# Patient Record
Sex: Female | Born: 1940 | Race: White | Hispanic: No | Smoking: Never smoker
Health system: Southern US, Community
[De-identification: ages and names within clinical notes are randomized; demographics above are authoritative.]

## PROBLEM LIST (undated history)

## (undated) DIAGNOSIS — M1990 Unspecified osteoarthritis, unspecified site: Secondary | ICD-10-CM

## (undated) DIAGNOSIS — M48061 Spinal stenosis, lumbar region without neurogenic claudication: Secondary | ICD-10-CM

## (undated) DIAGNOSIS — K449 Diaphragmatic hernia without obstruction or gangrene: Secondary | ICD-10-CM

## (undated) DIAGNOSIS — I10 Essential (primary) hypertension: Secondary | ICD-10-CM

## (undated) DIAGNOSIS — E119 Type 2 diabetes mellitus without complications: Secondary | ICD-10-CM

## (undated) DIAGNOSIS — M316 Other giant cell arteritis: Secondary | ICD-10-CM

## (undated) DIAGNOSIS — E785 Hyperlipidemia, unspecified: Secondary | ICD-10-CM

## (undated) DIAGNOSIS — Z972 Presence of dental prosthetic device (complete) (partial): Secondary | ICD-10-CM

## (undated) DIAGNOSIS — M810 Age-related osteoporosis without current pathological fracture: Secondary | ICD-10-CM

## (undated) DIAGNOSIS — K219 Gastro-esophageal reflux disease without esophagitis: Secondary | ICD-10-CM

## (undated) DIAGNOSIS — L0390 Cellulitis, unspecified: Secondary | ICD-10-CM

## (undated) DIAGNOSIS — G709 Myoneural disorder, unspecified: Secondary | ICD-10-CM

## (undated) DIAGNOSIS — I639 Cerebral infarction, unspecified: Secondary | ICD-10-CM

## (undated) DIAGNOSIS — D649 Anemia, unspecified: Secondary | ICD-10-CM

## (undated) DIAGNOSIS — L719 Rosacea, unspecified: Secondary | ICD-10-CM

## (undated) DIAGNOSIS — M109 Gout, unspecified: Secondary | ICD-10-CM

## (undated) HISTORY — PX: URETER SURGERY: SHX823

## (undated) HISTORY — PX: FOOT SURGERY: SHX648

## (undated) HISTORY — PX: HEMORRHOID SURGERY: SHX153

## (undated) HISTORY — PX: KNEE ARTHROSCOPY: SHX127

## (undated) HISTORY — PX: BREAST BIOPSY: SHX20

## (undated) HISTORY — PX: NOSE SURGERY: SHX723

## (undated) HISTORY — DX: Diaphragmatic hernia without obstruction or gangrene: K44.9

## (undated) HISTORY — PX: APPENDECTOMY: SHX54

## (undated) HISTORY — PX: TONSILLECTOMY: SUR1361

## (undated) HISTORY — PX: BACK SURGERY: SHX140

## (undated) HISTORY — PX: ABDOMINAL HYSTERECTOMY: SHX81

---

## 2005-01-07 ENCOUNTER — Emergency Department: Payer: Self-pay | Admitting: Emergency Medicine

## 2005-01-31 ENCOUNTER — Ambulatory Visit: Payer: Self-pay | Admitting: Unknown Physician Specialty

## 2005-05-10 ENCOUNTER — Ambulatory Visit: Payer: Self-pay | Admitting: Family Medicine

## 2006-05-23 ENCOUNTER — Ambulatory Visit: Payer: Self-pay | Admitting: Family Medicine

## 2007-07-03 ENCOUNTER — Ambulatory Visit: Payer: Self-pay | Admitting: Family Medicine

## 2008-08-12 ENCOUNTER — Ambulatory Visit: Payer: Self-pay | Admitting: Family Medicine

## 2009-06-03 ENCOUNTER — Ambulatory Visit: Payer: Self-pay | Admitting: Family Medicine

## 2009-08-29 ENCOUNTER — Emergency Department: Payer: Self-pay | Admitting: Unknown Physician Specialty

## 2009-08-29 ENCOUNTER — Ambulatory Visit: Payer: Self-pay | Admitting: Family Medicine

## 2009-09-09 ENCOUNTER — Ambulatory Visit: Payer: Self-pay | Admitting: Family Medicine

## 2010-09-15 ENCOUNTER — Ambulatory Visit: Payer: Self-pay | Admitting: Family Medicine

## 2011-10-31 ENCOUNTER — Ambulatory Visit: Payer: Self-pay | Admitting: Family Medicine

## 2012-01-03 DIAGNOSIS — E785 Hyperlipidemia, unspecified: Secondary | ICD-10-CM | POA: Insufficient documentation

## 2012-01-03 DIAGNOSIS — M316 Other giant cell arteritis: Secondary | ICD-10-CM | POA: Insufficient documentation

## 2012-01-03 DIAGNOSIS — I639 Cerebral infarction, unspecified: Secondary | ICD-10-CM | POA: Insufficient documentation

## 2012-05-03 ENCOUNTER — Ambulatory Visit: Payer: Self-pay | Admitting: General Practice

## 2012-05-24 ENCOUNTER — Ambulatory Visit: Payer: Self-pay | Admitting: Orthopedic Surgery

## 2012-05-24 DIAGNOSIS — I10 Essential (primary) hypertension: Secondary | ICD-10-CM

## 2012-05-24 LAB — BASIC METABOLIC PANEL
Anion Gap: 7 (ref 7–16)
BUN: 18 mg/dL (ref 7–18)
Co2: 27 mmol/L (ref 21–32)
Creatinine: 0.83 mg/dL (ref 0.60–1.30)
EGFR (African American): >60
EGFR (Non-African Amer.): >60
Glucose: 160 mg/dL — ABNORMAL HIGH (ref 65–99)
Osmolality: 287 (ref 275–301)
Potassium: 3.7 mmol/L (ref 3.5–5.1)
Sodium: 141 mmol/L (ref 136–145)

## 2012-05-24 LAB — CBC WITH DIFFERENTIAL/PLATELET
Basophil #: 0 10*3/uL (ref 0.0–0.1)
Eosinophil #: 0 10*3/uL (ref 0.0–0.7)
HCT: 36.7 % (ref 35.0–47.0)
HGB: 12.4 g/dL (ref 12.0–16.0)
Lymphocyte %: 17.4 %
MCH: 30.3 pg (ref 26.0–34.0)
MCHC: 33.7 g/dL (ref 32.0–36.0)
MCV: 90 fL (ref 80–100)
Monocyte %: 6.6 %
Neutrophil %: 75.2 %
RBC: 4.09 10*6/uL (ref 3.80–5.20)

## 2012-05-31 ENCOUNTER — Ambulatory Visit: Payer: Self-pay | Admitting: Orthopedic Surgery

## 2012-07-23 DIAGNOSIS — E119 Type 2 diabetes mellitus without complications: Secondary | ICD-10-CM | POA: Insufficient documentation

## 2012-07-23 DIAGNOSIS — I10 Essential (primary) hypertension: Secondary | ICD-10-CM | POA: Insufficient documentation

## 2012-07-23 DIAGNOSIS — E118 Type 2 diabetes mellitus with unspecified complications: Secondary | ICD-10-CM | POA: Insufficient documentation

## 2012-10-02 ENCOUNTER — Inpatient Hospital Stay: Payer: Self-pay | Admitting: Internal Medicine

## 2012-10-02 LAB — BASIC METABOLIC PANEL
BUN: 15 mg/dL (ref 7–18)
Calcium, Total: 9.1 mg/dL (ref 8.5–10.1)
Chloride: 105 mmol/L (ref 98–107)
Co2: 22 mmol/L (ref 21–32)
Creatinine: 0.84 mg/dL (ref 0.60–1.30)
EGFR (African American): >60
EGFR (Non-African Amer.): >60
Glucose: 149 mg/dL — ABNORMAL HIGH (ref 65–99)
Potassium: 4.1 mmol/L (ref 3.5–5.1)

## 2012-10-02 LAB — CBC
HCT: 37.5 % (ref 35.0–47.0)
Platelet: 276 10*3/uL (ref 150–440)
RBC: 4.27 10*6/uL (ref 3.80–5.20)
RDW: 16.1 % — ABNORMAL HIGH (ref 11.5–14.5)
WBC: 8.1 10*3/uL (ref 3.6–11.0)

## 2012-10-02 LAB — MAGNESIUM: Magnesium: 2 mg/dL

## 2012-10-03 LAB — CBC WITH DIFFERENTIAL/PLATELET
Basophil %: 0.1 %
Eosinophil #: 0 10*3/uL (ref 0.0–0.7)
Eosinophil %: 0 %
MCHC: 32.6 g/dL (ref 32.0–36.0)
Platelet: 309 10*3/uL (ref 150–440)
RBC: 4.16 10*6/uL (ref 3.80–5.20)
WBC: 13.4 10*3/uL — ABNORMAL HIGH (ref 3.6–11.0)

## 2012-10-03 LAB — BASIC METABOLIC PANEL
Anion Gap: 8 (ref 7–16)
BUN: 25 mg/dL — ABNORMAL HIGH (ref 7–18)
Chloride: 102 mmol/L (ref 98–107)
Co2: 26 mmol/L (ref 21–32)
EGFR (African American): >60
Osmolality: 280 (ref 275–301)
Potassium: 4.4 mmol/L (ref 3.5–5.1)

## 2012-10-04 LAB — CBC WITH DIFFERENTIAL/PLATELET
Eosinophil #: 0 10*3/uL (ref 0.0–0.7)
Eosinophil %: 0.1 %
HCT: 34.9 % — ABNORMAL LOW (ref 35.0–47.0)
HGB: 11.2 g/dL — ABNORMAL LOW (ref 12.0–16.0)
Lymphocyte #: 1.6 10*3/uL (ref 1.0–3.6)
Lymphocyte %: 14.1 %
MCHC: 32.2 g/dL (ref 32.0–36.0)
Monocyte #: 0.7 x10 3/mm (ref 0.2–0.9)
Monocyte %: 6.1 %
Neutrophil %: 79.5 %
Platelet: 269 10*3/uL (ref 150–440)
RBC: 3.88 10*6/uL (ref 3.80–5.20)
RDW: 16.4 % — ABNORMAL HIGH (ref 11.5–14.5)
WBC: 11.3 10*3/uL — ABNORMAL HIGH (ref 3.6–11.0)

## 2012-10-04 LAB — BASIC METABOLIC PANEL
Anion Gap: 9 (ref 7–16)
Chloride: 107 mmol/L (ref 98–107)
Osmolality: 284 (ref 275–301)
Potassium: 3.7 mmol/L (ref 3.5–5.1)
Sodium: 141 mmol/L (ref 136–145)

## 2012-10-05 LAB — CBC WITH DIFFERENTIAL/PLATELET
Basophil #: 0 10*3/uL (ref 0.0–0.1)
Basophil %: 0 %
Eosinophil %: 0 %
HGB: 12.1 g/dL (ref 12.0–16.0)
Lymphocyte #: 0.5 10*3/uL — ABNORMAL LOW (ref 1.0–3.6)
Lymphocyte %: 4.4 %
MCH: 30.1 pg (ref 26.0–34.0)
MCHC: 33.7 g/dL (ref 32.0–36.0)
Monocyte %: 2.6 %
Neutrophil #: 10.9 10*3/uL — ABNORMAL HIGH (ref 1.4–6.5)
Platelet: 275 10*3/uL (ref 150–440)
RDW: 16.2 % — ABNORMAL HIGH (ref 11.5–14.5)
WBC: 11.7 10*3/uL — ABNORMAL HIGH (ref 3.6–11.0)

## 2012-10-05 LAB — BASIC METABOLIC PANEL
BUN: 14 mg/dL (ref 7–18)
Calcium, Total: 8.6 mg/dL (ref 8.5–10.1)
Co2: 25 mmol/L (ref 21–32)
Creatinine: 0.67 mg/dL (ref 0.60–1.30)
EGFR (African American): >60
Glucose: 128 mg/dL — ABNORMAL HIGH (ref 65–99)
Osmolality: 282 (ref 275–301)
Potassium: 3.5 mmol/L (ref 3.5–5.1)

## 2012-10-07 LAB — BASIC METABOLIC PANEL
Anion Gap: 6 — ABNORMAL LOW (ref 7–16)
Calcium, Total: 8.2 mg/dL — ABNORMAL LOW (ref 8.5–10.1)
Co2: 25 mmol/L (ref 21–32)
Creatinine: 0.64 mg/dL (ref 0.60–1.30)
EGFR (African American): >60
EGFR (Non-African Amer.): >60
Glucose: 117 mg/dL — ABNORMAL HIGH (ref 65–99)
Osmolality: 284 (ref 275–301)
Potassium: 4.1 mmol/L (ref 3.5–5.1)
Sodium: 142 mmol/L (ref 136–145)

## 2012-10-07 LAB — CBC WITH DIFFERENTIAL/PLATELET
Basophil #: 0 10*3/uL (ref 0.0–0.1)
Eosinophil #: 0 10*3/uL (ref 0.0–0.7)
Eosinophil %: 0.1 %
HCT: 34.4 % — ABNORMAL LOW (ref 35.0–47.0)
Lymphocyte #: 0.9 10*3/uL — ABNORMAL LOW (ref 1.0–3.6)
MCH: 29.5 pg (ref 26.0–34.0)
MCV: 89 fL (ref 80–100)
Monocyte #: 0.6 x10 3/mm (ref 0.2–0.9)
Monocyte %: 6.1 %
Neutrophil #: 8 10*3/uL — ABNORMAL HIGH (ref 1.4–6.5)
Platelet: 238 10*3/uL (ref 150–440)
WBC: 9.5 10*3/uL (ref 3.6–11.0)

## 2012-10-24 ENCOUNTER — Ambulatory Visit: Payer: Self-pay | Admitting: Pain Medicine

## 2012-10-30 ENCOUNTER — Ambulatory Visit: Payer: Self-pay | Admitting: Pain Medicine

## 2012-11-15 ENCOUNTER — Ambulatory Visit: Payer: Self-pay | Admitting: Pain Medicine

## 2012-11-21 ENCOUNTER — Ambulatory Visit: Payer: Self-pay | Admitting: Internal Medicine

## 2013-04-18 DIAGNOSIS — M48061 Spinal stenosis, lumbar region without neurogenic claudication: Secondary | ICD-10-CM | POA: Insufficient documentation

## 2013-04-23 HISTORY — PX: LUMBAR LAMINECTOMY: SHX95

## 2013-06-12 DIAGNOSIS — Z9889 Other specified postprocedural states: Secondary | ICD-10-CM | POA: Insufficient documentation

## 2013-11-25 ENCOUNTER — Ambulatory Visit: Payer: Self-pay | Admitting: Internal Medicine

## 2014-01-27 ENCOUNTER — Ambulatory Visit: Payer: Self-pay | Admitting: Unknown Physician Specialty

## 2014-01-28 LAB — PATHOLOGY REPORT

## 2014-04-10 DIAGNOSIS — D649 Anemia, unspecified: Secondary | ICD-10-CM | POA: Insufficient documentation

## 2014-08-14 ENCOUNTER — Ambulatory Visit: Payer: Self-pay | Admitting: Podiatry

## 2014-12-30 NOTE — Op Note (Signed)
PATIENT NAME:  Wanda Monroe, Wanda Monroe MR#:  [id number] DATE OF BIRTH:  07/05/1941  DATE OF PROCEDURE:  05/31/2012  PREOPERATIVE DIAGNOSES: Right knee osteoarthritis, lateral meniscus tear.   POSTOPERATIVE DIAGNOSES: Right knee osteoarthritis, lateral meniscus tear.   PROCEDURES: Right knee arthroscopy, partial medial and lateral meniscectomy.   SURGEON: Laurene Footman, MD  ANESTHESIA: General.    DESCRIPTION OF PROCEDURE: Patient was brought to the Operating Room and after adequate anesthesia was obtained, the right leg was prepped and draped in the usual sterile fashion with a tourniquet and arthroscopic legholder applied although the tourniquet was not required. After patient identification and timeout procedure were completed an inferolateral portal was made and the arthroscope was introduced. Initial inspection revealed mild patellofemoral degenerative change with good patellofemoral tracking. Suprapatellar pouch was normal, just some moderate synovitis. Going in the gutters there were no loose bodies. Coming around medially, an inferomedial portal was made. Initially the articular cartilage had areas of partial thickness cartilage loss. There is no fissuring down to the bone, however, and most of the articular cartilage appeared relatively normal. There was abnormality to the middle third of the meniscus and on probing there was in fact a tear to the middle third that appeared old. This was subsequently debrided with a meniscal punch and shaver and smoothed out with the ArthroCare wand to a stable margin. The anterior cruciate ligament was intact and going to the lateral compartment, there was posterolateral degenerative changes on the tibia with approximately 10 mm area of exposed bone under the meniscus tear. There was a very extensive meniscus tear with horizontal and vertical components involving most of the posterior third and middle thirds. The meniscal punch and shaver were used followed by the  ArthroCare wand removing most of this posterior third and large portion of the middle thirds of the meniscus. After addressing the meniscal pathology the knee was irrigated until clear. All instrumentation was withdrawn. The wounds were closed with simple interrupted 4-0 nylon skin suture. Xeroform, 4 x 4's, Webril, and Ace wrap were applied after infiltration of the portals with a total of 20 mL of 0.5% Sensorcaine with epinephrine.   CONDITION: To recovery room stable.   COMPLICATIONS: None.   SPECIMENS: None.  ____________________________ Laurene Footman, MD mjm:cms D: 05/31/2012 20:08:00 ET T: 06/01/2012 10:00:12 ET JOB#: [id number]  cc: Laurene Footman, MD, <Dictator> Laurene Footman MD ELECTRONICALLY SIGNED 06/01/2012 12:33

## 2015-01-02 NOTE — Discharge Summary (Signed)
PATIENT NAME:  Wanda Monroe, CROSSIN MR#:  [id number] DATE OF BIRTH:  [birth year]  DATE OF ADMISSION:  10/02/2012 DATE OF DISCHARGE:  10/08/2012  REASON FOR ADMISSION:  Acute onset back pain.   HISTORY OF PRESENT ILLNESS:  Please see the dictated history of present illness done by Dr. Bridgette Habermann on 10/02/2012.   PAST MEDICAL HISTORY: 1.  Rheumatoid arthritis.  2.  Osteoarthritis.  3.  Previous stroke.  4.  Benign hypertension.  5.  Hyperlipidemia.  6.  History of temporal arteritis.   MEDICATIONS ON ADMISSION:  Please see admission note.   ALLERGIES:  CODEINE.  SOCIAL HISTORY:  As per admission note.   FAMILY HISTORY:  As per admission note.   REVIEW OF SYSTEMS:  As per admission note.   PHYSICAL EXAMINATION:  GENERAL:  The patient was in no acute distress.  VITAL SIGNS:  Are stable and she was afebrile.  HEENT:  Was unremarkable.  NECK:  Was supple without JVD.  LUNGS:  Are clear.  CARDIAC:  Revealed a regular rate and rhythm.  Normal S1, S2.  No significant murmurs.  ABDOMEN:  Soft, nontender, with normoactive bowel sounds.  No organomegaly or masses were appreciated.  No hernias were noted.  EXTREMITIES:  Without clubbing, cyanosis, edema.  Pulses were 2+ bilaterally.  SKIN:  Warm and dry without rash or lesions.  NEUROLOGIC:  Cranial nerves II-XII grossly intact.  Deep tendon reflexes were symmetric.  Motor and sensory exam is nonfocal.  PSYCHIATRIC:  Revealed a patient who was alert and oriented to person, place and time.  She was cooperative and used good judgment.   HOSPITAL COURSE:  The patient was admitted with severe back pain radiating down the left leg with paresthesias and numbness of the left leg.  She did have a positive straight leg raise on exam.  She was seen in consultation by orthopedics who recommended an MRI of the back.  The MRI was done and did reveal a bulging disk.  She was subsequently seen by pain management and an epidural injection was performed.  The  patient was seen in consultation by physical therapy.  She was initially given IV medications for pain management, but was subsequently improved after the epidural injection and was switched to oral medications.  She was ambulating with a walker with improvement of her pain.  She still had some paresthesias.  By 10/08/2012, the patient was stable and ready for discharge.   DISCHARGE DIAGNOSES: 1.  Degenerative disk disease with lumbar radiculopathy.  2.  Lower extremity paresthesias with weakness.  3.  Rheumatoid arthritis.  4.  Osteoarthritis.  5.  Benign hypertension.  6.  Hyperlipidemia.   DISCHARGE MEDICATIONS: 1.  Amlodipine 2.5 mg by mouth daily.  2.  Atenolol 25 mg by mouth at bedtime.  3.  Folic acid 466 mg by mouth daily.  4.  Aspirin 81 mg by mouth daily.  5.  Klor-Con 10 mEq by mouth daily.  6.  Zantac 150 mg by mouth twice daily.  7.  Lipitor 80 mg by mouth at bedtime.  8.  Citracal D 1 by mouth daily.  9.  Biotin 5000 mg by mouth daily.  10.  Zoloft 50 mg by mouth daily.  11.  Colestid 1 gram tablets 2 by mouth twice a day.  12.  Tramadol 50 mg by mouth q. 6 hours as needed pain.  13.  Zofran 4 mg by mouth q. 4 hours as needed nausea, vomiting.  14.  Methotrexate as  directed.  15.  Flexeril 10 mg by mouth q. 8 hours as needed.   FOLLOW-UP PLANS AND APPOINTMENTS:  The patient will be followed by home health.  She is on a low sodium diet.  She will follow with me in 1 to 2 weeks.  Sooner if needed.     ____________________________ Leonie Douglas Doy Hutching, MD jds:ea D: 10/19/2012 13:47:31 ET T: 10/20/2012 01:49:32 ET JOB#: [id number]  cc: Leonie Douglas. Doy Hutching, MD, <Dictator> JEFFREY Lennice Sites MD ELECTRONICALLY SIGNED 10/21/2012 14:49

## 2015-01-02 NOTE — H&P (Signed)
PATIENT NAME:  Wanda Monroe, Wanda Monroe MR#:  [id number] DATE OF BIRTH:  1941-04-24  DATE OF ADMISSION:  10/02/2012  REFERRING PHYSICIAN:  Dr. _____.   PRIMARY CARE PHYSICIAN:  Leonie Douglas. Doy Hutching, MD, at the [facility].   RHEUMATOLOGIST:  Cheral Almas., MD, at [facility].   CHIEF COMPLAINT:  Acute onset back pain.   HISTORY OF PRESENT ILLNESS:  The patient is a pleasant 74 year old Caucasian female with a history of temporal arteritis and stroke x 2, hyperlipidemia and severe DJD who has had acute onset back pain shooting to the leg starting this morning acutely. The patient was at her baseline taking a shower and as she was bending down to dry to left leg had acute onset of left lower extremity shooting pain. The patient had profound weakness and had difficulty moving her leg. Of note, she had some x-rays of the back done in the last 6 weeks with Dr. Jefm Bryant, her rheumatologist, who was told that she has severe degenerative disk disease. Here in the hospital, she had x-rays of the lumbar spine and they were negative for fractures as well as the hip. She has positive straight leg raise. She also has not responded well to Decadron and morphine. Hospitalist services were contacted for further evaluation and management for continued pain unresponsive to narcotics and steroids.   PAST MEDICAL HISTORY:  History of stroke x 2, DJD, hyperlipidemia, hypertension, osteoarthritis, rheumatoid arthritis, history of temporal arteritis.   SOCIAL HISTORY:  No tobacco, alcohol or drug use. Lives with her husband.   FAMILY HISTORY:  Dad with stroke and diabetes. Mom with hyperlipidemia and Alzheimer's. Brother with diabetes.   ALLERGIES:  CODEINE.   OUTPATIENT MEDICATIONS:  Amlodipine 2.5 mg daily, aspirin 81 mg daily, atenolol 25 mg 1 tab daily, atorvastatin 80 mg daily, Biotin 5000 mg once a day, Citrucel 1 tab daily, Colestid 1 gram 2 tabs 2 times a day, folic acid 496 mg daily, Klor-Con 10 mEq 1  tab daily, methotrexate 10 mg on Wednesdays, prednisone 2.5 mg daily, ranitidine 150 mg tablets 1 to 2 tabs p.r.n., sertraline 50 mg daily.   REVIEW OF SYSTEMS:   CONSTITUTIONAL: Denies fever or fatigue, weakness globally or weight changes.  EYES: Has temporal arteritis and currently no double vision, pain, redness.  ENT: No tinnitus, hearing loss or snoring.  RESPIRATORY: No cough, wheezing, shortness of breath or hemoptysis.  CARDIOVASCULAR: No chest pain, orthopnea, edema or dyspnea on exertion.  GASTROINTESTINAL: No nausea, vomiting, diarrhea or abdominal pain.  GENITOURINARY: Denies dysuria or hematuria.  HEMATOLOGIC/LYMPHATICS: No anemia or easy bruising.  SKIN: No rashes. Of note, the patient did have some cuts on her lower extremity from using a razor 2 days ago.  MUSCULOSKELETAL: Back pain shooting to the left ankle area with off and on numbness. History of stroke in the past.  PSYCHIATRIC: No anxiety or insomnia.   PHYSICAL EXAMINATION: VITAL SIGNS: Temperature 97, pulse rate 69 on arrival, respiratory rate 20, blood pressure 157/77, O2 sat 100% on room air.  GENERAL: The patient is a pleasant Caucasian female lying in bed in mild distress.  HEENT: Normocephalic, atraumatic. Pupils are equal and reactive. Anicteric sclerae. Extraocular muscles intact. No tenderness to palpation of temporal area. Moist mucous membranes.  NECK: Supple. No thyroid tenderness, cervical adenopathy.  CARDIOVASCULAR: S1, S2 regular rate and rhythm. No murmurs, rubs or gallops.  LUNGS: Clear to auscultation. No wheezing or rhonchi.  ABDOMEN: Soft, nontender, nondistended. Positive bowel sounds in all quadrants.  No organomegaly appreciated.  EXTREMITIES: No significant lower extremity edema. Pulses are palpable, PT and DP in the left leg. Skin is warm and moist.  NEUROLOGIC: Cranial nerves II through XII are grossly intact. Strength is 5 out of 5 in the upper extremities as well as the right lower extremity.  In the left lower extremity, I cannot do a full exam secondary to pain, but the patient does have a positive straight leg raise on the left. Sensation is intact to light touch.  PSYCHIATRIC: Awake, alert, oriented x 3.   DIAGNOSTIC DATA:  Hip x-rays no acute abnormalities. There are degenerative changes present about the left hip. Sclerotic densities are noted in the pelvis consistent with phleboliths. Lumbar spine AP and lateral is showing no acute abnormality. There are degenerative changes of the lumbar spine.   ASSESSMENT AND PLAN:  A pleasant 74 year old Caucasian female with history of rheumatoid arthritis, osteoarthritis, degenerative disk disease, hyperlipidemia, stroke x 2, temporal arteritis who presents with acute onset radicular back pain shooting to the left leg with intact pulses. She also has a positive leg raise. The patient likely has acute onset sciatic type of a pain radicular in nature. I will obtain an MRI of the back. She has had no significant improvement with morphine. Therefore, we would admit her for observation. We will start her on p.r.n. Tylenol and morphine, give her a dose of Solu-Medrol and Valium and start Flexeril p.r.n. We did obtain a physical therapy consult as well as orthopedic consult. We will see how she does through the hospitalization. She does have a history of chronic back pain and has had recent x-rays as an outpatient as well. As far as her rheumatoid arthritis, we will continue the methotrexate and continue prednisone for her temporal arteritis. We will check a hemoglobin A1c for her diet-controlled diabetes and see how the sugars are. There are no basic labs at this point and we will obtain a BMP and a CBC. We will continue her statin and aspirin for her secondary stroke prevention. The patient is a full code.   TOTAL TIME SPENT:  50 minutes.    ____________________________ Vivien Presto, MD sa:si D: 10/02/2012 16:26:00 ET T: 10/02/2012 17:11:24  ET JOB#: [id number]  cc: Vivien Presto, MD, <Dictator> Leonie Douglas. Doy Hutching, MD Emmaline Kluver., MD  Vivien Presto MD ELECTRONICALLY SIGNED 10/18/2012 20:06

## 2015-01-02 NOTE — Consult Note (Signed)
Brief Consult Note: Diagnosis: Left sided sciatic pain.   Patient was seen by consultant.   Consult note dictated.   Recommend further assessment or treatment.   Orders entered.   Discussed with Attending MD.   Comments: Discussed with Dr. Doy Hutching.  MRI of lumbar spine showing disc herniation pusing on L5 nerve root on the left which correlates with her symptoms.  Patient has pain, paresthsesias and EHL weakness on the left.  I have ordered a consult from Dr. Dossie Arbour to see if the patient could undergo a corticosteroid injection for these symtpoms.  Will await Dr. Adalberto Cole assessment and recommendations.  Patient and Dr. Doy Hutching understand and agree with this plan.  Electronic Signatures: Thornton Park (MD)  (Signed 22-Jan-14 18:11)  Authored: Brief Consult Note   Last Updated: 22-Jan-14 18:11 by Thornton Park (MD)

## 2015-01-07 ENCOUNTER — Other Ambulatory Visit: Payer: Self-pay

## 2015-01-07 DIAGNOSIS — Z1231 Encounter for screening mammogram for malignant neoplasm of breast: Secondary | ICD-10-CM

## 2015-01-15 ENCOUNTER — Encounter: Payer: Self-pay | Admitting: *Deleted

## 2015-01-16 ENCOUNTER — Ambulatory Visit
Admission: RE | Admit: 2015-01-16 | Discharge: 2015-01-16 | Disposition: A | Discharge status: Home / Self Care | Payer: PPO | Source: Ambulatory Visit | Attending: Internal Medicine | Admitting: Internal Medicine

## 2015-01-16 DIAGNOSIS — Z1231 Encounter for screening mammogram for malignant neoplasm of breast: Secondary | ICD-10-CM | POA: Diagnosis present

## 2015-01-20 NOTE — Discharge Instructions (Signed)
[facility] °[facility] ° °POST OPERATIVE INSTRUCTIONS FOR DR. TROXLER AND DR. FOWLER °[facility] ° ° °1. Take your medication as prescribed.  Pain medication should be taken only as needed. ° °2. Keep the dressing clean, dry and intact. ° °3. Keep your foot elevated above the heart level for the first 48 hours. ° °4. Walking to the bathroom and brief periods of walking are acceptable, unless we have instructed you to be non-weight bearing. ° °5. Always wear your post-op shoe when walking.  Always use your crutches if you are to be non-weight bearing. ° °6. Do not take a shower. Baths are permissible as long as the foot is kept out of the water.  ° °7. Every hour you are awake:  °- Bend your knee 15 times. °- Flex foot 15 times °- Massage calf 15 times ° °8. Call [facility] ([phone]) if any of the following problems occur: °- You develop a temperature or fever. °- The bandage becomes saturated with blood. °- Medication does not stop your pain. °- Injury of the foot occurs. °- Any symptoms of infection including redness, odor, or red streaks running from wound. °-  ° °General Anesthesia, Care After °Refer to this sheet in the next few weeks. These instructions provide you with information on caring for yourself after your procedure. Your health care provider may also give you more specific instructions. Your treatment has been planned according to current medical practices, but problems sometimes occur. Call your health care provider if you have any problems or questions after your procedure. °WHAT TO EXPECT AFTER THE PROCEDURE °After the procedure, it is typical to experience: °· Sleepiness. °· Nausea and vomiting. °HOME CARE INSTRUCTIONS °· For the first 24 hours after general anesthesia: °¨ Have a responsible person with you. °¨ Do not drive a car. If you are alone, do not take public transportation. °¨ Do not drink alcohol. °¨ Do not take medicine  that has not been prescribed by your health care provider. °¨ Do not sign important papers or make important decisions. °¨ You may resume a normal diet and activities as directed by your health care provider. °· Change bandages (dressings) as directed. °· If you have questions or problems that seem related to general anesthesia, call the hospital and ask for the anesthetist or anesthesiologist on call. °SEEK MEDICAL CARE IF: °· You have nausea and vomiting that continue the day after anesthesia. °· You develop a rash. °SEEK IMMEDIATE MEDICAL CARE IF:  °· You have difficulty breathing. °· You have chest pain. °· You have any allergic problems. °Document Released: 12/05/2000 Document Revised: 09/03/2013 Document Reviewed: 03/14/2013 °Exit[employer]® Patient Information ©2015 [employer]. This information is not intended to replace advice given to you by your health care provider. Make sure you discuss any questions you have with your health care provider. ° °

## 2015-01-22 ENCOUNTER — Ambulatory Visit
Admission: RE | Admit: 2015-01-22 | Discharge: 2015-01-22 | Disposition: A | Discharge status: Home / Self Care | Payer: PPO | Source: Ambulatory Visit | Attending: Podiatry | Admitting: Podiatry

## 2015-01-22 ENCOUNTER — Encounter: Payer: Self-pay | Admitting: Anesthesiology

## 2015-01-22 ENCOUNTER — Encounter
Admission: RE | Disposition: A | Discharge status: Home / Self Care | Payer: Self-pay | Source: Ambulatory Visit | Attending: Podiatry

## 2015-01-22 ENCOUNTER — Ambulatory Visit: Payer: PPO | Admitting: Anesthesiology

## 2015-01-22 DIAGNOSIS — M24575 Contracture, left foot: Secondary | ICD-10-CM | POA: Diagnosis not present

## 2015-01-22 DIAGNOSIS — M069 Rheumatoid arthritis, unspecified: Secondary | ICD-10-CM | POA: Diagnosis not present

## 2015-01-22 DIAGNOSIS — E119 Type 2 diabetes mellitus without complications: Secondary | ICD-10-CM | POA: Insufficient documentation

## 2015-01-22 DIAGNOSIS — F419 Anxiety disorder, unspecified: Secondary | ICD-10-CM | POA: Diagnosis not present

## 2015-01-22 DIAGNOSIS — Z472 Encounter for removal of internal fixation device: Secondary | ICD-10-CM | POA: Insufficient documentation

## 2015-01-22 DIAGNOSIS — K219 Gastro-esophageal reflux disease without esophagitis: Secondary | ICD-10-CM | POA: Insufficient documentation

## 2015-01-22 DIAGNOSIS — I10 Essential (primary) hypertension: Secondary | ICD-10-CM | POA: Insufficient documentation

## 2015-01-22 DIAGNOSIS — M4800 Spinal stenosis, site unspecified: Secondary | ICD-10-CM | POA: Diagnosis not present

## 2015-01-22 DIAGNOSIS — M316 Other giant cell arteritis: Secondary | ICD-10-CM | POA: Insufficient documentation

## 2015-01-22 DIAGNOSIS — M2012 Hallux valgus (acquired), left foot: Secondary | ICD-10-CM | POA: Diagnosis present

## 2015-01-22 DIAGNOSIS — G629 Polyneuropathy, unspecified: Secondary | ICD-10-CM | POA: Diagnosis not present

## 2015-01-22 DIAGNOSIS — M109 Gout, unspecified: Secondary | ICD-10-CM | POA: Diagnosis not present

## 2015-01-22 DIAGNOSIS — I69354 Hemiplegia and hemiparesis following cerebral infarction affecting left non-dominant side: Secondary | ICD-10-CM | POA: Insufficient documentation

## 2015-01-22 HISTORY — DX: Presence of dental prosthetic device (complete) (partial): Z97.2

## 2015-01-22 HISTORY — DX: Type 2 diabetes mellitus without complications: E11.9

## 2015-01-22 HISTORY — DX: Age-related osteoporosis without current pathological fracture: M81.0

## 2015-01-22 HISTORY — DX: Anemia, unspecified: D64.9

## 2015-01-22 HISTORY — DX: Essential (primary) hypertension: I10

## 2015-01-22 HISTORY — DX: Cerebral infarction, unspecified: I63.9

## 2015-01-22 HISTORY — DX: Myoneural disorder, unspecified: G70.9

## 2015-01-22 HISTORY — PX: CAPSULOTOMY METATARSOPHALANGEAL: SHX6614

## 2015-01-22 HISTORY — DX: Gout, unspecified: M10.9

## 2015-01-22 HISTORY — DX: Spinal stenosis, lumbar region without neurogenic claudication: M48.061

## 2015-01-22 HISTORY — DX: Gastro-esophageal reflux disease without esophagitis: K21.9

## 2015-01-22 HISTORY — DX: Unspecified osteoarthritis, unspecified site: M19.90

## 2015-01-22 HISTORY — PX: HALLUX VALGUS AKIN: SHX6622

## 2015-01-22 LAB — GLUCOSE, CAPILLARY
Glucose-Capillary: 102 mg/dL — ABNORMAL HIGH (ref 65–99)
Glucose-Capillary: 93 mg/dL (ref 65–99)

## 2015-01-22 SURGERY — CAPSULOTOMY, MTP JOINT
Anesthesia: Monitor Anesthesia Care | Site: Foot | Laterality: Left

## 2015-01-22 SURGERY — CAPSULOTOMY, MTP JOINT
Anesthesia: IV Sedation (MBSC Only) | Laterality: Left

## 2015-01-22 MED ORDER — LIDOCAINE HCL (CARDIAC) 20 MG/ML IV SOLN
INTRAVENOUS | Status: DC | PRN
  Administered 2015-01-22: 50 mg via INTRAVENOUS

## 2015-01-22 MED ORDER — PROPOFOL INFUSION 10 MG/ML OPTIME
INTRAVENOUS | Status: DC | PRN
  Administered 2015-01-22: 50 ug/kg/min via INTRAVENOUS

## 2015-01-22 MED ORDER — POVIDONE-IODINE 7.5 % EX SOLN
Freq: Once | CUTANEOUS | Status: AC
Start: 2015-01-22 — End: 2015-01-22
  Administered 2015-01-22: 07:00:00 via TOPICAL

## 2015-01-22 MED ORDER — MIDAZOLAM HCL 2 MG/2ML IJ SOLN
INTRAMUSCULAR | Status: DC | PRN
  Administered 2015-01-22: 2 mg via INTRAVENOUS

## 2015-01-22 MED ORDER — BUPIVACAINE HCL (PF) 0.5 % IJ SOLN
INTRAMUSCULAR | Status: DC | PRN
Start: 2015-01-22 — End: 2015-01-22
  Administered 2015-01-22: 6 mL
  Administered 2015-01-22: 5 mL

## 2015-01-22 MED ORDER — CEFAZOLIN SODIUM-DEXTROSE 2-3 GM-% IV SOLR
2.0000 g | Freq: Once | INTRAVENOUS | Status: AC
  Administered 2015-01-22: 2 g via INTRAVENOUS

## 2015-01-22 MED ORDER — LIDOCAINE HCL (PF) 1 % IJ SOLN
INTRAMUSCULAR | Status: DC | PRN
  Administered 2015-01-22: 4 mL

## 2015-01-22 MED ORDER — FENTANYL CITRATE (PF) 100 MCG/2ML IJ SOLN
INTRAMUSCULAR | Status: DC | PRN
  Administered 2015-01-22 (×2): 50 ug via INTRAVENOUS

## 2015-01-22 MED ORDER — LACTATED RINGERS IV SOLN
INTRAVENOUS | Status: DC
  Administered 2015-01-22 (×2): via INTRAVENOUS

## 2015-01-22 SURGICAL SUPPLY — 44 items
BANDAGE ELASTIC 4 CLIP NS LF (GAUZE/BANDAGES/DRESSINGS) ×4 IMPLANT
BENZOIN TINCTURE PRP APPL 2/3 (GAUZE/BANDAGES/DRESSINGS) IMPLANT
BLADE MINI RND TIP GREEN BEAV (BLADE) IMPLANT
BLADE SURG 15 STRL LF DISP TIS (BLADE) ×2 IMPLANT
BLADE SURG 15 STRL SS (BLADE) ×2
BNDG ESMARK 4X12 TAN STRL LF (GAUZE/BANDAGES/DRESSINGS) ×4 IMPLANT
BNDG GAUZE 4.5X4.1 6PLY STRL (MISCELLANEOUS) ×4 IMPLANT
BNDG STRETCH 4X75 STRL LF (GAUZE/BANDAGES/DRESSINGS) IMPLANT
CANISTER SUCT 1200ML W/VALVE (MISCELLANEOUS) ×4 IMPLANT
CHLORAPREP W/TINT 26ML (MISCELLANEOUS) ×4 IMPLANT
CLOSURE WOUND 1/4X4 (GAUZE/BANDAGES/DRESSINGS) ×1
GAUZE PETRO XEROFOAM 1X8 (MISCELLANEOUS) ×4 IMPLANT
GAUZE SPONGE 4X4 12PLY STRL (GAUZE/BANDAGES/DRESSINGS) ×4 IMPLANT
GLOVE BIO SURGEON STRL SZ8 (GLOVE) ×4 IMPLANT
GLOVE INDICATOR 8.0 STRL GRN (GLOVE) ×4 IMPLANT
GOWN STRL REUS W/ TWL XL LVL3 (GOWN DISPOSABLE) ×4 IMPLANT
GOWN STRL REUS W/TWL XL LVL3 (GOWN DISPOSABLE) ×4
IV NS 250ML (IV SOLUTION)
IV NS 250ML BAXH (IV SOLUTION) IMPLANT
K-WIRE DBL END TROCAR 6X.045 (WIRE)
K-WIRE DBL END TROCAR 6X.062 (WIRE)
KWIRE DBL END TROCAR 6X.045 (WIRE) IMPLANT
KWIRE DBL END TROCAR 6X.062 (WIRE) IMPLANT
NS IRRIG 500ML POUR BTL (IV SOLUTION) ×4 IMPLANT
PACK EXTREMITY ARMC (MISCELLANEOUS) ×4 IMPLANT
PAD GROUND ADULT SPLIT (MISCELLANEOUS) ×4 IMPLANT
PADDING CAST BLEND 4X4 NS (MISCELLANEOUS) ×4 IMPLANT
RASP SM TEAR CROSS CUT (RASP) IMPLANT
SPLINT CAST 1 STEP 4X30 (MISCELLANEOUS) IMPLANT
SPLINT FAST PLASTER 5X30 (CAST SUPPLIES)
SPLINT PLASTER CAST FAST 5X30 (CAST SUPPLIES) IMPLANT
STAPLE SPR MET 9X9X9 1.5 (Staple) ×4 IMPLANT
STOCKINETTE STRL 6IN 960660 (GAUZE/BANDAGES/DRESSINGS) ×4 IMPLANT
STRAP BODY AND KNEE 60X3 (MISCELLANEOUS) ×4 IMPLANT
STRIP CLOSURE SKIN 1/4X4 (GAUZE/BANDAGES/DRESSINGS) ×3 IMPLANT
SUT ETHILON 5-0 FS-2 18 BLK (SUTURE) ×4 IMPLANT
SUT SILK 2 0 (SUTURE)
SUT SILK 2-0 30XBRD TIE 12 (SUTURE) IMPLANT
SUT VIC AB 3-0 SH 27 (SUTURE) ×2
SUT VIC AB 3-0 SH 27X BRD (SUTURE) ×2 IMPLANT
SUT VIC AB 4-0 FS2 27 (SUTURE) ×4 IMPLANT
TPS BLADE ×4 IMPLANT
WAND TENDON TOPAZ 0 ANGL (MISCELLANEOUS) IMPLANT
k-wire ×4 IMPLANT

## 2015-01-22 NOTE — H&P (Signed)
  H and P has been reviewed and no changes are noted. This will be uploaded at a later date.

## 2015-01-22 NOTE — Op Note (Signed)
Operative note   Surgeon: Albertine Patricia    Assistant: None    Preop diagnosis: 1. Hallux abductovalgus left foot 2. 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 Complications from internal hardware second metatarsal bone left foot    Postop diagnosis: Same    Procedure: 1. Osteotomy proximal phalanx left hallux with staple fixation 2. Capsulotomy with K wire fixation left second metatarsal phalangeal joint 3. Removal of 2 screws from second metatarsal    EBL: Negligible    Anesthesia:IV sedation    Hemostasis: Ankle tourniquet 250 mmHg pressure    Specimen: None    Complications: None    Operative indications: Pain chronic and abnormal position of the digits    Procedure:  Patient was brought into the OR and placed on the operating table in thesupine position. After anesthesia was obtained theleft lower extremity was prepped and draped in usual sterile fashion.  Operative report: This time attention was directed to the dorsum of the left foot. A 4 cm dorsolinear skin incision was made over the dorsum of the second metatarsal phalangeal joint through her previous scar line. Incision was then deepened sharp and blunt dissection. The long and short extensor tendons were identified and separated medially and laterally. An incision was made at this time through the capsular tissue down to bone in a longitudinal fashion going across the metatarsal phalangeal joint. A transverse release of the dorsal capsule and scar tissue was achieved at this timeframe. Noted scar tissue was encountered around the tendon areas. The capsular tissue was then dissected medially and laterally. A sagittal saw blade was used to remove some bony proliferation this also removed bone from the screw heads. The 2 screws which were both 2.2 screws from the met Artis screw sets were then removed. After further bony remodeling a McGlamry elevator was used to break up scar tissue on the plantar aspect of  the joint capsule. There was an capsulae irrigated and checked and the toe was sitting this sit in a much better position and had much better range of motion especially in plantar flexion. At this point a 45 K wire was drilled through the distal portion of the toe through the distal, middle, and proximal phalanx and then into the metatarsal head and shaft while holding the toe in a corrected position. The area was checked with FluoroScan. Position and correction was seen to be good. The area was then copiously irrigated with sterile saline solution. Capsular tissue was closed with 4-0 Vicryl in a continuous stitch as were deep and superficial fascial layers. Skin closed with 4-0 Vicryl and a subcuticular stitch.  At this time attention was directed to the proximal phalanx of the left hallux where a 2.5 cm dorsolinear skin incision was made medial to the long extensor tendon. Incision was then deepened with sharp dissection bleeders clamped and bovied as required. Periosteum over the dorsum of the proximal phalanx was identified at this time and incised longitudinally and reflected mediolaterally. This time an osteotomy was performed in a V fashion with apex lateral base medial with power equipment. The wedge of bone was removed and feathered and the osteotomy was closed and fixated with a bone staple. The area was checked with FluoroScan good position and correction were noted. There was then copiously irrigated with sterile saline periosteum was closed with 4-0 Vicryl. Deep and superficial fascial layers were closed in a continuous stitch also.  At this time, both surgical areas were blocked with 0.5% Marcaine plain and  a sterile compressive dressing was placed across the wounds consisting of Steri-Strips, Xeroform gauze, Kling, and Kerlix. A posterior splint was placed on the left foot and leg in the operating room.    Patient tolerated the procedure and anesthesia well.  Was transported from the OR to the  PACU with all vital signs stable and vascular status intact. To be discharged per routine protocol.  Will follow up in approximately 1 week in the outpatient clinic.

## 2015-01-22 NOTE — Transfer of Care (Signed)
Immediate Anesthesia Transfer of Care Note  Patient: Wanda Monroe  Procedure(s) Performed: Procedure(s) with comments: CAPSULOTOMY METATARSOPHALANGEAL (Left) HALLUX VALGUS AKIN (Left) - left great toe  Patient Location: PACU  Anesthesia Type: MAC  Level of Consciousness: awake, alert  and patient cooperative  Airway and Oxygen Therapy: Patient Spontanous Breathing and Patient connected to supplemental oxygen  Post-op Assessment: Post-op Vital signs reviewed, Patient's Cardiovascular Status Stable, Respiratory Function Stable, Patent Airway and No signs of Nausea or vomiting  Post-op Vital Signs: Reviewed and stable  Complications: Pt complaining that Right upper arm is tender. BP was located on R upper arm. Cuff removed and placed on L arm in PACU. Pt reports relief.

## 2015-01-22 NOTE — Anesthesia Postprocedure Evaluation (Signed)
  Anesthesia Post-op Note  Patient: Wanda Monroe  Procedure(s) Performed: Procedure(s) with comments: CAPSULOTOMY METATARSOPHALANGEAL (Left) HALLUX VALGUS AKIN (Left) - left great toe  Anesthesia type:MAC  Patient location: PACU  Post pain: Pain level controlled  Post assessment: Post-op Vital signs reviewed, Patient's Cardiovascular Status Stable, Respiratory Function Stable, Patent Airway and No signs of Nausea or vomiting  Post vital signs: Reviewed and stable  Last Vitals:  Filed Vitals:   01/22/15 0954  BP: 131/54  Pulse: 70  Temp: 36.7 C  Resp: 13    Level of consciousness: awake, alert  and patient cooperative  Complications: No apparent anesthesia complications

## 2015-01-22 NOTE — Anesthesia Preprocedure Evaluation (Signed)
Anesthesia Evaluation  Patient identified by MRN, date of birth, ID band  Reviewed: Allergy & Precautions, H&P , NPO status , Patient's Chart, lab work & pertinent test results  Airway Mallampati: II  TM Distance: >3 FB Neck ROM: full    Dental  (+) Partial Upper, Partial Lower   Pulmonary    Pulmonary exam normal       Cardiovascular Exercise Tolerance: Good hypertension, Normal cardiovascular examRhythm:regular Rate:Normal  Lipids    Neuro/Psych Anxiety Neuropathy; Temporal arteritis; CVA (2010 > L weak), Residual Symptoms    GI/Hepatic GERD-  Controlled,  Endo/Other  diabetes  Renal/GU      Musculoskeletal  (+) Arthritis -, Rheumatoid disorders,  Gout; spinal stenosis;   Abdominal   Peds  Hematology   Anesthesia Other Findings Ekg: nsr;  Reproductive/Obstetrics                             Anesthesia Physical Anesthesia Plan  ASA: II  Anesthesia Plan: MAC   Post-op Pain Management:    Induction:   Airway Management Planned:   Additional Equipment:   Intra-op Plan:   Post-operative Plan:   Informed Consent: I have reviewed the patients History and Physical, chart, labs and discussed the procedure including the risks, benefits and alternatives for the proposed anesthesia with the patient or authorized representative who has indicated his/her understanding and acceptance.     Plan Discussed with: CRNA  Anesthesia Plan Comments: (Stress dose steroids;)        Anesthesia Quick Evaluation

## 2015-01-22 NOTE — Anesthesia Procedure Notes (Signed)
Procedure Name: MAC Performed by: Mayme Genta Pre-anesthesia Checklist: Patient identified, Emergency Drugs available, Suction available, Patient being monitored and Timeout performed Patient Re-evaluated:Patient Re-evaluated prior to inductionOxygen Delivery Method: Simple face mask Airway Equipment and Method: Oral airway Placement Confirmation: positive ETCO2 and breath sounds checked- equal and bilateral

## 2015-01-23 ENCOUNTER — Encounter: Payer: Self-pay | Admitting: Podiatry

## 2015-01-24 ENCOUNTER — Emergency Department
Admission: EM | Admit: 2015-01-24 | Discharge: 2015-01-24 | Disposition: A | Discharge status: Home / Self Care | Payer: PPO | Attending: Emergency Medicine | Admitting: Emergency Medicine

## 2015-01-24 ENCOUNTER — Emergency Department: Payer: PPO

## 2015-01-24 DIAGNOSIS — Y998 Other external cause status: Secondary | ICD-10-CM | POA: Diagnosis not present

## 2015-01-24 DIAGNOSIS — R52 Pain, unspecified: Secondary | ICD-10-CM

## 2015-01-24 DIAGNOSIS — Y9389 Activity, other specified: Secondary | ICD-10-CM | POA: Insufficient documentation

## 2015-01-24 DIAGNOSIS — Y9289 Other specified places as the place of occurrence of the external cause: Secondary | ICD-10-CM | POA: Diagnosis not present

## 2015-01-24 DIAGNOSIS — W010XXA Fall on same level from slipping, tripping and stumbling without subsequent striking against object, initial encounter: Secondary | ICD-10-CM | POA: Diagnosis not present

## 2015-01-24 DIAGNOSIS — E119 Type 2 diabetes mellitus without complications: Secondary | ICD-10-CM | POA: Diagnosis not present

## 2015-01-24 DIAGNOSIS — Z7982 Long term (current) use of aspirin: Secondary | ICD-10-CM | POA: Insufficient documentation

## 2015-01-24 DIAGNOSIS — I10 Essential (primary) hypertension: Secondary | ICD-10-CM | POA: Insufficient documentation

## 2015-01-24 DIAGNOSIS — W19XXXA Unspecified fall, initial encounter: Secondary | ICD-10-CM

## 2015-01-24 DIAGNOSIS — S8391XA Sprain of unspecified site of right knee, initial encounter: Secondary | ICD-10-CM | POA: Insufficient documentation

## 2015-01-24 DIAGNOSIS — S8991XA Unspecified injury of right lower leg, initial encounter: Secondary | ICD-10-CM | POA: Diagnosis present

## 2015-01-24 DIAGNOSIS — M79604 Pain in right leg: Secondary | ICD-10-CM

## 2015-01-24 DIAGNOSIS — Z79899 Other long term (current) drug therapy: Secondary | ICD-10-CM | POA: Insufficient documentation

## 2015-01-24 DIAGNOSIS — Z7952 Long term (current) use of systemic steroids: Secondary | ICD-10-CM | POA: Diagnosis not present

## 2015-01-24 MED ORDER — OXYCODONE-ACETAMINOPHEN 5-325 MG PO TABS
ORAL_TABLET | ORAL | Status: AC
  Administered 2015-01-24: 1 via ORAL
  Filled 2015-01-24: qty 1

## 2015-01-24 MED ORDER — OXYCODONE-ACETAMINOPHEN 5-325 MG PO TABS: 1.0000 | ORAL_TABLET | ORAL | Status: DC | PRN

## 2015-01-24 MED ORDER — OXYCODONE-ACETAMINOPHEN 5-325 MG PO TABS
1.0000 | ORAL_TABLET | Freq: Once | ORAL | Status: AC
  Administered 2015-01-24: 1 via ORAL

## 2015-01-24 MED ORDER — TRAMADOL HCL 50 MG PO TABS: 50.0000 mg | ORAL_TABLET | Freq: Once | ORAL | Status: DC

## 2015-01-24 NOTE — ED Notes (Signed)
Pt being discharged, NAD, with family at bedside. No additional questions or concerns at this time. Pt leaving via wheelchair.

## 2015-01-24 NOTE — ED Provider Notes (Signed)
[facility] Emergency Department Provider Note  ____________________________________________  Time seen: Approximately 8:29 AM  I have reviewed the triage vital signs and the nursing notes.   HISTORY  Chief Complaint Fall  HPI Wanda Monroe is a 74 y.o. female comes to the emergency room complaining of right leg pain. She states about midnight she got up to get Tylenol for her foot pain. She recently saw Dr. Elvina Mattes and had surgery on her left foot and is wearing a orthopedic type shoe. When she got up at midnight should not put her orthopedic shoe on. She slipped and fell. She complains of pain in her right thigh down to her ankle. Pain is a 10 out of 10 if it is touched or moved. If she is lying still pain is a 3. She has taken 2 Tylenol shortly after midnight when she fell. Trying to walk increases her pain. And being still improves her pain.  Past Medical History  Diagnosis Date  . Hypertension   . Stroke     x2, 9/10  . Neuromuscular disorder     left foot neuropathy  . GERD (gastroesophageal reflux disease)   . Gout   . Osteoporosis   . Arthritis     rhuematoid  . Spinal stenosis of lumbar region   . Anemia   . 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 Wears dentures     full top, partial bottom    There are no active problems to display for this patient.   Past Surgical History  Procedure Laterality Date  . Lumbar laminectomy  04/23/13  . Back surgery      lumbar and cervical laminectomies  . Tonsillectomy    . Appendectomy    . Nose surgery    . Ureter surgery    . Hemorrhoid surgery    . Foot surgery    . Abdominal hysterectomy    . Knee arthroscopy    . Breast biopsy Left     twice negative results  . Capsulotomy metatarsophalangeal Left 01/22/2015    Procedure: CAPSULOTOMY METATARSOPHALANGEAL;  Surgeon: Albertine Patricia, DPM;  Location: [place];  Service: Podiatry;  Laterality: Left;  . Hallux valgus akin  Left 01/22/2015    Procedure: HALLUX VALGUS AKIN;  Surgeon: Albertine Patricia, DPM;  Location: [place];  Service: Podiatry;  Laterality: Left;  left great toe    Current Outpatient Rx  Name  Route  Sig  Dispense  Refill  . alendronate (FOSAMAX) 70 MG tablet   Oral   Take 70 mg by mouth once a week. Take with a full glass of water on an empty stomach.         Marland Kitchen amLODipine (NORVASC) 2.5 MG tablet   Oral   Take 2.5 mg by mouth daily. AM         . aspirin 81 MG tablet   Oral   Take 81 mg by mouth daily. AM         . atenolol (TENORMIN) 25 MG tablet   Oral   Take 25 mg by mouth daily. PM         . atorvastatin (LIPITOR) 80 MG tablet   Oral   Take 80 mg by mouth daily. PM         . Biotin 1 MG CAPS   Oral   Take 2.5 mg by mouth daily. PM         . calcium citrate-vitamin  D (CITRACAL+D) 315-200 MG-UNIT per tablet   Oral   Take 1 tablet by mouth daily. AM         . Cholecalciferol (VITAMIN D-3 PO)   Oral   Take 1,000 mg by mouth daily. AM         . Cyanocobalamin (VITAMIN B12 PO)   Oral   Take 1,000 mcg by mouth daily. AM         . folic acid (FOLVITE) 007 MCG tablet   Oral   Take 400 mcg by mouth daily. AM         . gabapentin (NEURONTIN) 100 MG capsule   Oral   Take 100 mg by mouth 3 (three) times daily.         Marland Kitchen HYDROcodone-acetaminophen (NORCO/VICODIN) 5-325 MG per tablet   Oral   Take 1 tablet by mouth every 6 (six) hours as needed for moderate pain (may take 2).         . metFORMIN (GLUCOPHAGE) 500 MG tablet   Oral   Take 500 mg by mouth daily. AM         . methotrexate (RHEUMATREX) 2.5 MG tablet   Oral   Take 2.5 mg by mouth once a week. 4 tabs every  Wednesday         . omeprazole (PRILOSEC) 20 MG capsule   Oral   Take 20 mg by mouth daily. AM         . oxyCODONE-acetaminophen (ROXICET) 5-325 MG per tablet   Oral   Take 1 tablet by mouth every 4 (four) hours as needed for severe pain.   30 tablet   0   .  potassium chloride (K-DUR,KLOR-CON) 10 MEQ tablet   Oral   Take 10 mEq by mouth daily. AM         . prednisoLONE 5 MG TABS tablet   Oral   Take 5 mg by mouth every other day.         . sertraline (ZOLOFT) 50 MG tablet   Oral   Take 50 mg by mouth daily. PM           Allergies Codeine sulfate and Remicade  No family history on file.  Social History History  Substance Use Topics  . Smoking status: Never Smoker   . Smokeless tobacco: Not on file  . Alcohol Use: No    Review of Systems Constitutional: No fever/chills Eyes: No visual changes. ENT: No sore throat. Cardiovascular: Denies chest pain. Respiratory: Denies shortness of breath. Gastrointestinal: No abdominal pain.  No nausea, no vomiting.  No diarrhea.  No constipation. Genitourinary: Negative for dysuria. Musculoskeletal: Chronic back pain Skin: Negative for rash. Neurological: Negative for headaches, focal weakness or numbness. 10-point ROS otherwise negative.  ____________________________________________   PHYSICAL EXAM:  VITAL SIGNS: ED Triage Vitals  Enc Vitals Group     BP 01/24/15 0548 154/54 mmHg     Pulse Rate 01/24/15 0548 65     Resp 01/24/15 0548 18     Temp 01/24/15 0548 98.5 F (36.9 C)     Temp src --      SpO2 01/24/15 0548 100 %     Weight 01/24/15 0548 136 lb (61.689 kg)     Height 01/24/15 0548 5\' 6"  (1.676 m)     Head Cir --      Peak Flow --      Pain Score 01/24/15 0549 10     Pain Loc --  Pain Edu? --      Excl. in [place]? --     Constitutional: Alert and oriented. Well appearing and in no acute distress. Eyes: Conjunctivae are normal. PERRL. EOMI. Head: Atraumatic. Nose: No congestion/rhinnorhea. Mouth/Throat: Mucous membranes are moist. Neck: No stridor.  No cervical spine tenderness noted on palpation. Hematological/Lymphatic/Immunilogical: No cervical lymphadenopathy. Cardiovascular: Normal rate, regular rhythm. Grossly normal heart sounds.  Good peripheral  circulation. Respiratory: Normal respiratory effort.  No retractions. Lungs CTAB. Gastrointestinal: Soft and nontender. No distention. No abdominal bruits.  Musculoskeletal: Moderate tenderness of right lower extremity , no edema.  No joint effusions. Right knee with moderate degenerative changes. Patient guards entire extremity making range of motion hard to evaluate. No gross deformity was noted. No tenderness on percussion and pressure on the pelvic bones bilaterally. Currently patient is sitting in wheelchair. That was also examined while lying on stretcher. Neurologic:  Normal speech and language. No gross focal neurologic deficits are appreciated. Speech is normal. Gait was not tested Skin:  Skin is warm, dry and intact. No rash noted. Psychiatric: Mood and affect are normal. Speech and behavior are normal.  ____________________________________________   LABS (all labs ordered are listed, but only abnormal results are displayed)  Labs Reviewed - No data to display ____________________________________________  EKG  Deferred ____________________________________________  RADIOLOGY  X-ray right tib-fib shows no acute findings. Severe degenerative changes of the knee were seen per radiologist. X-rays of her right femur is negative for fracture or dislocation or bone lesion. ____________________________________________   PROCEDURES  Procedure(s) performed: None  Critical Care performed: No  ____________________________________________   INITIAL IMPRESSION / ASSESSMENT AND PLAN / ED COURSE  Pertinent labs & imaging results that were available during my care of the patient were reviewed by me and considered in my medical decision making (see chart for details).  Husband and patient both acknowledge that there is a walker in the home. We discussed using a short knee immobilizer to give her more support of her right knee. She has an appointment coming up with Dr. Elvina Mattes next  week for her foot. She is aware that Percocet may cause drowsiness increase her chances of falling ____________________________________________   FINAL CLINICAL IMPRESSION(S) / ED DIAGNOSES  Final diagnoses:  Pain  Fall  Acute leg pain, right  Right knee sprain, initial encounter      Johnn Hai, PA-C 01/24/15 1050  Johnn Hai, PA-C 01/24/15 1059  Nance Pear, MD 01/24/15 [phone]

## 2015-01-24 NOTE — ED Notes (Signed)
Pt fell at home tonight slipped in kitchen floor and now having pain from right ankle to right hip, no obvious deformity noted.  Denies any head injury, no neck or back pain.

## 2015-01-24 NOTE — ED Notes (Signed)
Pt c/o right leg pain, s/p fall at home. Pt had surgery in Dec on left foot, cast present on left foot. Pt attempted to get OOB to get tylenol last night and fell on kitchen floor and injured right leg. Tylenol X2 taken before arrival for pain.

## 2015-01-24 NOTE — Discharge Instructions (Signed)
Ice and elevate your knee if needed for swelling.  Wear your knee brace for extra support and use your walker Percocet for pain as needed.  This may increase your risk of falling as this medication could cause drowsiness

## 2015-02-10 ENCOUNTER — Encounter: Payer: Self-pay | Admitting: Podiatry

## 2015-11-03 DIAGNOSIS — D508 Other iron deficiency anemias: Secondary | ICD-10-CM | POA: Diagnosis not present

## 2015-11-03 DIAGNOSIS — M316 Other giant cell arteritis: Secondary | ICD-10-CM | POA: Diagnosis not present

## 2015-11-03 DIAGNOSIS — M0579 Rheumatoid arthritis with rheumatoid factor of multiple sites without organ or systems involvement: Secondary | ICD-10-CM | POA: Diagnosis not present

## 2015-12-21 ENCOUNTER — Other Ambulatory Visit: Payer: Self-pay | Admitting: Internal Medicine

## 2015-12-21 DIAGNOSIS — Z1231 Encounter for screening mammogram for malignant neoplasm of breast: Secondary | ICD-10-CM

## 2015-12-23 DIAGNOSIS — M316 Other giant cell arteritis: Secondary | ICD-10-CM | POA: Diagnosis not present

## 2015-12-28 ENCOUNTER — Ambulatory Visit
Admission: RE | Admit: 2015-12-28 | Discharge: 2015-12-28 | Disposition: A | Discharge status: Home / Self Care | Payer: PPO | Source: Ambulatory Visit | Attending: Internal Medicine | Admitting: Internal Medicine

## 2015-12-28 DIAGNOSIS — Z1231 Encounter for screening mammogram for malignant neoplasm of breast: Secondary | ICD-10-CM

## 2016-01-18 ENCOUNTER — Ambulatory Visit
Admission: RE | Admit: 2016-01-18 | Discharge: 2016-01-18 | Disposition: A | Discharge status: Home / Self Care | Payer: PPO | Source: Ambulatory Visit | Attending: Internal Medicine | Admitting: Internal Medicine

## 2016-01-18 ENCOUNTER — Other Ambulatory Visit: Payer: Self-pay | Admitting: Internal Medicine

## 2016-01-18 DIAGNOSIS — Z1231 Encounter for screening mammogram for malignant neoplasm of breast: Secondary | ICD-10-CM | POA: Diagnosis not present

## 2016-01-19 DIAGNOSIS — Z0000 Encounter for general adult medical examination without abnormal findings: Secondary | ICD-10-CM | POA: Diagnosis not present

## 2016-01-19 DIAGNOSIS — E119 Type 2 diabetes mellitus without complications: Secondary | ICD-10-CM | POA: Diagnosis not present

## 2016-01-19 DIAGNOSIS — M5137 Other intervertebral disc degeneration, lumbosacral region: Secondary | ICD-10-CM | POA: Diagnosis not present

## 2016-01-19 DIAGNOSIS — Z79899 Other long term (current) drug therapy: Secondary | ICD-10-CM | POA: Diagnosis not present

## 2016-01-19 DIAGNOSIS — M1990 Unspecified osteoarthritis, unspecified site: Secondary | ICD-10-CM | POA: Diagnosis not present

## 2016-01-19 DIAGNOSIS — I10 Essential (primary) hypertension: Secondary | ICD-10-CM | POA: Diagnosis not present

## 2016-01-19 DIAGNOSIS — H9201 Otalgia, right ear: Secondary | ICD-10-CM | POA: Diagnosis not present

## 2016-01-19 DIAGNOSIS — M316 Other giant cell arteritis: Secondary | ICD-10-CM | POA: Diagnosis not present

## 2016-01-19 DIAGNOSIS — E7800 Pure hypercholesterolemia, unspecified: Secondary | ICD-10-CM | POA: Diagnosis not present

## 2016-01-19 DIAGNOSIS — D649 Anemia, unspecified: Secondary | ICD-10-CM | POA: Diagnosis not present

## 2016-02-15 DIAGNOSIS — H6121 Impacted cerumen, right ear: Secondary | ICD-10-CM | POA: Diagnosis not present

## 2016-02-15 DIAGNOSIS — H60331 Swimmer's ear, right ear: Secondary | ICD-10-CM | POA: Diagnosis not present

## 2016-02-16 DIAGNOSIS — I10 Essential (primary) hypertension: Secondary | ICD-10-CM | POA: Diagnosis not present

## 2016-02-16 DIAGNOSIS — E7800 Pure hypercholesterolemia, unspecified: Secondary | ICD-10-CM | POA: Diagnosis not present

## 2016-02-16 DIAGNOSIS — Z79899 Other long term (current) drug therapy: Secondary | ICD-10-CM | POA: Diagnosis not present

## 2016-02-16 DIAGNOSIS — H40003 Preglaucoma, unspecified, bilateral: Secondary | ICD-10-CM | POA: Diagnosis not present

## 2016-02-16 DIAGNOSIS — M316 Other giant cell arteritis: Secondary | ICD-10-CM | POA: Diagnosis not present

## 2016-02-16 DIAGNOSIS — E119 Type 2 diabetes mellitus without complications: Secondary | ICD-10-CM | POA: Diagnosis not present

## 2016-02-29 ENCOUNTER — Other Ambulatory Visit: Payer: Self-pay | Admitting: Physical Medicine and Rehabilitation

## 2016-02-29 DIAGNOSIS — M5416 Radiculopathy, lumbar region: Secondary | ICD-10-CM | POA: Diagnosis not present

## 2016-02-29 DIAGNOSIS — M48061 Spinal stenosis, lumbar region without neurogenic claudication: Secondary | ICD-10-CM

## 2016-02-29 DIAGNOSIS — M5126 Other intervertebral disc displacement, lumbar region: Secondary | ICD-10-CM | POA: Diagnosis not present

## 2016-02-29 DIAGNOSIS — M47816 Spondylosis without myelopathy or radiculopathy, lumbar region: Secondary | ICD-10-CM | POA: Diagnosis not present

## 2016-02-29 DIAGNOSIS — G629 Polyneuropathy, unspecified: Secondary | ICD-10-CM | POA: Diagnosis not present

## 2016-03-07 DIAGNOSIS — H401132 Primary open-angle glaucoma, bilateral, moderate stage: Secondary | ICD-10-CM | POA: Diagnosis not present

## 2016-03-16 DIAGNOSIS — M316 Other giant cell arteritis: Secondary | ICD-10-CM | POA: Diagnosis not present

## 2016-03-21 ENCOUNTER — Ambulatory Visit
Admission: RE | Admit: 2016-03-21 | Discharge: 2016-03-21 | Disposition: A | Discharge status: Home / Self Care | Payer: PPO | Source: Ambulatory Visit | Attending: Physical Medicine and Rehabilitation | Admitting: Physical Medicine and Rehabilitation

## 2016-03-21 DIAGNOSIS — M5136 Other intervertebral disc degeneration, lumbar region: Secondary | ICD-10-CM | POA: Diagnosis not present

## 2016-03-21 DIAGNOSIS — M5416 Radiculopathy, lumbar region: Secondary | ICD-10-CM | POA: Insufficient documentation

## 2016-03-21 DIAGNOSIS — M479 Spondylosis, unspecified: Secondary | ICD-10-CM | POA: Diagnosis not present

## 2016-03-21 DIAGNOSIS — M48061 Spinal stenosis, lumbar region without neurogenic claudication: Secondary | ICD-10-CM

## 2016-04-04 DIAGNOSIS — M5126 Other intervertebral disc displacement, lumbar region: Secondary | ICD-10-CM | POA: Diagnosis not present

## 2016-04-04 DIAGNOSIS — M5416 Radiculopathy, lumbar region: Secondary | ICD-10-CM | POA: Diagnosis not present

## 2016-04-04 DIAGNOSIS — M47816 Spondylosis without myelopathy or radiculopathy, lumbar region: Secondary | ICD-10-CM | POA: Diagnosis not present

## 2016-04-05 DIAGNOSIS — H401132 Primary open-angle glaucoma, bilateral, moderate stage: Secondary | ICD-10-CM | POA: Diagnosis not present

## 2016-04-18 DIAGNOSIS — M316 Other giant cell arteritis: Secondary | ICD-10-CM | POA: Diagnosis not present

## 2016-04-25 DIAGNOSIS — M1711 Unilateral primary osteoarthritis, right knee: Secondary | ICD-10-CM | POA: Diagnosis not present

## 2016-05-17 DIAGNOSIS — M47816 Spondylosis without myelopathy or radiculopathy, lumbar region: Secondary | ICD-10-CM | POA: Diagnosis not present

## 2016-05-17 DIAGNOSIS — M5126 Other intervertebral disc displacement, lumbar region: Secondary | ICD-10-CM | POA: Diagnosis not present

## 2016-05-17 DIAGNOSIS — M5416 Radiculopathy, lumbar region: Secondary | ICD-10-CM | POA: Diagnosis not present

## 2016-06-16 DIAGNOSIS — Z23 Encounter for immunization: Secondary | ICD-10-CM | POA: Diagnosis not present

## 2016-06-20 DIAGNOSIS — M316 Other giant cell arteritis: Secondary | ICD-10-CM | POA: Diagnosis not present

## 2016-07-26 DIAGNOSIS — M316 Other giant cell arteritis: Secondary | ICD-10-CM | POA: Diagnosis not present

## 2016-07-26 DIAGNOSIS — F411 Generalized anxiety disorder: Secondary | ICD-10-CM | POA: Diagnosis not present

## 2016-07-26 DIAGNOSIS — E119 Type 2 diabetes mellitus without complications: Secondary | ICD-10-CM | POA: Diagnosis not present

## 2016-07-26 DIAGNOSIS — I10 Essential (primary) hypertension: Secondary | ICD-10-CM | POA: Diagnosis not present

## 2016-07-26 DIAGNOSIS — E7800 Pure hypercholesterolemia, unspecified: Secondary | ICD-10-CM | POA: Diagnosis not present

## 2016-07-26 DIAGNOSIS — Z79899 Other long term (current) drug therapy: Secondary | ICD-10-CM | POA: Diagnosis not present

## 2016-07-26 DIAGNOSIS — E876 Hypokalemia: Secondary | ICD-10-CM | POA: Diagnosis not present

## 2016-07-26 DIAGNOSIS — R195 Other fecal abnormalities: Secondary | ICD-10-CM | POA: Diagnosis not present

## 2016-07-27 DIAGNOSIS — M1990 Unspecified osteoarthritis, unspecified site: Secondary | ICD-10-CM | POA: Diagnosis not present

## 2016-07-27 DIAGNOSIS — Z79899 Other long term (current) drug therapy: Secondary | ICD-10-CM | POA: Diagnosis not present

## 2016-07-27 DIAGNOSIS — M25511 Pain in right shoulder: Secondary | ICD-10-CM | POA: Diagnosis not present

## 2016-07-27 DIAGNOSIS — M65311 Trigger thumb, right thumb: Secondary | ICD-10-CM | POA: Diagnosis not present

## 2016-08-29 DIAGNOSIS — M316 Other giant cell arteritis: Secondary | ICD-10-CM | POA: Diagnosis not present

## 2016-08-29 DIAGNOSIS — E7800 Pure hypercholesterolemia, unspecified: Secondary | ICD-10-CM | POA: Diagnosis not present

## 2016-08-29 DIAGNOSIS — M1990 Unspecified osteoarthritis, unspecified site: Secondary | ICD-10-CM | POA: Diagnosis not present

## 2016-08-29 DIAGNOSIS — F4323 Adjustment disorder with mixed anxiety and depressed mood: Secondary | ICD-10-CM | POA: Diagnosis not present

## 2016-08-29 DIAGNOSIS — E119 Type 2 diabetes mellitus without complications: Secondary | ICD-10-CM | POA: Diagnosis not present

## 2016-08-29 DIAGNOSIS — Z79899 Other long term (current) drug therapy: Secondary | ICD-10-CM | POA: Diagnosis not present

## 2016-08-29 DIAGNOSIS — I10 Essential (primary) hypertension: Secondary | ICD-10-CM | POA: Diagnosis not present

## 2016-09-13 DIAGNOSIS — M25511 Pain in right shoulder: Secondary | ICD-10-CM | POA: Diagnosis not present

## 2016-09-13 DIAGNOSIS — M79641 Pain in right hand: Secondary | ICD-10-CM | POA: Diagnosis not present

## 2016-09-13 DIAGNOSIS — M65311 Trigger thumb, right thumb: Secondary | ICD-10-CM | POA: Diagnosis not present

## 2016-09-13 DIAGNOSIS — G8929 Other chronic pain: Secondary | ICD-10-CM | POA: Diagnosis not present

## 2016-09-13 DIAGNOSIS — M316 Other giant cell arteritis: Secondary | ICD-10-CM | POA: Diagnosis not present

## 2016-09-13 DIAGNOSIS — M0579 Rheumatoid arthritis with rheumatoid factor of multiple sites without organ or systems involvement: Secondary | ICD-10-CM | POA: Diagnosis not present

## 2016-09-27 DIAGNOSIS — Z79899 Other long term (current) drug therapy: Secondary | ICD-10-CM | POA: Diagnosis not present

## 2016-10-04 DIAGNOSIS — H2513 Age-related nuclear cataract, bilateral: Secondary | ICD-10-CM | POA: Diagnosis not present

## 2016-10-07 DIAGNOSIS — H2513 Age-related nuclear cataract, bilateral: Secondary | ICD-10-CM | POA: Diagnosis not present

## 2016-10-10 DIAGNOSIS — I10 Essential (primary) hypertension: Secondary | ICD-10-CM | POA: Diagnosis not present

## 2016-10-10 DIAGNOSIS — E7800 Pure hypercholesterolemia, unspecified: Secondary | ICD-10-CM | POA: Diagnosis not present

## 2016-10-10 DIAGNOSIS — Z79899 Other long term (current) drug therapy: Secondary | ICD-10-CM | POA: Diagnosis not present

## 2016-10-10 DIAGNOSIS — E119 Type 2 diabetes mellitus without complications: Secondary | ICD-10-CM | POA: Diagnosis not present

## 2016-10-10 DIAGNOSIS — M069 Rheumatoid arthritis, unspecified: Secondary | ICD-10-CM | POA: Diagnosis not present

## 2016-10-13 ENCOUNTER — Encounter: Payer: Self-pay | Admitting: *Deleted

## 2016-10-18 NOTE — Discharge Instructions (Signed)
Cataract Surgery, Care After °Refer to this sheet in the next few weeks. These instructions provide you with information about caring for yourself after your procedure. Your health care provider may also give you more specific instructions. Your treatment has been planned according to current medical practices, but problems sometimes occur. Call your health care provider if you have any problems or questions after your procedure. °What can I expect after the procedure? °After the procedure, it is common to have: °· Itching. °· Discomfort. °· Fluid discharge. °· Sensitivity to light and to touch. °· Bruising. °Follow these instructions at home: °Eye Care  °· Check your eye every day for signs of infection. Watch for: °¨ Redness, swelling, or pain. °¨ Fluid, blood, or pus. °¨ Warmth. °¨ Bad smell. °Activity  °· Avoid strenuous activities, such as playing contact sports, for as long as told by your health care provider. °· Do not drive or operate heavy machinery until your health care provider approves. °· Do not bend or lift heavy objects . Bending increases pressure in the eye. You can walk, climb stairs, and do light household chores. °· Ask your health care provider when you can return to work. If you work in a dusty environment, you may be advised to wear protective eyewear for a period of time. °General instructions  °· Take or apply over-the-counter and prescription medicines only as told by your health care provider. This includes eye drops. °· Do not touch or rub your eyes. °· If you were given a protective shield, wear it as told by your health care provider. If you were not given a protective shield, wear sunglasses as told by your health care provider to protect your eyes. °· Keep the area around your eye clean and dry. Avoid swimming or allowing water to hit you directly in the face while showering until told by your health care provider. Keep soap and shampoo out of your eyes. °· Do not put a contact lens  into the affected eye or eyes until your health care provider approves. °· Keep all follow-up visits as told by your health care provider. This is important. °Contact a health care provider if: ° °· You have increased bruising around your eye. °· You have pain that is not helped with medicine. °· You have a fever. °· You have redness, swelling, or pain in your eye. °· You have fluid, blood, or pus coming from your incision. °· Your vision gets worse. °Get help right away if: °· You have sudden vision loss. °This information is not intended to replace advice given to you by your health care provider. Make sure you discuss any questions you have with your health care provider. °Document Released: 03/18/2005 Document Revised: 01/07/2016 Document Reviewed: 07/09/2015 °Elsevier Interactive Patient Education © 2017 [employer] ° ° ° ° °General Anesthesia, Adult, Care After °These instructions provide you with information about caring for yourself after your procedure. Your health care provider may also give you more specific instructions. Your treatment has been planned according to current medical practices, but problems sometimes occur. Call your health care provider if you have any problems or questions after your procedure. °What can I expect after the procedure? °After the procedure, it is common to have: °· Vomiting. °· A sore throat. °· Mental slowness. °It is common to feel: °· Nauseous. °· Cold or shivery. °· Sleepy. °· Tired. °· Sore or achy, even in parts of your body where you did not have surgery. °Follow these instructions at   home: °For at least 24 hours after the procedure:  °· Do not: °¨ Participate in activities where you could fall or become injured. °¨ Drive. °¨ Use heavy machinery. °¨ Drink alcohol. °¨ Take sleeping pills or medicines that cause drowsiness. °¨ Make important decisions or sign legal documents. °¨ Take care of children on your own. °· Rest. °Eating and drinking  °· If you vomit, drink  water, juice, or soup when you can drink without vomiting. °· Drink enough fluid to keep your urine clear or pale yellow. °· Make sure you have little or no nausea before eating solid foods. °· Follow the diet recommended by your health care provider. °General instructions  °· Have a responsible adult stay with you until you are awake and alert. °· Return to your normal activities as told by your health care provider. Ask your health care provider what activities are safe for you. °· Take over-the-counter and prescription medicines only as told by your health care provider. °· If you smoke, do not smoke without supervision. °· Keep all follow-up visits as told by your health care provider. This is important. °Contact a health care provider if: °· You continue to have nausea or vomiting at home, and medicines are not helpful. °· You cannot drink fluids or start eating again. °· You cannot urinate after 8-12 hours. °· You develop a skin rash. °· You have fever. °· You have increasing redness at the site of your procedure. °Get help right away if: °· You have difficulty breathing. °· You have chest pain. °· You have unexpected bleeding. °· You feel that you are having a life-threatening or urgent problem. °This information is not intended to replace advice given to you by your health care provider. Make sure you discuss any questions you have with your health care provider. °Document Released: 12/05/2000 Document Revised: 02/01/2016 Document Reviewed: 08/13/2015 °Elsevier Interactive Patient Education © 2017 [employer] ° °

## 2016-10-19 ENCOUNTER — Ambulatory Visit
Admission: RE | Admit: 2016-10-19 | Discharge: 2016-10-19 | Disposition: A | Discharge status: Home / Self Care | Payer: PPO | Source: Ambulatory Visit | Attending: Ophthalmology | Admitting: Ophthalmology

## 2016-10-19 ENCOUNTER — Encounter
Admission: RE | Disposition: A | Discharge status: Home / Self Care | Payer: Self-pay | Source: Ambulatory Visit | Attending: Ophthalmology

## 2016-10-19 ENCOUNTER — Ambulatory Visit: Payer: PPO | Admitting: Anesthesiology

## 2016-10-19 DIAGNOSIS — H2512 Age-related nuclear cataract, left eye: Secondary | ICD-10-CM | POA: Insufficient documentation

## 2016-10-19 DIAGNOSIS — H2513 Age-related nuclear cataract, bilateral: Secondary | ICD-10-CM | POA: Diagnosis not present

## 2016-10-19 DIAGNOSIS — E119 Type 2 diabetes mellitus without complications: Secondary | ICD-10-CM | POA: Insufficient documentation

## 2016-10-19 DIAGNOSIS — Z8673 Personal history of transient ischemic attack (TIA), and cerebral infarction without residual deficits: Secondary | ICD-10-CM | POA: Diagnosis not present

## 2016-10-19 DIAGNOSIS — K219 Gastro-esophageal reflux disease without esophagitis: Secondary | ICD-10-CM | POA: Diagnosis not present

## 2016-10-19 DIAGNOSIS — I10 Essential (primary) hypertension: Secondary | ICD-10-CM | POA: Insufficient documentation

## 2016-10-19 HISTORY — PX: CATARACT EXTRACTION W/PHACO: SHX586

## 2016-10-19 LAB — GLUCOSE, CAPILLARY: GLUCOSE-CAPILLARY: 92 mg/dL (ref 65–99)

## 2016-10-19 SURGERY — PHACOEMULSIFICATION, CATARACT, WITH IOL INSERTION
Anesthesia: Monitor Anesthesia Care | Laterality: Left | Wound class: Clean

## 2016-10-19 MED ORDER — FENTANYL CITRATE (PF) 100 MCG/2ML IJ SOLN
INTRAMUSCULAR | Status: DC | PRN
  Administered 2016-10-19: 50 ug via INTRAVENOUS

## 2016-10-19 MED ORDER — CEFUROXIME OPHTHALMIC INJECTION 1 MG/0.1 ML
INJECTION | OPHTHALMIC | Status: DC | PRN
  Administered 2016-10-19: 0.1 mL via OPHTHALMIC

## 2016-10-19 MED ORDER — ARMC OPHTHALMIC DILATING DROPS
1.0000 "application " | OPHTHALMIC | Status: DC | PRN
  Administered 2016-10-19 (×3): 1 via OPHTHALMIC

## 2016-10-19 MED ORDER — ACETAMINOPHEN 160 MG/5ML PO SOLN: 325.0000 mg | ORAL | Status: DC | PRN

## 2016-10-19 MED ORDER — BRIMONIDINE TARTRATE-TIMOLOL 0.2-0.5 % OP SOLN
OPHTHALMIC | Status: DC | PRN
  Administered 2016-10-19: 1 [drp] via OPHTHALMIC

## 2016-10-19 MED ORDER — NA HYALUR & NA CHOND-NA HYALUR 0.4-0.35 ML IO KIT
PACK | INTRAOCULAR | Status: DC | PRN
  Administered 2016-10-19: 1 mL via INTRAOCULAR

## 2016-10-19 MED ORDER — EPINEPHRINE PF 1 MG/ML IJ SOLN
INTRAOCULAR | Status: DC | PRN
  Administered 2016-10-19: 70 mL via OPHTHALMIC

## 2016-10-19 MED ORDER — ACETAMINOPHEN 325 MG PO TABS: 325.0000 mg | ORAL_TABLET | ORAL | Status: DC | PRN

## 2016-10-19 MED ORDER — MOXIFLOXACIN HCL 0.5 % OP SOLN
1.0000 [drp] | OPHTHALMIC | Status: DC | PRN
  Administered 2016-10-19 (×3): 1 [drp] via OPHTHALMIC

## 2016-10-19 MED ORDER — MIDAZOLAM HCL 2 MG/2ML IJ SOLN
INTRAMUSCULAR | Status: DC | PRN
  Administered 2016-10-19: 2 mg via INTRAVENOUS

## 2016-10-19 MED ORDER — LIDOCAINE HCL (PF) 2 % IJ SOLN
INTRAMUSCULAR | Status: DC | PRN
  Administered 2016-10-19: 1 mL

## 2016-10-19 MED ORDER — LACTATED RINGERS IV SOLN: INTRAVENOUS | Status: DC

## 2016-10-19 SURGICAL SUPPLY — 25 items
CANNULA ANT/CHMB 27GA (MISCELLANEOUS) ×3 IMPLANT
CARTRIDGE ABBOTT (MISCELLANEOUS) IMPLANT
GLOVE SURG LX 7.5 STRW (GLOVE) ×2
GLOVE SURG LX STRL 7.5 STRW (GLOVE) ×1 IMPLANT
GLOVE SURG TRIUMPH 8.0 PF LTX (GLOVE) ×3 IMPLANT
GOWN STRL REUS W/ TWL LRG LVL3 (GOWN DISPOSABLE) ×2 IMPLANT
GOWN STRL REUS W/TWL LRG LVL3 (GOWN DISPOSABLE) ×4
LENS IOL TECNIS ITEC 19.0 (Intraocular Lens) ×3 IMPLANT
MARKER SKIN DUAL TIP RULER LAB (MISCELLANEOUS) ×3 IMPLANT
NDL RETROBULBAR .5 NSTRL (NEEDLE) IMPLANT
NEEDLE FILTER BLUNT 18X 1/2SAF (NEEDLE) ×2
NEEDLE FILTER BLUNT 18X1 1/2 (NEEDLE) ×1 IMPLANT
PACK CATARACT BRASINGTON (MISCELLANEOUS) ×3 IMPLANT
PACK EYE AFTER SURG (MISCELLANEOUS) ×3 IMPLANT
PACK OPTHALMIC (MISCELLANEOUS) ×3 IMPLANT
RING MALYGIN 7.0 (MISCELLANEOUS) IMPLANT
SUT ETHILON 10-0 CS-B-6CS-B-6 (SUTURE)
SUT VICRYL  9 0 (SUTURE)
SUT VICRYL 9 0 (SUTURE) IMPLANT
SUTURE EHLN 10-0 CS-B-6CS-B-6 (SUTURE) IMPLANT
SYR 3ML LL SCALE MARK (SYRINGE) ×3 IMPLANT
SYR 5ML LL (SYRINGE) ×3 IMPLANT
SYR TB 1ML LUER SLIP (SYRINGE) ×3 IMPLANT
WATER STERILE IRR 250ML POUR (IV SOLUTION) ×3 IMPLANT
WIPE NON LINTING 3.25X3.25 (MISCELLANEOUS) ×3 IMPLANT

## 2016-10-19 NOTE — H&P (Signed)
The History and Physical notes are on paper, have been signed, and are to be scanned. The patient remains stable and unchanged from the H&P.   Previous H&P reviewed, patient examined, and there are no changes.  Wanda Monroe 10/19/2016 7:41 AM

## 2016-10-19 NOTE — Anesthesia Procedure Notes (Signed)
Procedure Name: MAC Performed by: Mayme Genta Pre-anesthesia Checklist: Patient identified, Emergency Drugs available, Suction available, Timeout performed and Patient being monitored Patient Re-evaluated:Patient Re-evaluated prior to inductionOxygen Delivery Method: Nasal cannula Placement Confirmation: positive ETCO2

## 2016-10-19 NOTE — Transfer of Care (Signed)
Immediate Anesthesia Transfer of Care Note  Patient: Wanda Monroe  Procedure(s) Performed: Procedure(s) with comments: CATARACT EXTRACTION PHACO AND INTRAOCULAR LENS PLACEMENT ([place])  Left Eye  Diabetic (Left) - Diabetic - oral meds Left Eye  Patient Location: PACU  Anesthesia Type: MAC  Level of Consciousness: awake, alert  and patient cooperative  Airway and Oxygen Therapy: Patient Spontanous Breathing and Patient connected to supplemental oxygen  Post-op Assessment: Post-op Vital signs reviewed, Patient's Cardiovascular Status Stable, Respiratory Function Stable, Patent Airway and No signs of Nausea or vomiting  Post-op Vital Signs: Reviewed and stable  Complications: No apparent anesthesia complications

## 2016-10-19 NOTE — Anesthesia Preprocedure Evaluation (Signed)
Anesthesia Evaluation  Patient identified by MRN, date of birth, ID band Patient awake    Reviewed: Allergy & Precautions, H&P , NPO status , Patient's Chart, lab work & pertinent test results  Airway Mallampati: II  TM Distance: >3 FB Neck ROM: full    Dental no notable dental hx.    Pulmonary    Pulmonary exam normal        Cardiovascular hypertension, Normal cardiovascular exam     Neuro/Psych CVA    GI/Hepatic GERD  ,  Endo/Other  diabetes  Renal/GU      Musculoskeletal   Abdominal   Peds  Hematology   Anesthesia Other Findings   Reproductive/Obstetrics                             Anesthesia Physical Anesthesia Plan  ASA: II  Anesthesia Plan: MAC   Post-op Pain Management:    Induction:   Airway Management Planned:   Additional Equipment:   Intra-op Plan:   Post-operative Plan:   Informed Consent: I have reviewed the patients History and Physical, chart, labs and discussed the procedure including the risks, benefits and alternatives for the proposed anesthesia with the patient or authorized representative who has indicated his/her understanding and acceptance.     Plan Discussed with:   Anesthesia Plan Comments:         Anesthesia Quick Evaluation

## 2016-10-19 NOTE — Anesthesia Postprocedure Evaluation (Signed)
Anesthesia Post Note  Patient: Wanda Monroe  Procedure(s) Performed: Procedure(s) (LRB): CATARACT EXTRACTION PHACO AND INTRAOCULAR LENS PLACEMENT ([facility])  Left Eye  Diabetic (Left)  Patient location during evaluation: PACU Anesthesia Type: MAC Level of consciousness: awake and alert and oriented Pain management: satisfactory to patient Vital Signs Assessment: post-procedure vital signs reviewed and stable Respiratory status: spontaneous breathing, nonlabored ventilation and respiratory function stable Cardiovascular status: blood pressure returned to baseline and stable Postop Assessment: Adequate PO intake and No signs of nausea or vomiting Anesthetic complications: no    Raliegh Ip

## 2016-10-19 NOTE — Op Note (Signed)
OPERATIVE NOTE  Wanda Monroe [id number] 10/19/2016   PREOPERATIVE DIAGNOSIS:  Nuclear sclerotic cataract left eye. H25.12   POSTOPERATIVE DIAGNOSIS:    Nuclear sclerotic cataract left eye.     PROCEDURE:  Phacoemusification with posterior chamber intraocular lens placement of the left eye   LENS:   Implant Name Type Inv. Item Serial No. Manufacturer Lot No. LRB No. Used  LENS IOL DIOP 19.0 - [id number] Intraocular Lens LENS IOL DIOP 19.0 [id number]   Left 1        ULTRASOUND TIME: 25  % of 1 minutes 33 seconds, CDE 23.1  SURGEON:  Wyonia Hough, MD   ANESTHESIA:  Topical with tetracaine drops and 2% Xylocaine jelly, augmented with 1% preservative-free intracameral lidocaine.    COMPLICATIONS:  None.   DESCRIPTION OF PROCEDURE:  The patient was identified in the holding room and transported to the operating room and placed in the supine position under the operating microscope.  The left eye was identified as the operative eye and it was prepped and draped in the usual sterile ophthalmic fashion.   A 1 millimeter clear-corneal paracentesis was made at the 1:30 position.  0.5 ml of preservative-free 1% lidocaine was injected into the anterior chamber.  The anterior chamber was filled with Viscoat viscoelastic.  A 2.4 millimeter keratome was used to make a near-clear corneal incision at the 10:30 position.  .  A curvilinear capsulorrhexis was made with a cystotome and capsulorrhexis forceps.  Balanced salt solution was used to hydrodissect and hydrodelineate the nucleus.   Phacoemulsification was then used in stop and chop fashion to remove the lens nucleus and epinucleus.  The remaining cortex was then removed using the irrigation and aspiration handpiece. Provisc was then placed into the capsular bag to distend it for lens placement.  A lens was then injected into the capsular bag.  The remaining viscoelastic was aspirated.   Wounds were hydrated with balanced salt  solution.  The anterior chamber was inflated to a physiologic pressure with balanced salt solution.  No wound leaks were noted. Cefuroxime 0.1 ml of a 10mg /ml solution was injected into the anterior chamber for a dose of 1 mg of intracameral antibiotic at the completion of the case.   Timolol and Brimonidine drops were applied to the eye.  The patient was taken to the recovery room in stable condition without complications of anesthesia or surgery.  Wanda Monroe 10/19/2016, 8:07 AM

## 2016-10-20 ENCOUNTER — Encounter: Payer: Self-pay | Admitting: Ophthalmology

## 2016-11-07 DIAGNOSIS — H2511 Age-related nuclear cataract, right eye: Secondary | ICD-10-CM | POA: Diagnosis not present

## 2016-11-11 ENCOUNTER — Encounter: Payer: Self-pay | Admitting: *Deleted

## 2016-11-11 NOTE — Discharge Instructions (Signed)
Cataract Surgery, Care After °Refer to this sheet in the next few weeks. These instructions provide you with information about caring for yourself after your procedure. Your health care provider may also give you more specific instructions. Your treatment has been planned according to current medical practices, but problems sometimes occur. Call your health care provider if you have any problems or questions after your procedure. °What can I expect after the procedure? °After the procedure, it is common to have: °· Itching. °· Discomfort. °· Fluid discharge. °· Sensitivity to light and to touch. °· Bruising. °Follow these instructions at home: °Eye Care  °· Check your eye every day for signs of infection. Watch for: °¨ Redness, swelling, or pain. °¨ Fluid, blood, or pus. °¨ Warmth. °¨ Bad smell. °Activity  °· Avoid strenuous activities, such as playing contact sports, for as long as told by your health care provider. °· Do not drive or operate heavy machinery until your health care provider approves. °· Do not bend or lift heavy objects . Bending increases pressure in the eye. You can walk, climb stairs, and do light household chores. °· Ask your health care provider when you can return to work. If you work in a dusty environment, you may be advised to wear protective eyewear for a period of time. °General instructions  °· Take or apply over-the-counter and prescription medicines only as told by your health care provider. This includes eye drops. °· Do not touch or rub your eyes. °· If you were given a protective shield, wear it as told by your health care provider. If you were not given a protective shield, wear sunglasses as told by your health care provider to protect your eyes. °· Keep the area around your eye clean and dry. Avoid swimming or allowing water to hit you directly in the face while showering until told by your health care provider. Keep soap and shampoo out of your eyes. °· Do not put a contact lens  into the affected eye or eyes until your health care provider approves. °· Keep all follow-up visits as told by your health care provider. This is important. °Contact a health care provider if: ° °· You have increased bruising around your eye. °· You have pain that is not helped with medicine. °· You have a fever. °· You have redness, swelling, or pain in your eye. °· You have fluid, blood, or pus coming from your incision. °· Your vision gets worse. °Get help right away if: °· You have sudden vision loss. °This information is not intended to replace advice given to you by your health care provider. Make sure you discuss any questions you have with your health care provider. °Document Released: 03/18/2005 Document Revised: 01/07/2016 Document Reviewed: 07/09/2015 °Elsevier Interactive Patient Education © 2017 [employer] ° ° ° ° °General Anesthesia, Adult, Care After °These instructions provide you with information about caring for yourself after your procedure. Your health care provider may also give you more specific instructions. Your treatment has been planned according to current medical practices, but problems sometimes occur. Call your health care provider if you have any problems or questions after your procedure. °What can I expect after the procedure? °After the procedure, it is common to have: °· Vomiting. °· A sore throat. °· Mental slowness. °It is common to feel: °· Nauseous. °· Cold or shivery. °· Sleepy. °· Tired. °· Sore or achy, even in parts of your body where you did not have surgery. °Follow these instructions at   home: °For at least 24 hours after the procedure:  °· Do not: °¨ Participate in activities where you could fall or become injured. °¨ Drive. °¨ Use heavy machinery. °¨ Drink alcohol. °¨ Take sleeping pills or medicines that cause drowsiness. °¨ Make important decisions or sign legal documents. °¨ Take care of children on your own. °· Rest. °Eating and drinking  °· If you vomit, drink  water, juice, or soup when you can drink without vomiting. °· Drink enough fluid to keep your urine clear or pale yellow. °· Make sure you have little or no nausea before eating solid foods. °· Follow the diet recommended by your health care provider. °General instructions  °· Have a responsible adult stay with you until you are awake and alert. °· Return to your normal activities as told by your health care provider. Ask your health care provider what activities are safe for you. °· Take over-the-counter and prescription medicines only as told by your health care provider. °· If you smoke, do not smoke without supervision. °· Keep all follow-up visits as told by your health care provider. This is important. °Contact a health care provider if: °· You continue to have nausea or vomiting at home, and medicines are not helpful. °· You cannot drink fluids or start eating again. °· You cannot urinate after 8-12 hours. °· You develop a skin rash. °· You have fever. °· You have increasing redness at the site of your procedure. °Get help right away if: °· You have difficulty breathing. °· You have chest pain. °· You have unexpected bleeding. °· You feel that you are having a life-threatening or urgent problem. °This information is not intended to replace advice given to you by your health care provider. Make sure you discuss any questions you have with your health care provider. °Document Released: 12/05/2000 Document Revised: 02/01/2016 Document Reviewed: 08/13/2015 °Elsevier Interactive Patient Education © 2017 [employer] ° °

## 2016-11-14 DIAGNOSIS — M25511 Pain in right shoulder: Secondary | ICD-10-CM | POA: Diagnosis not present

## 2016-11-14 DIAGNOSIS — M0579 Rheumatoid arthritis with rheumatoid factor of multiple sites without organ or systems involvement: Secondary | ICD-10-CM | POA: Diagnosis not present

## 2016-11-14 DIAGNOSIS — G8929 Other chronic pain: Secondary | ICD-10-CM | POA: Diagnosis not present

## 2016-11-14 DIAGNOSIS — M316 Other giant cell arteritis: Secondary | ICD-10-CM | POA: Diagnosis not present

## 2016-11-14 DIAGNOSIS — Z79899 Other long term (current) drug therapy: Secondary | ICD-10-CM | POA: Diagnosis not present

## 2016-11-16 ENCOUNTER — Ambulatory Visit
Admission: RE | Admit: 2016-11-16 | Discharge: 2016-11-16 | Disposition: A | Discharge status: Home / Self Care | Payer: PPO | Source: Ambulatory Visit | Attending: Ophthalmology | Admitting: Ophthalmology

## 2016-11-16 ENCOUNTER — Ambulatory Visit: Payer: PPO | Admitting: Anesthesiology

## 2016-11-16 ENCOUNTER — Encounter
Admission: RE | Disposition: A | Discharge status: Home / Self Care | Payer: Self-pay | Source: Ambulatory Visit | Attending: Ophthalmology

## 2016-11-16 DIAGNOSIS — K219 Gastro-esophageal reflux disease without esophagitis: Secondary | ICD-10-CM | POA: Insufficient documentation

## 2016-11-16 DIAGNOSIS — H2511 Age-related nuclear cataract, right eye: Secondary | ICD-10-CM | POA: Diagnosis not present

## 2016-11-16 DIAGNOSIS — I10 Essential (primary) hypertension: Secondary | ICD-10-CM | POA: Diagnosis not present

## 2016-11-16 HISTORY — PX: CATARACT EXTRACTION W/PHACO: SHX586

## 2016-11-16 LAB — GLUCOSE, CAPILLARY
Glucose-Capillary: 78 mg/dL (ref 65–99)
Glucose-Capillary: 107 mg/dL — ABNORMAL HIGH (ref 65–99)

## 2016-11-16 SURGERY — PHACOEMULSIFICATION, CATARACT, WITH IOL INSERTION
Anesthesia: Monitor Anesthesia Care | Site: Eye | Laterality: Right | Wound class: Clean

## 2016-11-16 MED ORDER — CEFUROXIME OPHTHALMIC INJECTION 1 MG/0.1 ML
INJECTION | OPHTHALMIC | Status: DC | PRN
  Administered 2016-11-16: .3 mL via OPHTHALMIC

## 2016-11-16 MED ORDER — MOXIFLOXACIN HCL 0.5 % OP SOLN
1.0000 [drp] | OPHTHALMIC | Status: DC | PRN
  Administered 2016-11-16 (×3): 1 [drp] via OPHTHALMIC

## 2016-11-16 MED ORDER — ACETAMINOPHEN 160 MG/5ML PO SOLN: 325.0000 mg | ORAL | Status: DC | PRN

## 2016-11-16 MED ORDER — FENTANYL CITRATE (PF) 100 MCG/2ML IJ SOLN
INTRAMUSCULAR | Status: DC | PRN
  Administered 2016-11-16: 50 ug via INTRAVENOUS

## 2016-11-16 MED ORDER — BRIMONIDINE TARTRATE-TIMOLOL 0.2-0.5 % OP SOLN
OPHTHALMIC | Status: DC | PRN
  Administered 2016-11-16: 1 [drp] via OPHTHALMIC

## 2016-11-16 MED ORDER — LIDOCAINE HCL (PF) 2 % IJ SOLN: INTRAMUSCULAR | Status: DC | PRN

## 2016-11-16 MED ORDER — MIDAZOLAM HCL 2 MG/2ML IJ SOLN
INTRAMUSCULAR | Status: DC | PRN
  Administered 2016-11-16: 2 mg via INTRAVENOUS

## 2016-11-16 MED ORDER — ACETAMINOPHEN 325 MG PO TABS: 325.0000 mg | ORAL_TABLET | ORAL | Status: DC | PRN

## 2016-11-16 MED ORDER — ARMC OPHTHALMIC DILATING DROPS
1.0000 "application " | OPHTHALMIC | Status: DC | PRN
  Administered 2016-11-16 (×3): 1 via OPHTHALMIC

## 2016-11-16 MED ORDER — NA HYALUR & NA CHOND-NA HYALUR 0.4-0.35 ML IO KIT
PACK | INTRAOCULAR | Status: DC | PRN
  Administered 2016-11-16: 1 mL via INTRAOCULAR

## 2016-11-16 MED ORDER — LIDOCAINE HCL (PF) 2 % IJ SOLN
INTRAMUSCULAR | Status: DC | PRN
  Administered 2016-11-16: 1 mL via INTRAOCULAR

## 2016-11-16 MED ORDER — NEOMYCIN-POLYMYXIN-DEXAMETH 3.5-10000-0.1 OP OINT
TOPICAL_OINTMENT | OPHTHALMIC | Status: DC | PRN
  Administered 2016-11-16: 1 via OPHTHALMIC

## 2016-11-16 MED ORDER — EPINEPHRINE PF 1 MG/ML IJ SOLN
INTRAOCULAR | Status: DC | PRN
  Administered 2016-11-16: 60 mL via OPHTHALMIC

## 2016-11-16 SURGICAL SUPPLY — 25 items
CANNULA ANT/CHMB 27GA (MISCELLANEOUS) ×3 IMPLANT
CARTRIDGE ABBOTT (MISCELLANEOUS) IMPLANT
GLOVE SURG LX 7.5 STRW (GLOVE) ×2
GLOVE SURG LX STRL 7.5 STRW (GLOVE) ×1 IMPLANT
GLOVE SURG TRIUMPH 8.0 PF LTX (GLOVE) ×3 IMPLANT
GOWN STRL REUS W/ TWL LRG LVL3 (GOWN DISPOSABLE) ×2 IMPLANT
GOWN STRL REUS W/TWL LRG LVL3 (GOWN DISPOSABLE) ×4
LENS IOL TECNIS ITEC 18.5 (Intraocular Lens) ×3 IMPLANT
MARKER SKIN DUAL TIP RULER LAB (MISCELLANEOUS) ×3 IMPLANT
NDL RETROBULBAR .5 NSTRL (NEEDLE) IMPLANT
NEEDLE FILTER BLUNT 18X 1/2SAF (NEEDLE) ×2
NEEDLE FILTER BLUNT 18X1 1/2 (NEEDLE) ×1 IMPLANT
PACK CATARACT BRASINGTON (MISCELLANEOUS) ×3 IMPLANT
PACK EYE AFTER SURG (MISCELLANEOUS) ×3 IMPLANT
PACK OPTHALMIC (MISCELLANEOUS) ×3 IMPLANT
RING MALYGIN 7.0 (MISCELLANEOUS) IMPLANT
SUT ETHILON 10-0 CS-B-6CS-B-6 (SUTURE)
SUT VICRYL  9 0 (SUTURE)
SUT VICRYL 9 0 (SUTURE) IMPLANT
SUTURE EHLN 10-0 CS-B-6CS-B-6 (SUTURE) IMPLANT
SYR 3ML LL SCALE MARK (SYRINGE) ×3 IMPLANT
SYR 5ML LL (SYRINGE) ×3 IMPLANT
SYR TB 1ML LUER SLIP (SYRINGE) ×3 IMPLANT
WATER STERILE IRR 250ML POUR (IV SOLUTION) ×3 IMPLANT
WIPE NON LINTING 3.25X3.25 (MISCELLANEOUS) ×3 IMPLANT

## 2016-11-16 NOTE — Op Note (Signed)
LOCATION:  [place]   PREOPERATIVE DIAGNOSIS:    Nuclear sclerotic cataract right eye. H25.11   POSTOPERATIVE DIAGNOSIS:  Nuclear sclerotic cataract right eye.     PROCEDURE:  Phacoemusification with posterior chamber intraocular lens placement of the right eye   LENS:   Implant Name Type Inv. Item Serial No. Manufacturer Lot No. LRB No. Used  LENS IOL DIOP 18.5 - [id number] Intraocular Lens LENS IOL DIOP 18.5 [id number] AMO   Right 1        ULTRASOUND TIME: 19 % of 1 minutes, 15 seconds.  CDE 14.6   SURGEON:  Wyonia Hough, MD   ANESTHESIA:  Topical with tetracaine drops and 2% Xylocaine jelly, augmented with 1% preservative-free intracameral lidocaine.    COMPLICATIONS:  None.   DESCRIPTION OF PROCEDURE:  The patient was identified in the holding room and transported to the operating room and placed in the supine position under the operating microscope.  The right eye was identified as the operative eye and it was prepped and draped in the usual sterile ophthalmic fashion.   A 1 millimeter clear-corneal paracentesis was made at the 12:00 position.  0.5 ml of preservative-free 1% lidocaine was injected into the anterior chamber. The anterior chamber was filled with Viscoat viscoelastic.  A 2.4 millimeter keratome was used to make a near-clear corneal incision at the 9:00 position.  A curvilinear capsulorrhexis was made with a cystotome and capsulorrhexis forceps.  Balanced salt solution was used to hydrodissect and hydrodelineate the nucleus.   Phacoemulsification was then used in stop and chop fashion to remove the lens nucleus and epinucleus.  The remaining cortex was then removed using the irrigation and aspiration handpiece. Provisc was then placed into the capsular bag to distend it for lens placement.  A lens was then injected into the capsular bag.  The remaining viscoelastic was aspirated.   Wounds were hydrated with balanced salt solution.  The anterior  chamber was inflated to a physiologic pressure with balanced salt solution.  No wound leaks were noted. Cefuroxime 0.1 ml of a 10mg /ml solution was injected into the anterior chamber for a dose of 1 mg of intracameral antibiotic at the completion of the case.   Timolol and Brimonidine drops were applied to the eye. Maxitrol ointment was placed on the eye and the eye was patched and shielded.   The patient was taken to the recovery room in stable condition without complications of anesthesia or surgery.   Wanda Monroe 11/16/2016, 8:03 AM

## 2016-11-16 NOTE — Anesthesia Postprocedure Evaluation (Signed)
Anesthesia Post Note  Patient: Wanda Monroe  Procedure(s) Performed: Procedure(s) (LRB): CATARACT EXTRACTION PHACO AND INTRAOCULAR LENS PLACEMENT ([facility]) dIABETIC (Right)  Patient location during evaluation: PACU Anesthesia Type: MAC Level of consciousness: awake and alert Pain management: pain level controlled Vital Signs Assessment: post-procedure vital signs reviewed and stable Respiratory status: spontaneous breathing, nonlabored ventilation, respiratory function stable and patient connected to nasal cannula oxygen Cardiovascular status: stable and blood pressure returned to baseline Anesthetic complications: no    Alisa Graff

## 2016-11-16 NOTE — Transfer of Care (Signed)
Immediate Anesthesia Transfer of Care Note  Patient: Wanda Monroe  Procedure(s) Performed: Procedure(s) with comments: CATARACT EXTRACTION PHACO AND INTRAOCULAR LENS PLACEMENT (IOC) dIABETIC (Right) - DIABETES - oral meds  Patient Location: PACU  Anesthesia Type: MAC  Level of Consciousness: awake, alert  and patient cooperative  Airway and Oxygen Therapy: Patient Spontanous Breathing and Patient connected to supplemental oxygen  Post-op Assessment: Post-op Vital signs reviewed, Patient's Cardiovascular Status Stable, Respiratory Function Stable, Patent Airway and No signs of Nausea or vomiting  Post-op Vital Signs: Reviewed and stable  Complications: No apparent anesthesia complications

## 2016-11-16 NOTE — Anesthesia Preprocedure Evaluation (Signed)
Anesthesia Evaluation  Patient identified by MRN, date of birth, ID band Patient awake    Reviewed: Allergy & Precautions, H&P , NPO status , Patient's Chart, lab work & pertinent test results, reviewed documented beta blocker date and time   Airway Mallampati: II  TM Distance: >3 FB Neck ROM: full    Dental no notable dental hx.    Pulmonary neg pulmonary ROS,    Pulmonary exam normal breath sounds clear to auscultation       Cardiovascular Exercise Tolerance: Good hypertension, Normal cardiovascular exam Rhythm:regular Rate:Normal     Neuro/Psych CVA negative psych ROS   GI/Hepatic Neg liver ROS, GERD  ,  Endo/Other  negative endocrine ROSdiabetes  Renal/GU negative Renal ROS  negative genitourinary   Musculoskeletal   Abdominal   Peds  Hematology negative hematology ROS (+)   Anesthesia Other Findings   Reproductive/Obstetrics negative OB ROS                             Anesthesia Physical  Anesthesia Plan  ASA: II  Anesthesia Plan: MAC   Post-op Pain Management:    Induction:   Airway Management Planned:   Additional Equipment:   Intra-op Plan:   Post-operative Plan:   Informed Consent: I have reviewed the patients History and Physical, chart, labs and discussed the procedure including the risks, benefits and alternatives for the proposed anesthesia with the patient or authorized representative who has indicated his/her understanding and acceptance.   Dental Advisory Given  Plan Discussed with: CRNA  Anesthesia Plan Comments:         Anesthesia Quick Evaluation

## 2016-11-16 NOTE — Anesthesia Procedure Notes (Signed)
Procedure Name: MAC Performed by: Mayme Genta Pre-anesthesia Checklist: Patient identified, Emergency Drugs available, Suction available, Timeout performed and Patient being monitored Patient Re-evaluated:Patient Re-evaluated prior to inductionOxygen Delivery Method: Nasal cannula Placement Confirmation: positive ETCO2

## 2016-11-16 NOTE — H&P (Signed)
The History and Physical notes are on paper, have been signed, and are to be scanned. The patient remains stable and unchanged from the H&P.   Previous H&P reviewed, patient examined, and there are no changes.  Wanda Monroe 11/16/2016 7:40 AM

## 2016-11-17 ENCOUNTER — Encounter: Payer: Self-pay | Admitting: Ophthalmology

## 2016-12-15 ENCOUNTER — Other Ambulatory Visit: Payer: Self-pay | Admitting: Student

## 2016-12-15 DIAGNOSIS — R197 Diarrhea, unspecified: Secondary | ICD-10-CM | POA: Diagnosis not present

## 2016-12-15 DIAGNOSIS — R634 Abnormal weight loss: Secondary | ICD-10-CM | POA: Diagnosis not present

## 2016-12-15 DIAGNOSIS — R110 Nausea: Secondary | ICD-10-CM | POA: Diagnosis not present

## 2016-12-15 DIAGNOSIS — R1013 Epigastric pain: Secondary | ICD-10-CM | POA: Diagnosis not present

## 2016-12-16 ENCOUNTER — Other Ambulatory Visit
Admission: RE | Admit: 2016-12-16 | Discharge: 2016-12-16 | Disposition: A | Discharge status: Home / Self Care | Payer: PPO | Source: Ambulatory Visit | Attending: Student | Admitting: Student

## 2016-12-16 DIAGNOSIS — R197 Diarrhea, unspecified: Secondary | ICD-10-CM | POA: Diagnosis not present

## 2016-12-16 LAB — GASTROINTESTINAL PANEL BY PCR, STOOL (REPLACES STOOL CULTURE)

## 2016-12-16 LAB — C DIFFICILE QUICK SCREEN W PCR REFLEX
C DIFFICLE (CDIFF) ANTIGEN: NEGATIVE
C Diff interpretation: NOT DETECTED
C Diff toxin: NEGATIVE

## 2016-12-20 LAB — PANCREATIC ELASTASE, FECAL

## 2016-12-23 ENCOUNTER — Ambulatory Visit
Admission: RE | Admit: 2016-12-23 | Discharge: 2016-12-23 | Disposition: A | Discharge status: Home / Self Care | Payer: PPO | Source: Ambulatory Visit | Attending: Student | Admitting: Student

## 2016-12-23 DIAGNOSIS — M47895 Other spondylosis, thoracolumbar region: Secondary | ICD-10-CM | POA: Diagnosis not present

## 2016-12-23 DIAGNOSIS — K760 Fatty (change of) liver, not elsewhere classified: Secondary | ICD-10-CM | POA: Diagnosis not present

## 2016-12-23 DIAGNOSIS — R197 Diarrhea, unspecified: Secondary | ICD-10-CM | POA: Diagnosis not present

## 2016-12-23 DIAGNOSIS — K449 Diaphragmatic hernia without obstruction or gangrene: Secondary | ICD-10-CM | POA: Diagnosis not present

## 2016-12-23 DIAGNOSIS — R634 Abnormal weight loss: Secondary | ICD-10-CM | POA: Diagnosis not present

## 2016-12-23 DIAGNOSIS — Z90710 Acquired absence of both cervix and uterus: Secondary | ICD-10-CM | POA: Diagnosis not present

## 2016-12-23 DIAGNOSIS — R1110 Vomiting, unspecified: Secondary | ICD-10-CM | POA: Diagnosis not present

## 2016-12-23 LAB — POCT I-STAT CREATININE: Creatinine, Ser: 0.6 mg/dL (ref 0.44–1.00)

## 2016-12-23 MED ORDER — IOPAMIDOL (ISOVUE-300) INJECTION 61%
100.0000 mL | Freq: Once | INTRAVENOUS | Status: AC | PRN
  Administered 2016-12-23: 100 mL via INTRAVENOUS

## 2017-01-02 ENCOUNTER — Other Ambulatory Visit: Payer: Self-pay | Admitting: Internal Medicine

## 2017-01-02 DIAGNOSIS — Z79899 Other long term (current) drug therapy: Secondary | ICD-10-CM | POA: Diagnosis not present

## 2017-01-02 DIAGNOSIS — M0579 Rheumatoid arthritis with rheumatoid factor of multiple sites without organ or systems involvement: Secondary | ICD-10-CM | POA: Diagnosis not present

## 2017-01-02 DIAGNOSIS — E119 Type 2 diabetes mellitus without complications: Secondary | ICD-10-CM | POA: Diagnosis not present

## 2017-01-02 DIAGNOSIS — Z1231 Encounter for screening mammogram for malignant neoplasm of breast: Secondary | ICD-10-CM

## 2017-01-02 DIAGNOSIS — M069 Rheumatoid arthritis, unspecified: Secondary | ICD-10-CM | POA: Diagnosis not present

## 2017-01-02 DIAGNOSIS — I10 Essential (primary) hypertension: Secondary | ICD-10-CM | POA: Diagnosis not present

## 2017-01-09 DIAGNOSIS — D649 Anemia, unspecified: Secondary | ICD-10-CM | POA: Diagnosis not present

## 2017-01-09 DIAGNOSIS — E7800 Pure hypercholesterolemia, unspecified: Secondary | ICD-10-CM | POA: Diagnosis not present

## 2017-01-09 DIAGNOSIS — M316 Other giant cell arteritis: Secondary | ICD-10-CM | POA: Diagnosis not present

## 2017-01-09 DIAGNOSIS — Z1329 Encounter for screening for other suspected endocrine disorder: Secondary | ICD-10-CM | POA: Diagnosis not present

## 2017-01-09 DIAGNOSIS — Z0000 Encounter for general adult medical examination without abnormal findings: Secondary | ICD-10-CM | POA: Diagnosis not present

## 2017-01-09 DIAGNOSIS — E119 Type 2 diabetes mellitus without complications: Secondary | ICD-10-CM | POA: Diagnosis not present

## 2017-01-09 DIAGNOSIS — B001 Herpesviral vesicular dermatitis: Secondary | ICD-10-CM | POA: Diagnosis not present

## 2017-01-09 DIAGNOSIS — R8290 Unspecified abnormal findings in urine: Secondary | ICD-10-CM | POA: Diagnosis not present

## 2017-01-09 DIAGNOSIS — R634 Abnormal weight loss: Secondary | ICD-10-CM | POA: Diagnosis not present

## 2017-01-09 DIAGNOSIS — I10 Essential (primary) hypertension: Secondary | ICD-10-CM | POA: Diagnosis not present

## 2017-01-09 DIAGNOSIS — Z1231 Encounter for screening mammogram for malignant neoplasm of breast: Secondary | ICD-10-CM | POA: Diagnosis not present

## 2017-01-09 DIAGNOSIS — J439 Emphysema, unspecified: Secondary | ICD-10-CM | POA: Diagnosis not present

## 2017-01-23 ENCOUNTER — Ambulatory Visit
Admission: RE | Admit: 2017-01-23 | Discharge: 2017-01-23 | Disposition: A | Discharge status: Home / Self Care | Payer: PPO | Source: Ambulatory Visit | Attending: Internal Medicine | Admitting: Internal Medicine

## 2017-01-23 DIAGNOSIS — Z1231 Encounter for screening mammogram for malignant neoplasm of breast: Secondary | ICD-10-CM | POA: Diagnosis not present

## 2017-01-30 DIAGNOSIS — M316 Other giant cell arteritis: Secondary | ICD-10-CM | POA: Diagnosis not present

## 2017-04-24 DIAGNOSIS — H401132 Primary open-angle glaucoma, bilateral, moderate stage: Secondary | ICD-10-CM | POA: Diagnosis not present

## 2017-05-01 DIAGNOSIS — M1611 Unilateral primary osteoarthritis, right hip: Secondary | ICD-10-CM | POA: Diagnosis not present

## 2017-05-01 DIAGNOSIS — M48061 Spinal stenosis, lumbar region without neurogenic claudication: Secondary | ICD-10-CM | POA: Diagnosis not present

## 2017-05-01 DIAGNOSIS — M25561 Pain in right knee: Secondary | ICD-10-CM | POA: Diagnosis not present

## 2017-05-01 DIAGNOSIS — M179 Osteoarthritis of knee, unspecified: Secondary | ICD-10-CM | POA: Diagnosis not present

## 2017-05-01 DIAGNOSIS — E7800 Pure hypercholesterolemia, unspecified: Secondary | ICD-10-CM | POA: Diagnosis not present

## 2017-05-01 DIAGNOSIS — I10 Essential (primary) hypertension: Secondary | ICD-10-CM | POA: Diagnosis not present

## 2017-05-01 DIAGNOSIS — M1990 Unspecified osteoarthritis, unspecified site: Secondary | ICD-10-CM | POA: Diagnosis not present

## 2017-05-01 DIAGNOSIS — Z79899 Other long term (current) drug therapy: Secondary | ICD-10-CM | POA: Diagnosis not present

## 2017-05-01 DIAGNOSIS — E119 Type 2 diabetes mellitus without complications: Secondary | ICD-10-CM | POA: Diagnosis not present

## 2017-05-01 DIAGNOSIS — M25551 Pain in right hip: Secondary | ICD-10-CM | POA: Diagnosis not present

## 2017-05-02 DIAGNOSIS — H401132 Primary open-angle glaucoma, bilateral, moderate stage: Secondary | ICD-10-CM | POA: Diagnosis not present

## 2017-05-17 DIAGNOSIS — M0579 Rheumatoid arthritis with rheumatoid factor of multiple sites without organ or systems involvement: Secondary | ICD-10-CM | POA: Diagnosis not present

## 2017-05-17 DIAGNOSIS — M7061 Trochanteric bursitis, right hip: Secondary | ICD-10-CM | POA: Diagnosis not present

## 2017-05-17 DIAGNOSIS — M316 Other giant cell arteritis: Secondary | ICD-10-CM | POA: Diagnosis not present

## 2017-05-17 DIAGNOSIS — M150 Primary generalized (osteo)arthritis: Secondary | ICD-10-CM | POA: Diagnosis not present

## 2017-05-17 DIAGNOSIS — Z79899 Other long term (current) drug therapy: Secondary | ICD-10-CM | POA: Diagnosis not present

## 2017-07-31 DIAGNOSIS — M316 Other giant cell arteritis: Secondary | ICD-10-CM | POA: Diagnosis not present

## 2017-07-31 DIAGNOSIS — M79641 Pain in right hand: Secondary | ICD-10-CM | POA: Diagnosis not present

## 2017-07-31 DIAGNOSIS — M0579 Rheumatoid arthritis with rheumatoid factor of multiple sites without organ or systems involvement: Secondary | ICD-10-CM | POA: Diagnosis not present

## 2017-08-09 DIAGNOSIS — R634 Abnormal weight loss: Secondary | ICD-10-CM | POA: Diagnosis not present

## 2017-08-09 DIAGNOSIS — R197 Diarrhea, unspecified: Secondary | ICD-10-CM | POA: Diagnosis not present

## 2017-08-09 DIAGNOSIS — I10 Essential (primary) hypertension: Secondary | ICD-10-CM | POA: Diagnosis not present

## 2017-08-09 DIAGNOSIS — E119 Type 2 diabetes mellitus without complications: Secondary | ICD-10-CM | POA: Diagnosis not present

## 2017-08-09 DIAGNOSIS — E7800 Pure hypercholesterolemia, unspecified: Secondary | ICD-10-CM | POA: Diagnosis not present

## 2017-08-09 DIAGNOSIS — Z79899 Other long term (current) drug therapy: Secondary | ICD-10-CM | POA: Diagnosis not present

## 2017-08-15 DIAGNOSIS — E7800 Pure hypercholesterolemia, unspecified: Secondary | ICD-10-CM | POA: Diagnosis not present

## 2017-08-15 DIAGNOSIS — R197 Diarrhea, unspecified: Secondary | ICD-10-CM | POA: Diagnosis not present

## 2017-08-15 DIAGNOSIS — Z79899 Other long term (current) drug therapy: Secondary | ICD-10-CM | POA: Diagnosis not present

## 2017-08-15 DIAGNOSIS — E119 Type 2 diabetes mellitus without complications: Secondary | ICD-10-CM | POA: Diagnosis not present

## 2017-08-15 DIAGNOSIS — M0579 Rheumatoid arthritis with rheumatoid factor of multiple sites without organ or systems involvement: Secondary | ICD-10-CM | POA: Diagnosis not present

## 2017-08-28 DIAGNOSIS — M316 Other giant cell arteritis: Secondary | ICD-10-CM | POA: Diagnosis not present

## 2017-08-28 DIAGNOSIS — M79641 Pain in right hand: Secondary | ICD-10-CM | POA: Diagnosis not present

## 2017-08-28 DIAGNOSIS — G8929 Other chronic pain: Secondary | ICD-10-CM | POA: Diagnosis not present

## 2017-08-28 DIAGNOSIS — M25561 Pain in right knee: Secondary | ICD-10-CM | POA: Diagnosis not present

## 2017-08-28 DIAGNOSIS — M0579 Rheumatoid arthritis with rheumatoid factor of multiple sites without organ or systems involvement: Secondary | ICD-10-CM | POA: Diagnosis not present

## 2017-09-26 DIAGNOSIS — R768 Other specified abnormal immunological findings in serum: Secondary | ICD-10-CM | POA: Diagnosis not present

## 2017-09-26 DIAGNOSIS — R350 Frequency of micturition: Secondary | ICD-10-CM | POA: Diagnosis not present

## 2017-09-26 DIAGNOSIS — K219 Gastro-esophageal reflux disease without esophagitis: Secondary | ICD-10-CM | POA: Diagnosis not present

## 2017-09-26 DIAGNOSIS — R634 Abnormal weight loss: Secondary | ICD-10-CM | POA: Diagnosis not present

## 2017-09-26 DIAGNOSIS — K529 Noninfective gastroenteritis and colitis, unspecified: Secondary | ICD-10-CM | POA: Diagnosis not present

## 2017-10-30 DIAGNOSIS — H401132 Primary open-angle glaucoma, bilateral, moderate stage: Secondary | ICD-10-CM | POA: Diagnosis not present

## 2017-11-14 DIAGNOSIS — M79641 Pain in right hand: Secondary | ICD-10-CM | POA: Diagnosis not present

## 2017-11-14 DIAGNOSIS — Z79899 Other long term (current) drug therapy: Secondary | ICD-10-CM | POA: Diagnosis not present

## 2017-11-14 DIAGNOSIS — M0579 Rheumatoid arthritis with rheumatoid factor of multiple sites without organ or systems involvement: Secondary | ICD-10-CM | POA: Diagnosis not present

## 2017-11-14 DIAGNOSIS — M316 Other giant cell arteritis: Secondary | ICD-10-CM | POA: Diagnosis not present

## 2017-11-21 DIAGNOSIS — R6889 Other general symptoms and signs: Secondary | ICD-10-CM | POA: Diagnosis not present

## 2017-11-21 DIAGNOSIS — J40 Bronchitis, not specified as acute or chronic: Secondary | ICD-10-CM | POA: Diagnosis not present

## 2018-01-01 ENCOUNTER — Other Ambulatory Visit: Payer: Self-pay | Admitting: Internal Medicine

## 2018-01-01 DIAGNOSIS — Z1231 Encounter for screening mammogram for malignant neoplasm of breast: Secondary | ICD-10-CM

## 2018-01-15 DIAGNOSIS — M79641 Pain in right hand: Secondary | ICD-10-CM | POA: Diagnosis not present

## 2018-01-15 DIAGNOSIS — M0579 Rheumatoid arthritis with rheumatoid factor of multiple sites without organ or systems involvement: Secondary | ICD-10-CM | POA: Diagnosis not present

## 2018-01-15 DIAGNOSIS — M25531 Pain in right wrist: Secondary | ICD-10-CM | POA: Diagnosis not present

## 2018-01-24 ENCOUNTER — Ambulatory Visit
Admission: RE | Admit: 2018-01-24 | Discharge: 2018-01-24 | Disposition: A | Discharge status: Home / Self Care | Payer: PPO | Source: Ambulatory Visit | Attending: Internal Medicine | Admitting: Internal Medicine

## 2018-01-24 DIAGNOSIS — Z1231 Encounter for screening mammogram for malignant neoplasm of breast: Secondary | ICD-10-CM | POA: Insufficient documentation

## 2018-02-13 DIAGNOSIS — M0579 Rheumatoid arthritis with rheumatoid factor of multiple sites without organ or systems involvement: Secondary | ICD-10-CM | POA: Diagnosis not present

## 2018-02-13 DIAGNOSIS — Z79899 Other long term (current) drug therapy: Secondary | ICD-10-CM | POA: Diagnosis not present

## 2018-02-20 DIAGNOSIS — Z79899 Other long term (current) drug therapy: Secondary | ICD-10-CM | POA: Diagnosis not present

## 2018-02-20 DIAGNOSIS — M316 Other giant cell arteritis: Secondary | ICD-10-CM | POA: Diagnosis not present

## 2018-02-20 DIAGNOSIS — M7061 Trochanteric bursitis, right hip: Secondary | ICD-10-CM | POA: Diagnosis not present

## 2018-02-20 DIAGNOSIS — M25561 Pain in right knee: Secondary | ICD-10-CM | POA: Diagnosis not present

## 2018-02-20 DIAGNOSIS — M0579 Rheumatoid arthritis with rheumatoid factor of multiple sites without organ or systems involvement: Secondary | ICD-10-CM | POA: Diagnosis not present

## 2018-02-26 DIAGNOSIS — E119 Type 2 diabetes mellitus without complications: Secondary | ICD-10-CM | POA: Diagnosis not present

## 2018-02-26 DIAGNOSIS — I10 Essential (primary) hypertension: Secondary | ICD-10-CM | POA: Diagnosis not present

## 2018-02-26 DIAGNOSIS — E7800 Pure hypercholesterolemia, unspecified: Secondary | ICD-10-CM | POA: Diagnosis not present

## 2018-02-26 DIAGNOSIS — M1990 Unspecified osteoarthritis, unspecified site: Secondary | ICD-10-CM | POA: Diagnosis not present

## 2018-02-26 DIAGNOSIS — Z0000 Encounter for general adult medical examination without abnormal findings: Secondary | ICD-10-CM | POA: Diagnosis not present

## 2018-02-26 DIAGNOSIS — Z79899 Other long term (current) drug therapy: Secondary | ICD-10-CM | POA: Diagnosis not present

## 2018-02-26 DIAGNOSIS — K529 Noninfective gastroenteritis and colitis, unspecified: Secondary | ICD-10-CM | POA: Diagnosis not present

## 2018-02-26 DIAGNOSIS — M810 Age-related osteoporosis without current pathological fracture: Secondary | ICD-10-CM | POA: Diagnosis not present

## 2018-03-05 DIAGNOSIS — M8588 Other specified disorders of bone density and structure, other site: Secondary | ICD-10-CM | POA: Diagnosis not present

## 2018-03-20 DIAGNOSIS — K529 Noninfective gastroenteritis and colitis, unspecified: Secondary | ICD-10-CM | POA: Diagnosis not present

## 2018-03-20 DIAGNOSIS — R768 Other specified abnormal immunological findings in serum: Secondary | ICD-10-CM | POA: Diagnosis not present

## 2018-03-20 DIAGNOSIS — K219 Gastro-esophageal reflux disease without esophagitis: Secondary | ICD-10-CM | POA: Diagnosis not present

## 2018-03-20 DIAGNOSIS — R634 Abnormal weight loss: Secondary | ICD-10-CM | POA: Diagnosis not present

## 2018-03-22 ENCOUNTER — Other Ambulatory Visit
Admission: RE | Admit: 2018-03-22 | Discharge: 2018-03-22 | Disposition: A | Discharge status: Home / Self Care | Payer: PPO | Source: Ambulatory Visit | Attending: Student | Admitting: Student

## 2018-03-22 DIAGNOSIS — K529 Noninfective gastroenteritis and colitis, unspecified: Secondary | ICD-10-CM | POA: Insufficient documentation

## 2018-03-22 LAB — GASTROINTESTINAL PANEL BY PCR, STOOL (REPLACES STOOL CULTURE)
ADENOVIRUS F40/41: NOT DETECTED
ASTROVIRUS: NOT DETECTED
CAMPYLOBACTER SPECIES: NOT DETECTED
Cryptosporidium: NOT DETECTED
Cyclospora cayetanensis: NOT DETECTED
ENTEROTOXIGENIC E COLI (ETEC): NOT DETECTED
Entamoeba histolytica: NOT DETECTED
Enteroaggregative E coli (EAEC): NOT DETECTED
Enteropathogenic E coli (EPEC): NOT DETECTED
Giardia lamblia: NOT DETECTED
NOROVIRUS GI/GII: NOT DETECTED
PLESIMONAS SHIGELLOIDES: NOT DETECTED
Rotavirus A: NOT DETECTED
SAPOVIRUS (I, II, IV, AND V): NOT DETECTED
SHIGA LIKE TOXIN PRODUCING E COLI (STEC): NOT DETECTED
Salmonella species: NOT DETECTED
Shigella/Enteroinvasive E coli (EIEC): NOT DETECTED
VIBRIO SPECIES: NOT DETECTED
Vibrio cholerae: NOT DETECTED
Yersinia enterocolitica: NOT DETECTED

## 2018-03-22 LAB — C DIFFICILE QUICK SCREEN W PCR REFLEX
C DIFFICILE (CDIFF) INTERP: NOT DETECTED
C DIFFICILE (CDIFF) TOXIN: NEGATIVE
C DIFFICLE (CDIFF) ANTIGEN: NEGATIVE

## 2018-03-23 LAB — CALPROTECTIN, FECAL: CALPROTECTIN, FECAL: 97 ug/g (ref 0–120)

## 2018-04-23 ENCOUNTER — Encounter: Payer: Self-pay | Admitting: *Deleted

## 2018-04-24 ENCOUNTER — Ambulatory Visit
Admission: RE | Admit: 2018-04-24 | Discharge: 2018-04-24 | Disposition: A | Discharge status: Home / Self Care | Payer: PPO | Source: Ambulatory Visit | Attending: Internal Medicine | Admitting: Internal Medicine

## 2018-04-24 ENCOUNTER — Ambulatory Visit: Payer: PPO | Admitting: Certified Registered Nurse Anesthetist

## 2018-04-24 ENCOUNTER — Encounter
Admission: RE | Disposition: A | Discharge status: Home / Self Care | Payer: Self-pay | Source: Ambulatory Visit | Attending: Internal Medicine

## 2018-04-24 ENCOUNTER — Encounter: Payer: Self-pay | Admitting: Anesthesiology

## 2018-04-24 DIAGNOSIS — M069 Rheumatoid arthritis, unspecified: Secondary | ICD-10-CM | POA: Diagnosis not present

## 2018-04-24 DIAGNOSIS — K591 Functional diarrhea: Secondary | ICD-10-CM | POA: Insufficient documentation

## 2018-04-24 DIAGNOSIS — R634 Abnormal weight loss: Secondary | ICD-10-CM | POA: Insufficient documentation

## 2018-04-24 DIAGNOSIS — E119 Type 2 diabetes mellitus without complications: Secondary | ICD-10-CM | POA: Diagnosis not present

## 2018-04-24 DIAGNOSIS — I10 Essential (primary) hypertension: Secondary | ICD-10-CM | POA: Diagnosis not present

## 2018-04-24 DIAGNOSIS — A048 Other specified bacterial intestinal infections: Secondary | ICD-10-CM | POA: Diagnosis not present

## 2018-04-24 DIAGNOSIS — Z885 Allergy status to narcotic agent status: Secondary | ICD-10-CM | POA: Diagnosis not present

## 2018-04-24 DIAGNOSIS — R197 Diarrhea, unspecified: Secondary | ICD-10-CM | POA: Diagnosis not present

## 2018-04-24 DIAGNOSIS — K449 Diaphragmatic hernia without obstruction or gangrene: Secondary | ICD-10-CM | POA: Insufficient documentation

## 2018-04-24 DIAGNOSIS — E1140 Type 2 diabetes mellitus with diabetic neuropathy, unspecified: Secondary | ICD-10-CM | POA: Insufficient documentation

## 2018-04-24 DIAGNOSIS — Z888 Allergy status to other drugs, medicaments and biological substances status: Secondary | ICD-10-CM | POA: Diagnosis not present

## 2018-04-24 DIAGNOSIS — K2950 Unspecified chronic gastritis without bleeding: Secondary | ICD-10-CM | POA: Diagnosis not present

## 2018-04-24 DIAGNOSIS — K2970 Gastritis, unspecified, without bleeding: Secondary | ICD-10-CM | POA: Diagnosis not present

## 2018-04-24 DIAGNOSIS — K5730 Diverticulosis of large intestine without perforation or abscess without bleeding: Secondary | ICD-10-CM | POA: Insufficient documentation

## 2018-04-24 DIAGNOSIS — K648 Other hemorrhoids: Secondary | ICD-10-CM | POA: Diagnosis not present

## 2018-04-24 DIAGNOSIS — K5790 Diverticulosis of intestine, part unspecified, without perforation or abscess without bleeding: Secondary | ICD-10-CM | POA: Diagnosis not present

## 2018-04-24 DIAGNOSIS — Z7982 Long term (current) use of aspirin: Secondary | ICD-10-CM | POA: Insufficient documentation

## 2018-04-24 DIAGNOSIS — M810 Age-related osteoporosis without current pathological fracture: Secondary | ICD-10-CM | POA: Insufficient documentation

## 2018-04-24 DIAGNOSIS — Z8673 Personal history of transient ischemic attack (TIA), and cerebral infarction without residual deficits: Secondary | ICD-10-CM | POA: Insufficient documentation

## 2018-04-24 DIAGNOSIS — K6389 Other specified diseases of intestine: Secondary | ICD-10-CM | POA: Diagnosis not present

## 2018-04-24 DIAGNOSIS — E785 Hyperlipidemia, unspecified: Secondary | ICD-10-CM | POA: Insufficient documentation

## 2018-04-24 DIAGNOSIS — K529 Noninfective gastroenteritis and colitis, unspecified: Secondary | ICD-10-CM | POA: Diagnosis not present

## 2018-04-24 DIAGNOSIS — K219 Gastro-esophageal reflux disease without esophagitis: Secondary | ICD-10-CM | POA: Insufficient documentation

## 2018-04-24 DIAGNOSIS — K640 First degree hemorrhoids: Secondary | ICD-10-CM | POA: Diagnosis not present

## 2018-04-24 DIAGNOSIS — K3189 Other diseases of stomach and duodenum: Secondary | ICD-10-CM | POA: Diagnosis not present

## 2018-04-24 DIAGNOSIS — Z79899 Other long term (current) drug therapy: Secondary | ICD-10-CM | POA: Diagnosis not present

## 2018-04-24 HISTORY — DX: Other giant cell arteritis: M31.6

## 2018-04-24 HISTORY — PX: COLONOSCOPY WITH PROPOFOL: SHX5780

## 2018-04-24 HISTORY — DX: Hyperlipidemia, unspecified: E78.5

## 2018-04-24 HISTORY — PX: ESOPHAGOGASTRODUODENOSCOPY (EGD) WITH PROPOFOL: SHX5813

## 2018-04-24 HISTORY — DX: Cellulitis, unspecified: L03.90

## 2018-04-24 HISTORY — DX: Rosacea, unspecified: L71.9

## 2018-04-24 LAB — GLUCOSE, CAPILLARY: GLUCOSE-CAPILLARY: 63 mg/dL — AB (ref 70–99)

## 2018-04-24 SURGERY — COLONOSCOPY WITH PROPOFOL
Anesthesia: General

## 2018-04-24 MED ORDER — LIDOCAINE HCL (PF) 2 % IJ SOLN
INTRAMUSCULAR | Status: AC
  Filled 2018-04-24: qty 40

## 2018-04-24 MED ORDER — GLYCOPYRROLATE 0.2 MG/ML IJ SOLN
INTRAMUSCULAR | Status: AC
  Filled 2018-04-24: qty 1

## 2018-04-24 MED ORDER — ONDANSETRON HCL 4 MG/2ML IJ SOLN
INTRAMUSCULAR | Status: AC
  Filled 2018-04-24: qty 2

## 2018-04-24 MED ORDER — FENTANYL CITRATE (PF) 100 MCG/2ML IJ SOLN
INTRAMUSCULAR | Status: AC
  Filled 2018-04-24: qty 2

## 2018-04-24 MED ORDER — GLYCOPYRROLATE 0.2 MG/ML IJ SOLN
INTRAMUSCULAR | Status: AC
  Filled 2018-04-24: qty 2

## 2018-04-24 MED ORDER — LIDOCAINE HCL (PF) 1 % IJ SOLN
INTRAMUSCULAR | Status: AC
  Administered 2018-04-24: 0.3 mL via INTRADERMAL
  Filled 2018-04-24: qty 2

## 2018-04-24 MED ORDER — SODIUM CHLORIDE 0.9 % IJ SOLN
INTRAMUSCULAR | Status: AC
  Filled 2018-04-24: qty 10

## 2018-04-24 MED ORDER — PROPOFOL 500 MG/50ML IV EMUL
INTRAVENOUS | Status: DC | PRN
  Administered 2018-04-24: 140 ug/kg/min via INTRAVENOUS

## 2018-04-24 MED ORDER — LIDOCAINE HCL (CARDIAC) PF 100 MG/5ML IV SOSY
PREFILLED_SYRINGE | INTRAVENOUS | Status: DC | PRN
  Administered 2018-04-24: 40 mg via INTRAVENOUS

## 2018-04-24 MED ORDER — MORPHINE SULFATE (PF) 0.5 MG/ML IJ SOLN
INTRAMUSCULAR | Status: AC
  Filled 2018-04-24: qty 10

## 2018-04-24 MED ORDER — LIDOCAINE HCL (PF) 1 % IJ SOLN
2.0000 mL | Freq: Once | INTRAMUSCULAR | Status: AC
Start: 2018-04-24 — End: 2018-04-24
  Administered 2018-04-24: 0.3 mL via INTRADERMAL

## 2018-04-24 MED ORDER — EPHEDRINE SULFATE 50 MG/ML IJ SOLN
INTRAMUSCULAR | Status: AC
  Filled 2018-04-24: qty 1

## 2018-04-24 MED ORDER — SODIUM CHLORIDE 0.9 % IV SOLN
INTRAVENOUS | Status: DC
  Administered 2018-04-24: 1000 mL via INTRAVENOUS

## 2018-04-24 MED ORDER — PROPOFOL 10 MG/ML IV BOLUS
INTRAVENOUS | Status: AC
Start: 2018-04-24 — End: ?
  Filled 2018-04-24: qty 20

## 2018-04-24 MED ORDER — LIDOCAINE HCL (PF) 2 % IJ SOLN
INTRAMUSCULAR | Status: AC
  Filled 2018-04-24: qty 10

## 2018-04-24 MED ORDER — PROPOFOL 10 MG/ML IV BOLUS
INTRAVENOUS | Status: DC | PRN
  Administered 2018-04-24: 60 mg via INTRAVENOUS

## 2018-04-24 NOTE — Transfer of Care (Signed)
Immediate Anesthesia Transfer of Care Note  Patient: Wanda Monroe  Procedure(s) Performed: COLONOSCOPY WITH PROPOFOL (N/A ) ESOPHAGOGASTRODUODENOSCOPY (EGD) WITH PROPOFOL (N/A )  Patient Location: PACU  Anesthesia Type:General  Level of Consciousness: sedated  Airway & Oxygen Therapy: Patient Spontanous Breathing and Patient connected to nasal cannula oxygen  Post-op Assessment: Report given to RN and Post -op Vital signs reviewed and stable  Post vital signs: Reviewed and stable  Last Vitals:  Vitals Value Taken Time  BP 97/43 04/24/2018 10:43 AM  Temp 36.4 C 04/24/2018 10:43 AM  Pulse 66 04/24/2018 10:43 AM  Resp 26 04/24/2018 10:43 AM  SpO2 93 % 04/24/2018 10:43 AM  Vitals shown include unvalidated device data.  Last Pain:  Vitals:   04/24/18 1040  TempSrc:   PainSc: 0-No pain         Complications: No apparent anesthesia complications

## 2018-04-24 NOTE — Op Note (Signed)
[facility] Gastroenterology Patient Name: Wanda Monroe Procedure Date: 04/24/2018 9:52 AM MRN: [id number] Account #: [id number] Date of Birth: [birth year] Admit Type: Outpatient Age: 77 Room: [facility] ENDO ROOM 2 Gender: Female Note Status: Finalized Procedure:            Upper GI endoscopy Indications:          Upper abdominal symptoms associated with weight loss                        (suggesting structural disease), Esophageal reflux,                        Weight loss Providers:            Benay Pike. Dariya Gainer MD, MD Medicines:            Propofol per Anesthesia Complications:        No immediate complications. Procedure:            Pre-Anesthesia Assessment:                       - The risks and benefits of the procedure and the                        sedation options and risks were discussed with the                        patient. All questions were answered and informed                        consent was obtained.                       - Patient identification and proposed procedure were                        verified prior to the procedure by the nurse. The                        procedure was verified in the procedure room.                       - ASA Grade Assessment: II - A patient with mild                        systemic disease.                       - After reviewing the risks and benefits, the patient                        was deemed in satisfactory condition to undergo the                        procedure.                       After obtaining informed consent, the endoscope was                        passed under direct vision. Throughout the procedure,  the patient's blood pressure, pulse, and oxygen                        saturations were monitored continuously. The Endoscope                        was introduced through the mouth, and advanced to the                        third part of duodenum. The upper GI endoscopy was                         accomplished without difficulty. The patient tolerated                        the procedure well. Findings:      The examined esophagus was normal.      A 3 cm hiatal hernia was present.      Patchy mildly erythematous mucosa without bleeding was found in the       gastric antrum. Biopsies were taken with a cold forceps for Helicobacter       pylori testing.      The examined duodenum was normal. Biopsies for histology were taken with       a cold forceps for evaluation of celiac disease.      The exam was otherwise without abnormality. Impression:           - Normal esophagus.                       - 3 cm hiatal hernia.                       - Erythematous mucosa in the antrum. Biopsied.                       - Normal examined duodenum. Biopsied.                       - The examination was otherwise normal. Recommendation:       - Await pathology results.                       - Proceed with colonoscopy Procedure Code(s):    --- Professional ---                       [phone], Esophagogastroduodenoscopy, flexible, transoral;                        with biopsy, single or multiple Diagnosis Code(s):    --- Professional ---                       R63.4, Abnormal weight loss                       K21.9, Gastro-esophageal reflux disease without                        esophagitis                       K44.9, Diaphragmatic hernia without obstruction or  gangrene                       K31.89, Other diseases of stomach and duodenum                       R19.8, Other specified symptoms and signs involving the                        digestive system and abdomen CPT copyright 2017 American Medical Association. All rights reserved. The codes documented in this report are preliminary and upon coder review may  be revised to meet current compliance requirements. Efrain Sella MD, MD 04/24/2018 10:18:14 AM This report has been signed  electronically. Number of Addenda: 0 Note Initiated On: 04/24/2018 9:52 AM      [facility]

## 2018-04-24 NOTE — Op Note (Signed)
[facility] Gastroenterology Patient Name: Wanda Monroe Procedure Date: 04/24/2018 9:52 AM MRN: [id number] Account #: [id number] Date of Birth: 11/10/1940 Admit Type: Outpatient Age: 77 Room: [facility] ENDO ROOM 2 Gender: Female Note Status: Finalized Procedure:            Colonoscopy Indications:          Functional diarrhea Providers:            Benay Pike. Paislyn Domenico MD, MD Medicines:            Propofol per Anesthesia Complications:        No immediate complications. Procedure:            Pre-Anesthesia Assessment:                       - The risks and benefits of the procedure and the                        sedation options and risks were discussed with the                        patient. All questions were answered and informed                        consent was obtained.                       - Patient identification and proposed procedure were                        verified prior to the procedure by the nurse. The                        procedure was verified in the procedure room.                       - ASA Grade Assessment: II - A patient with mild                        systemic disease.                       - After reviewing the risks and benefits, the patient                        was deemed in satisfactory condition to undergo the                        procedure.                       After obtaining informed consent, the colonoscope was                        passed under direct vision. Throughout the procedure,                        the patient's blood pressure, pulse, and oxygen                        saturations were monitored continuously. The  Colonoscope was introduced through the anus and                        advanced to the the cecum, identified by appendiceal                        orifice and ileocecal valve. The colonoscopy was                        performed without difficulty. Findings:      The perianal and  digital rectal examinations were normal. Pertinent       negatives include normal sphincter tone and no palpable rectal lesions.      Many small-mouthed diverticula were found in the sigmoid colon.      Normal mucosa was found in the entire colon. Biopsies for histology were       taken with a cold forceps from the random colon for evaluation of       microscopic colitis.      The exam was otherwise without abnormality.      Non-bleeding internal hemorrhoids were found during retroflexion. The       hemorrhoids were Grade I (internal hemorrhoids that do not prolapse). Impression:           - Diverticulosis in the sigmoid colon.                       - Normal mucosa in the entire examined colon. Biopsied.                       - The examination was otherwise normal. Recommendation:       - Await pathology results from EGD, also performed                        today.                       - Patient has a contact number available for                        emergencies. The signs and symptoms of potential                        delayed complications were discussed with the patient.                        Return to normal activities tomorrow. Written discharge                        instructions were provided to the patient.                       - Resume previous diet.                       - Continue present medications.                       - No repeat colonoscopy due to age.                       - Return to physician assistant in 3 months.                       -  The findings and recommendations were discussed with                        the patient and their family. Procedure Code(s):    --- Professional ---                       [phone], Colonoscopy, flexible; with biopsy, single or                        multiple Diagnosis Code(s):    --- Professional ---                       K57.30, Diverticulosis of large intestine without                        perforation or abscess without  bleeding                       K59.1, Functional diarrhea CPT copyright 2017 American Medical Association. All rights reserved. The codes documented in this report are preliminary and upon coder review may  be revised to meet current compliance requirements. Efrain Sella MD, MD 04/24/2018 10:45:59 AM This report has been signed electronically. Number of Addenda: 0 Note Initiated On: 04/24/2018 9:52 AM Scope Withdrawal Time: 0 hours 10 minutes 25 seconds  Total Procedure Duration: 0 hours 15 minutes 56 seconds       [facility]

## 2018-04-24 NOTE — Anesthesia Preprocedure Evaluation (Signed)
Anesthesia Evaluation  Patient identified by MRN, date of birth, ID band Patient awake    Reviewed: Allergy & Precautions, H&P , NPO status , Patient's Chart, lab work & pertinent test results, reviewed documented beta blocker date and time   History of Anesthesia Complications Negative for: history of anesthetic complications  Airway Mallampati: II  TM Distance: >3 FB Neck ROM: full    Dental  (+) Dental Advidsory Given, Edentulous Upper, Partial Lower, Upper Dentures, Missing   Pulmonary neg pulmonary ROS,           Cardiovascular Exercise Tolerance: Good hypertension, (-) angina(-) CAD, (-) Past MI, (-) Cardiac Stents and (-) CABG negative cardio ROS  (-) dysrhythmias (-) Valvular Problems/Murmurs     Neuro/Psych neg Seizures  Neuromuscular disease CVA, Residual Symptoms negative psych ROS   GI/Hepatic Neg liver ROS, GERD  ,  Endo/Other  diabetes  Renal/GU negative Renal ROS  negative genitourinary   Musculoskeletal   Abdominal   Peds  Hematology  (+) anemia ,   Anesthesia Other Findings Past Medical History: No date: Anemia No date: Arthritis     Comment:  rhuematoid,OSTEOARTHRITIS No date: Cellulitis No date: 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 No date: GERD (gastroesophageal reflux disease) No date: Gout No date: Hyperlipidemia No date: Hypertension No date: 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 No date: Osteoporosis No date: Rosacea No date: Spinal stenosis of lumbar region No date: Stroke [facility])     Comment:  x2, 9/10 No date: Temporal arteritis (HCC) No date: Wears dentures     Comment:  full top, partial bottom   Reproductive/Obstetrics negative OB ROS                             Anesthesia Physical Anesthesia Plan  ASA: III  Anesthesia Plan: General   Post-op Pain Management:    Induction:  Intravenous  PONV Risk Score and Plan: 3 and Propofol infusion and TIVA  Airway Management Planned: Nasal Cannula  Additional Equipment:   Intra-op Plan:   Post-operative Plan:   Informed Consent: I have reviewed the patients History and Physical, chart, labs and discussed the procedure including the risks, benefits and alternatives for the proposed anesthesia with the patient or authorized representative who has indicated his/her understanding and acceptance.   Dental Advisory Given  Plan Discussed with: Anesthesiologist, CRNA and Surgeon  Anesthesia Plan Comments:         Anesthesia Quick Evaluation

## 2018-04-24 NOTE — H&P (Signed)
Outpatient short stay form Pre-procedure 04/24/2018 9:01 AM Borden Thune K. Alice Reichert, M.D.  Primary Physician: Fulton Reek, M.D.  Reason for visit:  GERD, weight loss, H Pylori Pos Ab, Chronic diarrhea  History of present illness:  Problems as noted above. Patient has 4-5 loose to watery stools daily, GERD seems controlled but weight loss has prompted concern to perform UGI evaluation. 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    No current facility-administered medications for this encounter.   Medications Prior to Admission  Medication Sig Dispense Refill Last Dose  . acyclovir ointment (ZOVIRAX) 5 % Apply 1 application topically every 3 (three) hours.     . predniSONE (DELTASONE) 5 MG tablet Take 5 mg by mouth daily with breakfast.     . acetaminophen (TYLENOL) 500 MG tablet Take 1,000 mg by mouth at bedtime.   Past Week at Unknown time  . alendronate (FOSAMAX) 70 MG tablet Take 70 mg by mouth once a week. Take with a full glass of water on an empty stomach.   Past Week at Unknown time  . amLODipine (NORVASC) 2.5 MG tablet Take 2.5 mg by mouth daily. AM   11/16/2016 at 0515  . aspirin 81 MG tablet Take 81 mg by mouth daily. AM   11/15/2016 at 0800  . atenolol (TENORMIN) 25 MG tablet Take 25 mg by mouth daily. PM   11/15/2016 at 2100  . atorvastatin (LIPITOR) 80 MG tablet Take 80 mg by mouth daily. PM   11/15/2016 at 2100  . Biotin 1 MG CAPS Take 2.5 mg by mouth daily. PM   Past Week at Unknown time  . calcium citrate-vitamin D (CITRACAL+D) 315-200 MG-UNIT per tablet Take 1 tablet by mouth daily. AM   Past Week at Unknown time  . Cholecalciferol (VITAMIN D-3 PO) Take 1,000 mg by mouth daily. AM   Past Week at Unknown time  . Cyanocobalamin (VITAMIN B12 PO) Take 1,000 mcg by mouth daily. AM   Past Week at Unknown time  . folic acid (FOLVITE) 417 MCG tablet Take 400 mcg by mouth daily. AM   Past Week at Unknown time  . gabapentin (NEURONTIN) 100 MG capsule Take 100 mg by mouth 3  (three) times daily.   11/16/2016 at [place]  . HYDROcodone-acetaminophen (NORCO/VICODIN) 5-325 MG per tablet Take 1 tablet by mouth every 6 (six) hours as needed for moderate pain (may take 2).   Not Taking at Unknown time  . latanoprost (XALATAN) 0.005 % ophthalmic solution 1 drop at bedtime.   11/15/2016 at 2100  . metFORMIN (GLUCOPHAGE) 500 MG tablet Take 500 mg by mouth daily. AM   11/15/2016 at 2100  . methotrexate (RHEUMATREX) 2.5 MG tablet Take 2.5 mg by mouth once a week. 4 tabs every  Wednesday   Past Week at Unknown time  . omeprazole (PRILOSEC) 20 MG capsule Take 20 mg by mouth daily. AM   11/16/2016 at 0515  . oxyCODONE-acetaminophen (ROXICET) 5-325 MG per tablet Take 1 tablet by mouth every 4 (four) hours as needed for severe pain. (Patient not taking: Reported on 10/13/2016) 30 tablet 0 Not Taking at Unknown time  . potassium chloride (K-DUR,KLOR-CON) 10 MEQ tablet Take 10 mEq by mouth daily. AM   Past Week at Unknown time  . prednisoLONE 5 MG TABS tablet Take 5 mg by mouth every other day.   Not Taking at Unknown time  . sertraline (ZOLOFT) 50 MG tablet Take 50 mg by mouth daily. PM  11/15/2016 at 2100     Allergies  Allergen Reactions  . Codeine Sulfate Nausea Only  . Remicade [Infliximab] Other (See Comments)    Infection     Past Medical History:  Diagnosis Date  . Anemia   . Arthritis    rhuematoid,OSTEOARTHRITIS  . Cellulitis   . Diabetes mellitus without complication ([place])    type II  . GERD (gastroesophageal reflux disease)   . Gout   . Hyperlipidemia   . Hypertension   . Neuromuscular disorder ([place])    left foot neuropathy  . Osteoporosis   . Rosacea   . Spinal stenosis of lumbar region   . Stroke ([place])    x2, 9/10  . Temporal arteritis ([place])   . Wears dentures    full top, partial bottom    Review of systems:  Otherwise negative.    Physical Exam  Gen: Alert, oriented. Appears stated age.  HEENT: [place]/AT. PERRLA. Lungs: CTA, no wheezes. CV: RR nl S1,  S2. Abd: soft, benign, no masses. BS+ Ext: No edema. Pulses 2+    Planned procedures: Proceed with EGD w/ small bowel biopsy and colonoscopy w/ random bx. The patient understands the nature of the planned procedure, indications, risks, alternatives and potential complications including but not limited to bleeding, infection, perforation, damage to internal organs and possible oversedation/side effects from anesthesia. The patient agrees and gives consent to proceed.  Please refer to procedure notes for findings, recommendations and patient disposition/instructions.     Zahir Eisenhour K. Alice Reichert, M.D. Gastroenterology 04/24/2018  9:01 AM

## 2018-04-24 NOTE — Interval H&P Note (Signed)
History and Physical Interval Note:  04/24/2018 9:04 AM  Wanda Monroe  has presented today for surgery, with the diagnosis of H PYLORI GERD  WT LOSS CHRONIC DIARRHEA  The various methods of treatment have been discussed with the patient and family. After consideration of risks, benefits and other options for treatment, the patient has consented to  Procedure(s): COLONOSCOPY WITH PROPOFOL (N/A) ESOPHAGOGASTRODUODENOSCOPY (EGD) WITH PROPOFOL (N/A) as a surgical intervention .  The patient's history has been reviewed, patient examined, no change in status, stable for surgery.  I have reviewed the patient's chart and labs.  Questions were answered to the patient's satisfaction.     [place], [place]

## 2018-04-24 NOTE — Anesthesia Post-op Follow-up Note (Signed)
Anesthesia QCDR form completed.        

## 2018-04-25 LAB — SURGICAL PATHOLOGY

## 2018-04-25 NOTE — Anesthesia Postprocedure Evaluation (Signed)
Anesthesia Post Note  Patient: Wanda Monroe  Procedure(s) Performed: COLONOSCOPY WITH PROPOFOL (N/A ) ESOPHAGOGASTRODUODENOSCOPY (EGD) WITH PROPOFOL (N/A )  Patient location during evaluation: Endoscopy Anesthesia Type: General Level of consciousness: awake and alert Pain management: pain level controlled Vital Signs Assessment: post-procedure vital signs reviewed and stable Respiratory status: spontaneous breathing, nonlabored ventilation, respiratory function stable and patient connected to nasal cannula oxygen Cardiovascular status: blood pressure returned to baseline and stable Postop Assessment: no apparent nausea or vomiting Anesthetic complications: no     Last Vitals:  Vitals:   04/24/18 1100 04/24/18 1110  BP: (!) 114/42 (!) 126/56  Pulse: (!) 57 (!) 55  Resp: (!) 25 16  Temp:    SpO2: 100% 100%    Last Pain:  Vitals:   04/24/18 1110  TempSrc:   PainSc: 0-No pain                 Martha Clan

## 2018-04-30 ENCOUNTER — Encounter: Payer: Self-pay | Admitting: Internal Medicine

## 2018-04-30 DIAGNOSIS — H401132 Primary open-angle glaucoma, bilateral, moderate stage: Secondary | ICD-10-CM | POA: Diagnosis not present

## 2018-05-07 DIAGNOSIS — H401132 Primary open-angle glaucoma, bilateral, moderate stage: Secondary | ICD-10-CM | POA: Diagnosis not present

## 2018-05-08 DIAGNOSIS — K449 Diaphragmatic hernia without obstruction or gangrene: Secondary | ICD-10-CM | POA: Diagnosis not present

## 2018-05-08 DIAGNOSIS — R634 Abnormal weight loss: Secondary | ICD-10-CM | POA: Diagnosis not present

## 2018-05-08 DIAGNOSIS — K219 Gastro-esophageal reflux disease without esophagitis: Secondary | ICD-10-CM | POA: Diagnosis not present

## 2018-05-08 DIAGNOSIS — K3189 Other diseases of stomach and duodenum: Secondary | ICD-10-CM | POA: Diagnosis not present

## 2018-05-08 DIAGNOSIS — K529 Noninfective gastroenteritis and colitis, unspecified: Secondary | ICD-10-CM | POA: Diagnosis not present

## 2018-05-23 DIAGNOSIS — M0579 Rheumatoid arthritis with rheumatoid factor of multiple sites without organ or systems involvement: Secondary | ICD-10-CM | POA: Diagnosis not present

## 2018-05-23 DIAGNOSIS — Z79899 Other long term (current) drug therapy: Secondary | ICD-10-CM | POA: Diagnosis not present

## 2018-05-29 DIAGNOSIS — M316 Other giant cell arteritis: Secondary | ICD-10-CM | POA: Diagnosis not present

## 2018-05-29 DIAGNOSIS — M0579 Rheumatoid arthritis with rheumatoid factor of multiple sites without organ or systems involvement: Secondary | ICD-10-CM | POA: Diagnosis not present

## 2018-05-29 DIAGNOSIS — M25531 Pain in right wrist: Secondary | ICD-10-CM | POA: Diagnosis not present

## 2018-06-28 ENCOUNTER — Other Ambulatory Visit
Admission: RE | Admit: 2018-06-28 | Discharge: 2018-06-28 | Disposition: A | Discharge status: Home / Self Care | Payer: PPO | Source: Ambulatory Visit | Attending: Student | Admitting: Student

## 2018-06-28 DIAGNOSIS — K529 Noninfective gastroenteritis and colitis, unspecified: Secondary | ICD-10-CM | POA: Insufficient documentation

## 2018-06-28 LAB — GASTROINTESTINAL PANEL BY PCR, STOOL (REPLACES STOOL CULTURE)

## 2018-06-28 LAB — C DIFFICILE QUICK SCREEN W PCR REFLEX
C Diff antigen: NEGATIVE
C Diff interpretation: NOT DETECTED
C Diff toxin: NEGATIVE

## 2018-08-21 DIAGNOSIS — E119 Type 2 diabetes mellitus without complications: Secondary | ICD-10-CM | POA: Diagnosis not present

## 2018-08-21 DIAGNOSIS — Z79899 Other long term (current) drug therapy: Secondary | ICD-10-CM | POA: Diagnosis not present

## 2018-08-21 DIAGNOSIS — E7800 Pure hypercholesterolemia, unspecified: Secondary | ICD-10-CM | POA: Diagnosis not present

## 2018-08-21 DIAGNOSIS — M0579 Rheumatoid arthritis with rheumatoid factor of multiple sites without organ or systems involvement: Secondary | ICD-10-CM | POA: Diagnosis not present

## 2018-08-28 DIAGNOSIS — M316 Other giant cell arteritis: Secondary | ICD-10-CM | POA: Diagnosis not present

## 2018-08-28 DIAGNOSIS — M0579 Rheumatoid arthritis with rheumatoid factor of multiple sites without organ or systems involvement: Secondary | ICD-10-CM | POA: Diagnosis not present

## 2018-08-28 DIAGNOSIS — G8929 Other chronic pain: Secondary | ICD-10-CM | POA: Diagnosis not present

## 2018-08-28 DIAGNOSIS — M25561 Pain in right knee: Secondary | ICD-10-CM | POA: Diagnosis not present

## 2018-08-28 DIAGNOSIS — Z79899 Other long term (current) drug therapy: Secondary | ICD-10-CM | POA: Diagnosis not present

## 2018-08-28 DIAGNOSIS — M810 Age-related osteoporosis without current pathological fracture: Secondary | ICD-10-CM | POA: Diagnosis not present

## 2018-08-29 DIAGNOSIS — E7800 Pure hypercholesterolemia, unspecified: Secondary | ICD-10-CM | POA: Diagnosis not present

## 2018-08-29 DIAGNOSIS — I10 Essential (primary) hypertension: Secondary | ICD-10-CM | POA: Diagnosis not present

## 2018-08-29 DIAGNOSIS — M0579 Rheumatoid arthritis with rheumatoid factor of multiple sites without organ or systems involvement: Secondary | ICD-10-CM | POA: Diagnosis not present

## 2018-08-29 DIAGNOSIS — E1165 Type 2 diabetes mellitus with hyperglycemia: Secondary | ICD-10-CM | POA: Diagnosis not present

## 2018-09-22 IMAGING — CT CT ABD-PELV W/ CM
2 of 5 series · 14 of 46 positions shown, 16 images · IV contrast (APPLIED)
Comparison: None.

CLINICAL DATA: Diarrhea for 1 month, weight loss about 15 lbs, loss
of appetite, nausea, no vomiting

EXAM:
CT ABDOMEN AND PELVIS WITH CONTRAST
TECHNIQUE: Multidetector CT imaging of the abdomen and pelvis was performed
using the standard protocol following bolus administration of
intravenous contrast.
CONTRAST:  100mL X5O5H0-7YY IOPAMIDOL (X5O5H0-7YY) INJECTION 61%

[Series 2: routine abd/pel with · axial · 0.68mm/px · z∈[-979,-569]mm · 11 of 92 slices shown, 13 images]
[im 5/92  soft-tissue]
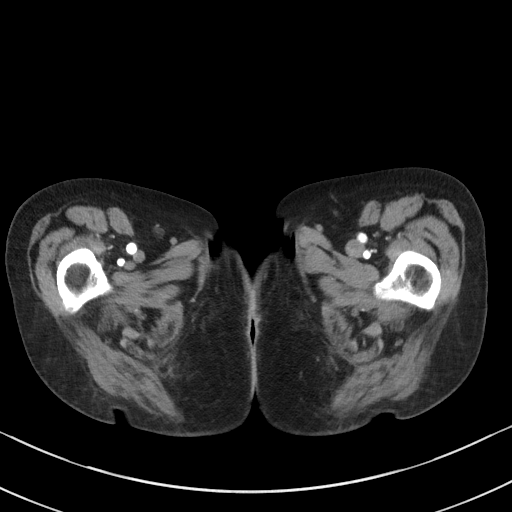
[im 5/92  bone]
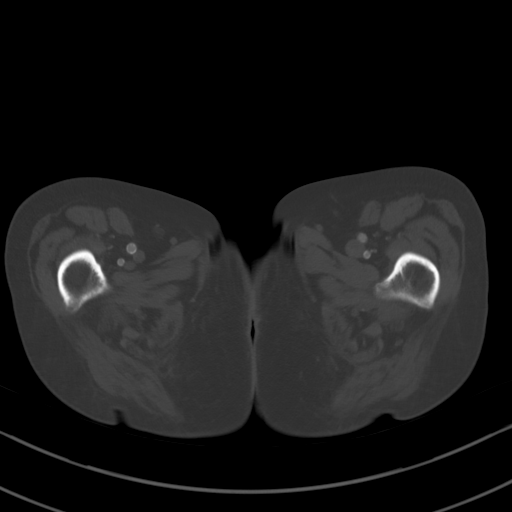
[im 14/92  soft-tissue]
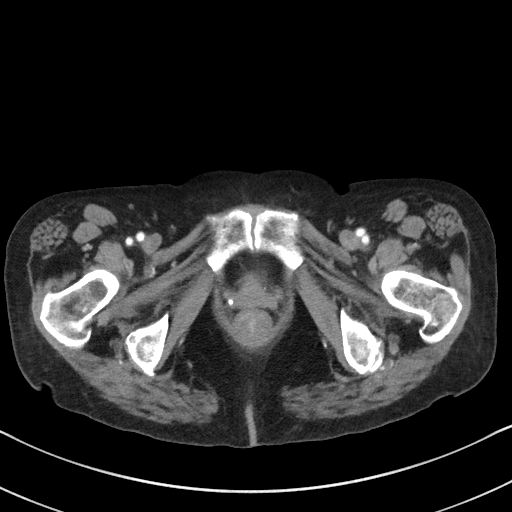
[im 23/92  soft-tissue]
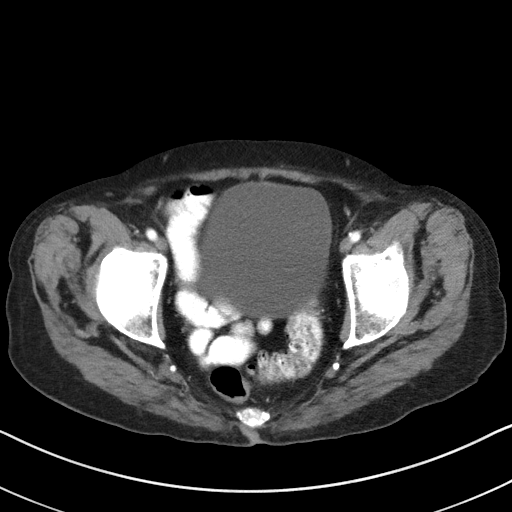
[im 32/92  soft-tissue]
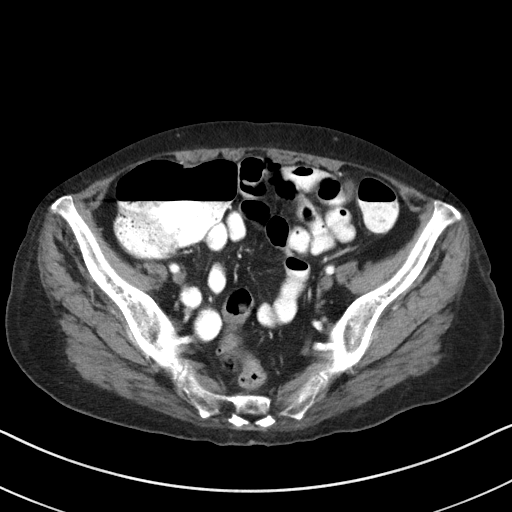
[im 37/92  soft-tissue]
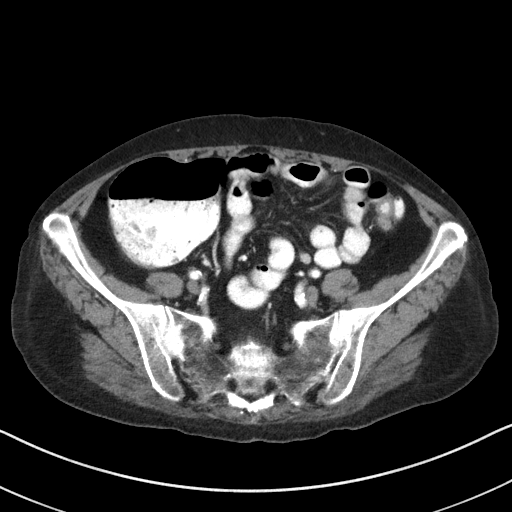
[im 46/92  soft-tissue]
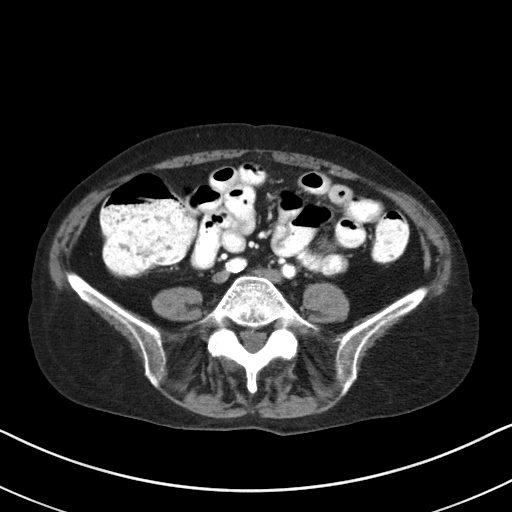
[im 55/92  soft-tissue]
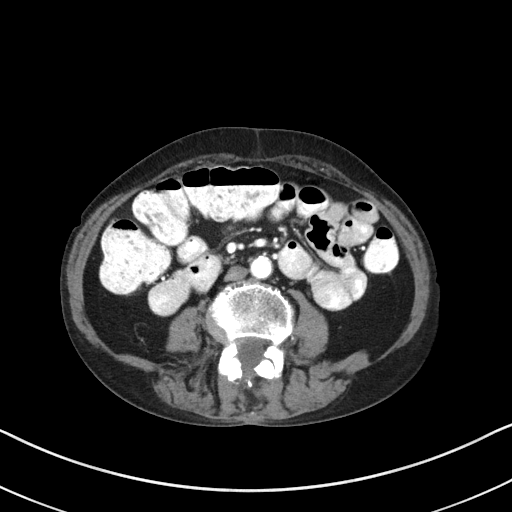
[im 60/92  soft-tissue]
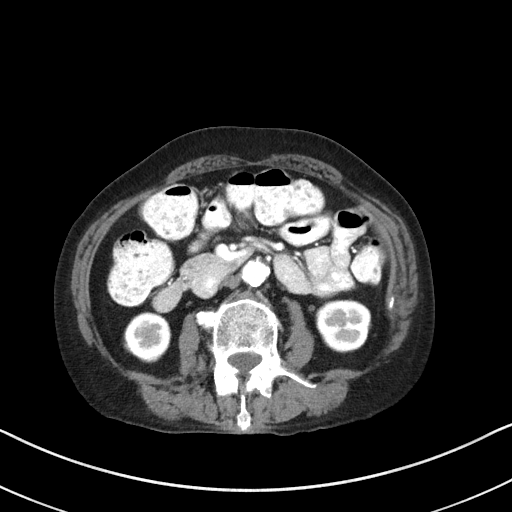
[im 69/92  soft-tissue]
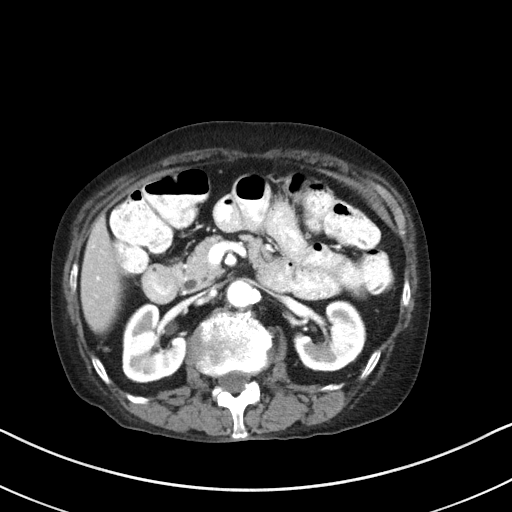
[im 69/92  bone]
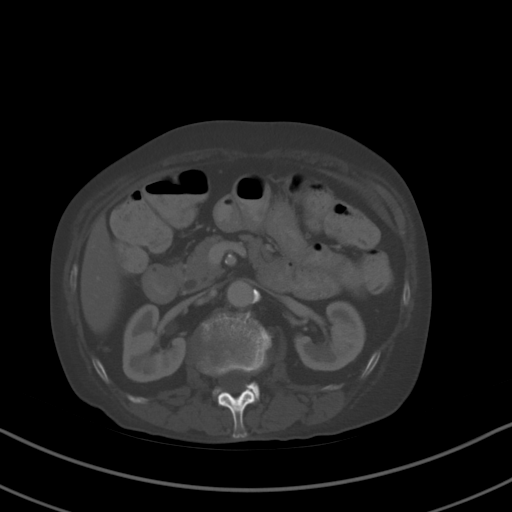
[im 78/92  soft-tissue]
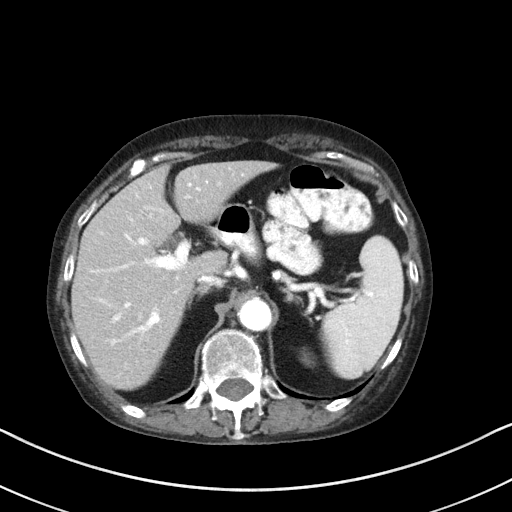
[im 87/92  soft-tissue]
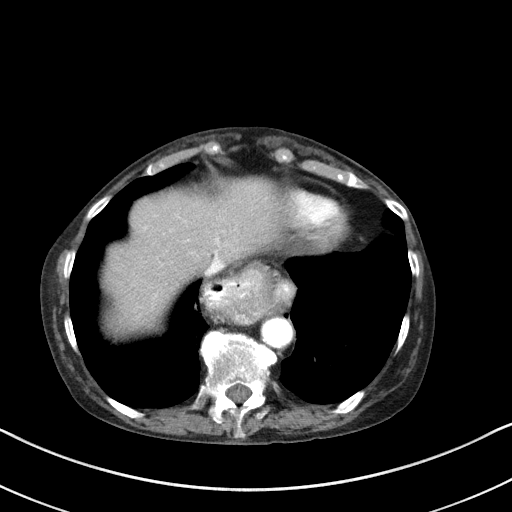

[Series 5: coronal st · coronal · 0.65mm/px · 3 of 78 slices shown]
[im 26/78  soft-tissue]
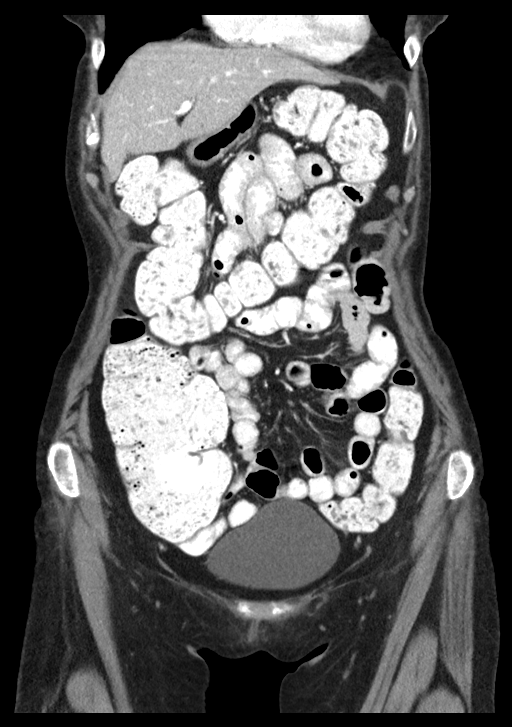
[im 35/78  soft-tissue]
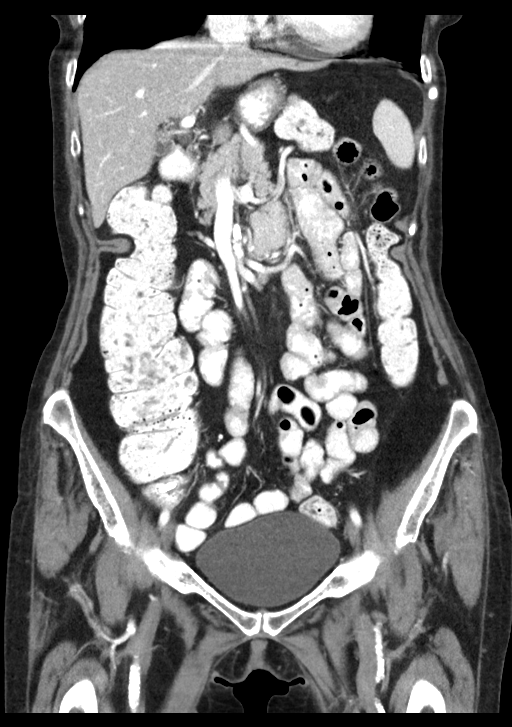
[im 43/78  soft-tissue]
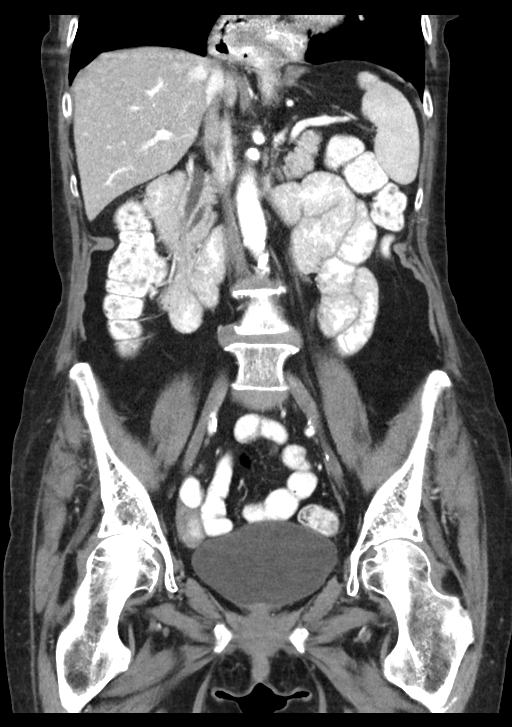

[14 of 46 positions shown; findings below may reference images not displayed]

FINDINGS: Lower chest: Lung bases shows no acute findings. Moderate size
hiatal hernia measures 6.8 cm.

Hepatobiliary: Mild fatty infiltration of the liver. No focal
hepatic mass. No calcified gallstones are noted within gallbladder.

Pancreas: Unremarkable. No pancreatic ductal dilatation or
surrounding inflammatory changes.

Spleen: Normal in size without focal abnormality.

Adrenals/Urinary Tract: No adrenal gland mass. Enhanced kidneys are
symmetrical in size. No hydronephrosis or hydroureter. Delayed renal
images shows bilateral renal symmetrical excretion. Bilateral
visualized proximal ureter is unremarkable. The urinary bladder with
normal appearance. No thickening of urinary bladder wall. No bladder
filling defects.

Stomach/Bowel: There is no small bowel obstruction. No gastric
outlet obstruction. No pericecal inflammation. The patient is status
post appendectomy. There is moderate distension of the cecum up to 8
cm. There is leak with stool contrast material and gas within cecum.
Moderate liquid stool contrast and some colonic gas noted within
right colon and transverse colon. Some liquid stool and contrast
noted within descending colon and rectosigmoid colon. Nonspecific
diarrhea cannot be excluded. Some colonic gas and stool noted within
rectum. No evidence of thickening of colonic wall or pericolonic
inflammation. No evidence of acute colitis or acute diverticulitis.
Few diverticula are noted distal sigmoid colon.

Vascular/Lymphatic: Atherosclerotic calcifications of abdominal
aorta and iliac arteries are noted. Atherosclerotic calcifications
are noted bilateral renal artery origin. Atherosclerotic
calcifications of SMA. No aortic aneurysm. No retroperitoneal or
mesenteric adenopathy.

Reproductive: The patient is status post hysterectomy. No pelvic
mass or adenopathy.

Other: No ascites or free abdominal air.

Musculoskeletal: No destructive bony lesions are noted. Sagittal
images of the spine shows multilevel degenerative changes lumbar
spine. There is disc space flattening with vacuum disc phenomenon
and mild anterior spurring at L1-L2 level. Disc space flattening
with vacuum disc phenomenon mild anterior spurring at L2-L3 level.
Mild disc space flattening with anterior spurring at L3-L4 and L4-L5
level. Mild degenerative changes lower thoracic spine.
IMPRESSION: 1. There is moderate size hiatal hernia measures 6.8 cm.
2. Mild fatty infiltration of the liver.
3. No small bowel obstruction.  No hydronephrosis or hydroureter.
4. There is moderate distension of the cecum with some liquid stool
gas and oral contrast material. Cecum measures 8 cm in diameter.
Moderate liquid stool and some gas noted in right colon transverse
colon. Some liquid stool and gas noted within descending colon.
Nonspecific diarrhea cannot be excluded. No evidence of acute
colitis or diverticulitis. No evidence of thickening of colonic
wall. The patient is status post appendectomy. Normal terminal
ileum. No definite colonic obstruction.
5. Status post hysterectomy.  No pelvic mass or adenopathy.
6. Degenerative changes thoracolumbar spine as described above.

## 2018-11-05 DIAGNOSIS — H401132 Primary open-angle glaucoma, bilateral, moderate stage: Secondary | ICD-10-CM | POA: Diagnosis not present

## 2018-11-20 DIAGNOSIS — M0579 Rheumatoid arthritis with rheumatoid factor of multiple sites without organ or systems involvement: Secondary | ICD-10-CM | POA: Diagnosis not present

## 2018-11-20 DIAGNOSIS — Z79899 Other long term (current) drug therapy: Secondary | ICD-10-CM | POA: Diagnosis not present

## 2018-11-27 DIAGNOSIS — G8929 Other chronic pain: Secondary | ICD-10-CM | POA: Diagnosis not present

## 2018-11-27 DIAGNOSIS — I7300 Raynaud's syndrome without gangrene: Secondary | ICD-10-CM | POA: Diagnosis not present

## 2018-11-27 DIAGNOSIS — M7061 Trochanteric bursitis, right hip: Secondary | ICD-10-CM | POA: Diagnosis not present

## 2018-11-27 DIAGNOSIS — Z79899 Other long term (current) drug therapy: Secondary | ICD-10-CM | POA: Diagnosis not present

## 2018-11-27 DIAGNOSIS — M0579 Rheumatoid arthritis with rheumatoid factor of multiple sites without organ or systems involvement: Secondary | ICD-10-CM | POA: Diagnosis not present

## 2018-11-27 DIAGNOSIS — M25561 Pain in right knee: Secondary | ICD-10-CM | POA: Diagnosis not present

## 2018-11-27 DIAGNOSIS — M316 Other giant cell arteritis: Secondary | ICD-10-CM | POA: Diagnosis not present

## 2018-11-27 DIAGNOSIS — M810 Age-related osteoporosis without current pathological fracture: Secondary | ICD-10-CM | POA: Diagnosis not present

## 2019-01-01 ENCOUNTER — Other Ambulatory Visit: Payer: Self-pay | Admitting: Internal Medicine

## 2019-01-01 DIAGNOSIS — D649 Anemia, unspecified: Secondary | ICD-10-CM | POA: Diagnosis not present

## 2019-01-01 DIAGNOSIS — I10 Essential (primary) hypertension: Secondary | ICD-10-CM | POA: Diagnosis not present

## 2019-01-01 DIAGNOSIS — Z0000 Encounter for general adult medical examination without abnormal findings: Secondary | ICD-10-CM | POA: Diagnosis not present

## 2019-01-01 DIAGNOSIS — M0579 Rheumatoid arthritis with rheumatoid factor of multiple sites without organ or systems involvement: Secondary | ICD-10-CM | POA: Diagnosis not present

## 2019-01-01 DIAGNOSIS — Z79899 Other long term (current) drug therapy: Secondary | ICD-10-CM | POA: Diagnosis not present

## 2019-01-01 DIAGNOSIS — E1165 Type 2 diabetes mellitus with hyperglycemia: Secondary | ICD-10-CM | POA: Diagnosis not present

## 2019-01-01 DIAGNOSIS — K130 Diseases of lips: Secondary | ICD-10-CM | POA: Diagnosis not present

## 2019-01-01 DIAGNOSIS — E7800 Pure hypercholesterolemia, unspecified: Secondary | ICD-10-CM | POA: Diagnosis not present

## 2019-01-01 DIAGNOSIS — Z1231 Encounter for screening mammogram for malignant neoplasm of breast: Secondary | ICD-10-CM

## 2019-01-01 DIAGNOSIS — Z1239 Encounter for other screening for malignant neoplasm of breast: Secondary | ICD-10-CM | POA: Diagnosis not present

## 2019-02-14 ENCOUNTER — Ambulatory Visit
Admission: RE | Admit: 2019-02-14 | Discharge: 2019-02-14 | Disposition: A | Discharge status: Home / Self Care | Payer: PPO | Source: Ambulatory Visit | Attending: Internal Medicine | Admitting: Internal Medicine

## 2019-02-14 ENCOUNTER — Other Ambulatory Visit: Payer: Self-pay

## 2019-02-14 DIAGNOSIS — Z1231 Encounter for screening mammogram for malignant neoplasm of breast: Secondary | ICD-10-CM

## 2019-02-20 DIAGNOSIS — I10 Essential (primary) hypertension: Secondary | ICD-10-CM | POA: Diagnosis not present

## 2019-02-20 DIAGNOSIS — M0579 Rheumatoid arthritis with rheumatoid factor of multiple sites without organ or systems involvement: Secondary | ICD-10-CM | POA: Diagnosis not present

## 2019-02-20 DIAGNOSIS — E1165 Type 2 diabetes mellitus with hyperglycemia: Secondary | ICD-10-CM | POA: Diagnosis not present

## 2019-02-20 DIAGNOSIS — E7800 Pure hypercholesterolemia, unspecified: Secondary | ICD-10-CM | POA: Diagnosis not present

## 2019-02-20 DIAGNOSIS — Z79899 Other long term (current) drug therapy: Secondary | ICD-10-CM | POA: Diagnosis not present

## 2019-02-26 DIAGNOSIS — Z79899 Other long term (current) drug therapy: Secondary | ICD-10-CM | POA: Diagnosis not present

## 2019-02-26 DIAGNOSIS — G8929 Other chronic pain: Secondary | ICD-10-CM | POA: Diagnosis not present

## 2019-02-26 DIAGNOSIS — M0579 Rheumatoid arthritis with rheumatoid factor of multiple sites without organ or systems involvement: Secondary | ICD-10-CM | POA: Diagnosis not present

## 2019-02-26 DIAGNOSIS — M25512 Pain in left shoulder: Secondary | ICD-10-CM | POA: Diagnosis not present

## 2019-02-26 DIAGNOSIS — M7061 Trochanteric bursitis, right hip: Secondary | ICD-10-CM | POA: Diagnosis not present

## 2019-02-26 DIAGNOSIS — M316 Other giant cell arteritis: Secondary | ICD-10-CM | POA: Diagnosis not present

## 2019-02-26 DIAGNOSIS — M25561 Pain in right knee: Secondary | ICD-10-CM | POA: Diagnosis not present

## 2019-04-08 DIAGNOSIS — B079 Viral wart, unspecified: Secondary | ICD-10-CM | POA: Diagnosis not present

## 2019-04-08 DIAGNOSIS — D3701 Neoplasm of uncertain behavior of lip: Secondary | ICD-10-CM | POA: Diagnosis not present

## 2019-05-06 DIAGNOSIS — H401132 Primary open-angle glaucoma, bilateral, moderate stage: Secondary | ICD-10-CM | POA: Diagnosis not present

## 2019-05-13 DIAGNOSIS — H401132 Primary open-angle glaucoma, bilateral, moderate stage: Secondary | ICD-10-CM | POA: Diagnosis not present

## 2019-05-21 DIAGNOSIS — I10 Essential (primary) hypertension: Secondary | ICD-10-CM | POA: Diagnosis not present

## 2019-05-21 DIAGNOSIS — G629 Polyneuropathy, unspecified: Secondary | ICD-10-CM | POA: Diagnosis not present

## 2019-05-21 DIAGNOSIS — Z0000 Encounter for general adult medical examination without abnormal findings: Secondary | ICD-10-CM | POA: Diagnosis not present

## 2019-05-21 DIAGNOSIS — F3341 Major depressive disorder, recurrent, in partial remission: Secondary | ICD-10-CM | POA: Diagnosis not present

## 2019-05-21 DIAGNOSIS — E7800 Pure hypercholesterolemia, unspecified: Secondary | ICD-10-CM | POA: Diagnosis not present

## 2019-05-21 DIAGNOSIS — R195 Other fecal abnormalities: Secondary | ICD-10-CM | POA: Diagnosis not present

## 2019-05-21 DIAGNOSIS — E1165 Type 2 diabetes mellitus with hyperglycemia: Secondary | ICD-10-CM | POA: Diagnosis not present

## 2019-05-21 DIAGNOSIS — R634 Abnormal weight loss: Secondary | ICD-10-CM | POA: Diagnosis not present

## 2019-05-21 DIAGNOSIS — E876 Hypokalemia: Secondary | ICD-10-CM | POA: Diagnosis not present

## 2019-05-21 DIAGNOSIS — M0579 Rheumatoid arthritis with rheumatoid factor of multiple sites without organ or systems involvement: Secondary | ICD-10-CM | POA: Diagnosis not present

## 2019-05-21 DIAGNOSIS — M5416 Radiculopathy, lumbar region: Secondary | ICD-10-CM | POA: Diagnosis not present

## 2019-05-21 DIAGNOSIS — Z79899 Other long term (current) drug therapy: Secondary | ICD-10-CM | POA: Diagnosis not present

## 2019-05-29 DIAGNOSIS — E1165 Type 2 diabetes mellitus with hyperglycemia: Secondary | ICD-10-CM | POA: Diagnosis not present

## 2019-05-29 DIAGNOSIS — E7800 Pure hypercholesterolemia, unspecified: Secondary | ICD-10-CM | POA: Diagnosis not present

## 2019-05-29 DIAGNOSIS — I10 Essential (primary) hypertension: Secondary | ICD-10-CM | POA: Diagnosis not present

## 2019-05-29 DIAGNOSIS — Z79899 Other long term (current) drug therapy: Secondary | ICD-10-CM | POA: Diagnosis not present

## 2019-06-11 DIAGNOSIS — M316 Other giant cell arteritis: Secondary | ICD-10-CM | POA: Diagnosis not present

## 2019-06-11 DIAGNOSIS — M0579 Rheumatoid arthritis with rheumatoid factor of multiple sites without organ or systems involvement: Secondary | ICD-10-CM | POA: Diagnosis not present

## 2019-06-11 DIAGNOSIS — M5416 Radiculopathy, lumbar region: Secondary | ICD-10-CM | POA: Diagnosis not present

## 2019-06-11 DIAGNOSIS — G8929 Other chronic pain: Secondary | ICD-10-CM | POA: Diagnosis not present

## 2019-06-11 DIAGNOSIS — Z79899 Other long term (current) drug therapy: Secondary | ICD-10-CM | POA: Diagnosis not present

## 2019-06-11 DIAGNOSIS — M810 Age-related osteoporosis without current pathological fracture: Secondary | ICD-10-CM | POA: Diagnosis not present

## 2019-06-11 DIAGNOSIS — M25561 Pain in right knee: Secondary | ICD-10-CM | POA: Diagnosis not present

## 2019-09-10 DIAGNOSIS — Z79899 Other long term (current) drug therapy: Secondary | ICD-10-CM | POA: Diagnosis not present

## 2019-09-10 DIAGNOSIS — M0579 Rheumatoid arthritis with rheumatoid factor of multiple sites without organ or systems involvement: Secondary | ICD-10-CM | POA: Diagnosis not present

## 2019-11-11 DIAGNOSIS — H401132 Primary open-angle glaucoma, bilateral, moderate stage: Secondary | ICD-10-CM | POA: Diagnosis not present

## 2019-11-11 DIAGNOSIS — E113293 Type 2 diabetes mellitus with mild nonproliferative diabetic retinopathy without macular edema, bilateral: Secondary | ICD-10-CM | POA: Diagnosis not present

## 2019-12-03 DIAGNOSIS — M0579 Rheumatoid arthritis with rheumatoid factor of multiple sites without organ or systems involvement: Secondary | ICD-10-CM | POA: Diagnosis not present

## 2019-12-03 DIAGNOSIS — Z79899 Other long term (current) drug therapy: Secondary | ICD-10-CM | POA: Diagnosis not present

## 2019-12-10 DIAGNOSIS — M0579 Rheumatoid arthritis with rheumatoid factor of multiple sites without organ or systems involvement: Secondary | ICD-10-CM | POA: Diagnosis not present

## 2019-12-10 DIAGNOSIS — I7300 Raynaud's syndrome without gangrene: Secondary | ICD-10-CM | POA: Diagnosis not present

## 2019-12-10 DIAGNOSIS — Z79899 Other long term (current) drug therapy: Secondary | ICD-10-CM | POA: Diagnosis not present

## 2019-12-10 DIAGNOSIS — M316 Other giant cell arteritis: Secondary | ICD-10-CM | POA: Diagnosis not present

## 2019-12-10 DIAGNOSIS — M810 Age-related osteoporosis without current pathological fracture: Secondary | ICD-10-CM | POA: Diagnosis not present

## 2020-01-16 ENCOUNTER — Other Ambulatory Visit: Payer: Self-pay | Admitting: Internal Medicine

## 2020-01-16 DIAGNOSIS — Z1231 Encounter for screening mammogram for malignant neoplasm of breast: Secondary | ICD-10-CM

## 2020-01-31 ENCOUNTER — Other Ambulatory Visit: Payer: Self-pay | Admitting: Internal Medicine

## 2020-01-31 DIAGNOSIS — M0579 Rheumatoid arthritis with rheumatoid factor of multiple sites without organ or systems involvement: Secondary | ICD-10-CM | POA: Diagnosis not present

## 2020-01-31 DIAGNOSIS — R1084 Generalized abdominal pain: Secondary | ICD-10-CM | POA: Diagnosis not present

## 2020-01-31 DIAGNOSIS — Z0000 Encounter for general adult medical examination without abnormal findings: Secondary | ICD-10-CM | POA: Diagnosis not present

## 2020-01-31 DIAGNOSIS — R8290 Unspecified abnormal findings in urine: Secondary | ICD-10-CM | POA: Diagnosis not present

## 2020-01-31 DIAGNOSIS — I10 Essential (primary) hypertension: Secondary | ICD-10-CM | POA: Diagnosis not present

## 2020-01-31 DIAGNOSIS — E7800 Pure hypercholesterolemia, unspecified: Secondary | ICD-10-CM | POA: Diagnosis not present

## 2020-01-31 DIAGNOSIS — E118 Type 2 diabetes mellitus with unspecified complications: Secondary | ICD-10-CM | POA: Diagnosis not present

## 2020-01-31 DIAGNOSIS — Z79899 Other long term (current) drug therapy: Secondary | ICD-10-CM | POA: Diagnosis not present

## 2020-02-11 ENCOUNTER — Ambulatory Visit
Admission: RE | Admit: 2020-02-11 | Discharge: 2020-02-11 | Disposition: A | Discharge status: Home / Self Care | Payer: PPO | Source: Ambulatory Visit | Attending: Internal Medicine | Admitting: Internal Medicine

## 2020-02-11 ENCOUNTER — Other Ambulatory Visit: Payer: Self-pay

## 2020-02-11 DIAGNOSIS — N2889 Other specified disorders of kidney and ureter: Secondary | ICD-10-CM | POA: Diagnosis not present

## 2020-02-11 DIAGNOSIS — D649 Anemia, unspecified: Secondary | ICD-10-CM | POA: Diagnosis not present

## 2020-02-11 DIAGNOSIS — Z8619 Personal history of other infectious and parasitic diseases: Secondary | ICD-10-CM | POA: Diagnosis not present

## 2020-02-11 DIAGNOSIS — R1010 Upper abdominal pain, unspecified: Secondary | ICD-10-CM | POA: Diagnosis not present

## 2020-02-11 DIAGNOSIS — R1084 Generalized abdominal pain: Secondary | ICD-10-CM | POA: Diagnosis not present

## 2020-02-11 DIAGNOSIS — K449 Diaphragmatic hernia without obstruction or gangrene: Secondary | ICD-10-CM | POA: Diagnosis not present

## 2020-02-11 DIAGNOSIS — K3189 Other diseases of stomach and duodenum: Secondary | ICD-10-CM | POA: Diagnosis not present

## 2020-02-11 DIAGNOSIS — K219 Gastro-esophageal reflux disease without esophagitis: Secondary | ICD-10-CM | POA: Diagnosis not present

## 2020-02-11 DIAGNOSIS — R109 Unspecified abdominal pain: Secondary | ICD-10-CM | POA: Diagnosis not present

## 2020-02-11 DIAGNOSIS — R198 Other specified symptoms and signs involving the digestive system and abdomen: Secondary | ICD-10-CM | POA: Diagnosis not present

## 2020-02-12 ENCOUNTER — Other Ambulatory Visit: Payer: Self-pay | Admitting: Internal Medicine

## 2020-02-12 DIAGNOSIS — N2889 Other specified disorders of kidney and ureter: Secondary | ICD-10-CM

## 2020-02-17 ENCOUNTER — Ambulatory Visit
Admission: RE | Admit: 2020-02-17 | Discharge: 2020-02-17 | Disposition: A | Discharge status: Home / Self Care | Payer: PPO | Source: Ambulatory Visit | Attending: Internal Medicine | Admitting: Internal Medicine

## 2020-02-17 ENCOUNTER — Other Ambulatory Visit: Payer: Self-pay

## 2020-02-17 DIAGNOSIS — Z1231 Encounter for screening mammogram for malignant neoplasm of breast: Secondary | ICD-10-CM | POA: Insufficient documentation

## 2020-02-26 ENCOUNTER — Ambulatory Visit
Admission: RE | Admit: 2020-02-26 | Discharge: 2020-02-26 | Disposition: A | Discharge status: Home / Self Care | Payer: PPO | Source: Ambulatory Visit | Attending: Internal Medicine | Admitting: Internal Medicine

## 2020-02-26 ENCOUNTER — Other Ambulatory Visit: Payer: Self-pay

## 2020-02-26 DIAGNOSIS — N2889 Other specified disorders of kidney and ureter: Secondary | ICD-10-CM | POA: Diagnosis not present

## 2020-02-26 DIAGNOSIS — N281 Cyst of kidney, acquired: Secondary | ICD-10-CM | POA: Diagnosis not present

## 2020-02-26 MED ORDER — GADOBUTROL 1 MMOL/ML IV SOLN
5.0000 mL | Freq: Once | INTRAVENOUS | Status: AC | PRN
  Administered 2020-02-26: 5 mL via INTRAVENOUS

## 2020-03-24 DIAGNOSIS — H9201 Otalgia, right ear: Secondary | ICD-10-CM | POA: Diagnosis not present

## 2020-03-24 DIAGNOSIS — H9211 Otorrhea, right ear: Secondary | ICD-10-CM | POA: Diagnosis not present

## 2020-04-07 DIAGNOSIS — H9211 Otorrhea, right ear: Secondary | ICD-10-CM | POA: Diagnosis not present

## 2020-04-07 DIAGNOSIS — H6121 Impacted cerumen, right ear: Secondary | ICD-10-CM | POA: Diagnosis not present

## 2020-05-13 DIAGNOSIS — H401132 Primary open-angle glaucoma, bilateral, moderate stage: Secondary | ICD-10-CM | POA: Diagnosis not present

## 2020-05-22 DIAGNOSIS — H401132 Primary open-angle glaucoma, bilateral, moderate stage: Secondary | ICD-10-CM | POA: Diagnosis not present

## 2020-05-26 DIAGNOSIS — M0579 Rheumatoid arthritis with rheumatoid factor of multiple sites without organ or systems involvement: Secondary | ICD-10-CM | POA: Diagnosis not present

## 2020-05-26 DIAGNOSIS — Z0000 Encounter for general adult medical examination without abnormal findings: Secondary | ICD-10-CM | POA: Diagnosis not present

## 2020-05-26 DIAGNOSIS — I10 Essential (primary) hypertension: Secondary | ICD-10-CM | POA: Diagnosis not present

## 2020-05-26 DIAGNOSIS — E118 Type 2 diabetes mellitus with unspecified complications: Secondary | ICD-10-CM | POA: Diagnosis not present

## 2020-05-26 DIAGNOSIS — Z79899 Other long term (current) drug therapy: Secondary | ICD-10-CM | POA: Diagnosis not present

## 2020-05-26 DIAGNOSIS — E7800 Pure hypercholesterolemia, unspecified: Secondary | ICD-10-CM | POA: Diagnosis not present

## 2020-06-16 DIAGNOSIS — I10 Essential (primary) hypertension: Secondary | ICD-10-CM | POA: Diagnosis not present

## 2020-06-16 DIAGNOSIS — Z79899 Other long term (current) drug therapy: Secondary | ICD-10-CM | POA: Diagnosis not present

## 2020-06-16 DIAGNOSIS — M316 Other giant cell arteritis: Secondary | ICD-10-CM | POA: Diagnosis not present

## 2020-06-16 DIAGNOSIS — G8929 Other chronic pain: Secondary | ICD-10-CM | POA: Diagnosis not present

## 2020-06-16 DIAGNOSIS — M0579 Rheumatoid arthritis with rheumatoid factor of multiple sites without organ or systems involvement: Secondary | ICD-10-CM | POA: Diagnosis not present

## 2020-06-16 DIAGNOSIS — M810 Age-related osteoporosis without current pathological fracture: Secondary | ICD-10-CM | POA: Diagnosis not present

## 2020-06-16 DIAGNOSIS — E118 Type 2 diabetes mellitus with unspecified complications: Secondary | ICD-10-CM | POA: Diagnosis not present

## 2020-06-16 DIAGNOSIS — E7800 Pure hypercholesterolemia, unspecified: Secondary | ICD-10-CM | POA: Diagnosis not present

## 2020-06-16 DIAGNOSIS — M545 Low back pain, unspecified: Secondary | ICD-10-CM | POA: Diagnosis not present

## 2020-06-30 DIAGNOSIS — H401132 Primary open-angle glaucoma, bilateral, moderate stage: Secondary | ICD-10-CM | POA: Diagnosis not present

## 2020-07-01 DIAGNOSIS — E118 Type 2 diabetes mellitus with unspecified complications: Secondary | ICD-10-CM | POA: Diagnosis not present

## 2020-07-01 DIAGNOSIS — M0579 Rheumatoid arthritis with rheumatoid factor of multiple sites without organ or systems involvement: Secondary | ICD-10-CM | POA: Diagnosis not present

## 2020-07-01 DIAGNOSIS — R600 Localized edema: Secondary | ICD-10-CM | POA: Diagnosis not present

## 2020-09-16 DIAGNOSIS — M0579 Rheumatoid arthritis with rheumatoid factor of multiple sites without organ or systems involvement: Secondary | ICD-10-CM | POA: Diagnosis not present

## 2020-09-16 DIAGNOSIS — Z79899 Other long term (current) drug therapy: Secondary | ICD-10-CM | POA: Diagnosis not present

## 2020-09-23 DIAGNOSIS — Z79899 Other long term (current) drug therapy: Secondary | ICD-10-CM | POA: Diagnosis not present

## 2020-09-23 DIAGNOSIS — E7800 Pure hypercholesterolemia, unspecified: Secondary | ICD-10-CM | POA: Diagnosis not present

## 2020-09-23 DIAGNOSIS — E118 Type 2 diabetes mellitus with unspecified complications: Secondary | ICD-10-CM | POA: Diagnosis not present

## 2020-09-23 DIAGNOSIS — R300 Dysuria: Secondary | ICD-10-CM | POA: Diagnosis not present

## 2020-09-23 DIAGNOSIS — M0579 Rheumatoid arthritis with rheumatoid factor of multiple sites without organ or systems involvement: Secondary | ICD-10-CM | POA: Diagnosis not present

## 2020-09-23 DIAGNOSIS — R8290 Unspecified abnormal findings in urine: Secondary | ICD-10-CM | POA: Diagnosis not present

## 2020-09-23 DIAGNOSIS — I10 Essential (primary) hypertension: Secondary | ICD-10-CM | POA: Diagnosis not present

## 2020-11-27 DIAGNOSIS — H401132 Primary open-angle glaucoma, bilateral, moderate stage: Secondary | ICD-10-CM | POA: Diagnosis not present

## 2020-12-08 DIAGNOSIS — E7800 Pure hypercholesterolemia, unspecified: Secondary | ICD-10-CM | POA: Diagnosis not present

## 2020-12-08 DIAGNOSIS — M0579 Rheumatoid arthritis with rheumatoid factor of multiple sites without organ or systems involvement: Secondary | ICD-10-CM | POA: Diagnosis not present

## 2020-12-08 DIAGNOSIS — I10 Essential (primary) hypertension: Secondary | ICD-10-CM | POA: Diagnosis not present

## 2020-12-08 DIAGNOSIS — Z79899 Other long term (current) drug therapy: Secondary | ICD-10-CM | POA: Diagnosis not present

## 2020-12-08 DIAGNOSIS — E118 Type 2 diabetes mellitus with unspecified complications: Secondary | ICD-10-CM | POA: Diagnosis not present

## 2020-12-11 DIAGNOSIS — H26492 Other secondary cataract, left eye: Secondary | ICD-10-CM | POA: Diagnosis not present

## 2020-12-29 DIAGNOSIS — D649 Anemia, unspecified: Secondary | ICD-10-CM | POA: Diagnosis not present

## 2020-12-29 DIAGNOSIS — M810 Age-related osteoporosis without current pathological fracture: Secondary | ICD-10-CM | POA: Diagnosis not present

## 2020-12-29 DIAGNOSIS — M545 Low back pain, unspecified: Secondary | ICD-10-CM | POA: Diagnosis not present

## 2020-12-29 DIAGNOSIS — Z79899 Other long term (current) drug therapy: Secondary | ICD-10-CM | POA: Diagnosis not present

## 2020-12-29 DIAGNOSIS — M0579 Rheumatoid arthritis with rheumatoid factor of multiple sites without organ or systems involvement: Secondary | ICD-10-CM | POA: Diagnosis not present

## 2020-12-29 DIAGNOSIS — I7300 Raynaud's syndrome without gangrene: Secondary | ICD-10-CM | POA: Diagnosis not present

## 2020-12-29 DIAGNOSIS — M316 Other giant cell arteritis: Secondary | ICD-10-CM | POA: Diagnosis not present

## 2020-12-29 DIAGNOSIS — G8929 Other chronic pain: Secondary | ICD-10-CM | POA: Diagnosis not present

## 2021-01-15 DIAGNOSIS — I10 Essential (primary) hypertension: Secondary | ICD-10-CM | POA: Diagnosis not present

## 2021-01-15 DIAGNOSIS — E118 Type 2 diabetes mellitus with unspecified complications: Secondary | ICD-10-CM | POA: Diagnosis not present

## 2021-01-15 DIAGNOSIS — M0579 Rheumatoid arthritis with rheumatoid factor of multiple sites without organ or systems involvement: Secondary | ICD-10-CM | POA: Diagnosis not present

## 2021-01-15 DIAGNOSIS — E782 Mixed hyperlipidemia: Secondary | ICD-10-CM | POA: Diagnosis not present

## 2021-01-15 DIAGNOSIS — Z1231 Encounter for screening mammogram for malignant neoplasm of breast: Secondary | ICD-10-CM | POA: Diagnosis not present

## 2021-01-15 DIAGNOSIS — Z79899 Other long term (current) drug therapy: Secondary | ICD-10-CM | POA: Diagnosis not present

## 2021-01-18 ENCOUNTER — Other Ambulatory Visit: Payer: Self-pay | Admitting: Internal Medicine

## 2021-01-18 DIAGNOSIS — Z1231 Encounter for screening mammogram for malignant neoplasm of breast: Secondary | ICD-10-CM

## 2021-02-17 ENCOUNTER — Ambulatory Visit
Admission: RE | Admit: 2021-02-17 | Discharge: 2021-02-17 | Disposition: A | Discharge status: Home / Self Care | Payer: PPO | Source: Ambulatory Visit | Attending: Internal Medicine | Admitting: Internal Medicine

## 2021-02-17 ENCOUNTER — Other Ambulatory Visit: Payer: Self-pay

## 2021-02-17 DIAGNOSIS — Z1231 Encounter for screening mammogram for malignant neoplasm of breast: Secondary | ICD-10-CM | POA: Diagnosis not present

## 2021-03-11 DIAGNOSIS — G8929 Other chronic pain: Secondary | ICD-10-CM | POA: Diagnosis not present

## 2021-03-11 DIAGNOSIS — M810 Age-related osteoporosis without current pathological fracture: Secondary | ICD-10-CM | POA: Diagnosis not present

## 2021-03-11 DIAGNOSIS — Z9889 Other specified postprocedural states: Secondary | ICD-10-CM | POA: Diagnosis not present

## 2021-03-11 DIAGNOSIS — E118 Type 2 diabetes mellitus with unspecified complications: Secondary | ICD-10-CM | POA: Diagnosis not present

## 2021-03-11 DIAGNOSIS — Z0000 Encounter for general adult medical examination without abnormal findings: Secondary | ICD-10-CM | POA: Diagnosis not present

## 2021-03-11 DIAGNOSIS — M545 Low back pain, unspecified: Secondary | ICD-10-CM | POA: Diagnosis not present

## 2021-03-11 DIAGNOSIS — K449 Diaphragmatic hernia without obstruction or gangrene: Secondary | ICD-10-CM | POA: Diagnosis not present

## 2021-03-11 DIAGNOSIS — E782 Mixed hyperlipidemia: Secondary | ICD-10-CM | POA: Diagnosis not present

## 2021-03-11 DIAGNOSIS — M0579 Rheumatoid arthritis with rheumatoid factor of multiple sites without organ or systems involvement: Secondary | ICD-10-CM | POA: Diagnosis not present

## 2021-03-11 DIAGNOSIS — J439 Emphysema, unspecified: Secondary | ICD-10-CM | POA: Diagnosis not present

## 2021-03-11 DIAGNOSIS — I10 Essential (primary) hypertension: Secondary | ICD-10-CM | POA: Diagnosis not present

## 2021-03-11 DIAGNOSIS — R0602 Shortness of breath: Secondary | ICD-10-CM | POA: Diagnosis not present

## 2021-03-29 ENCOUNTER — Other Ambulatory Visit: Payer: Self-pay

## 2021-03-29 ENCOUNTER — Ambulatory Visit (INDEPENDENT_AMBULATORY_CARE_PROVIDER_SITE_OTHER): Payer: PPO

## 2021-03-29 ENCOUNTER — Ambulatory Visit
Admission: EM | Admit: 2021-03-29 | Discharge: 2021-03-29 | Disposition: A | Discharge status: Home / Self Care | Payer: PPO | Attending: Physician Assistant | Admitting: Physician Assistant

## 2021-03-29 DIAGNOSIS — S92355A Nondisplaced fracture of fifth metatarsal bone, left foot, initial encounter for closed fracture: Secondary | ICD-10-CM

## 2021-03-29 DIAGNOSIS — W19XXXA Unspecified fall, initial encounter: Secondary | ICD-10-CM

## 2021-03-29 DIAGNOSIS — W07XXXA Fall from chair, initial encounter: Secondary | ICD-10-CM | POA: Diagnosis not present

## 2021-03-29 DIAGNOSIS — M79672 Pain in left foot: Secondary | ICD-10-CM | POA: Diagnosis not present

## 2021-03-29 DIAGNOSIS — M7989 Other specified soft tissue disorders: Secondary | ICD-10-CM | POA: Diagnosis not present

## 2021-03-29 NOTE — ED Provider Notes (Signed)
[facility]    CSN: [id number] Arrival date & time: 03/29/21  1249      History   Chief Complaint Chief Complaint  Patient presents with   Fall    HPI Wanda [place] is a 80 y.o. female who presents with L lateral foot pain since yesterday when she stood up from her recliner and had lightheaded and fell down. She scrapped her L elbow on a statue which she broke. Has hx of feeling lightheaded off and on, but has not addressed it with her PCP. Denies LOC. Has pain on her L lateral foot which is swollen and bruised.     Past Medical History:  Diagnosis Date   Anemia    Arthritis    rhuematoid,OSTEOARTHRITIS   Cellulitis    Diabetes mellitus without complication (HCC)    type II   GERD (gastroesophageal reflux disease)    Gout    Hyperlipidemia    Hypertension    Neuromuscular disorder ([place])    left foot neuropathy   Osteoporosis    Rosacea    Spinal stenosis of lumbar region    Stroke [facility])    x2, 9/10   Temporal arteritis ([place])    Wears dentures    full top, partial bottom    There are no problems to display for this patient.   Past Surgical History:  Procedure Laterality Date   ABDOMINAL HYSTERECTOMY     APPENDECTOMY     BACK SURGERY     lumbar and cervical laminectomies   BREAST BIOPSY Left 1960,1980   twice negative results   CAPSULOTOMY METATARSOPHALANGEAL Left 01/22/2015   Procedure: CAPSULOTOMY METATARSOPHALANGEAL;  Surgeon: Albertine Patricia, DPM;  Location: [place];  Service: Podiatry;  Laterality: Left;   CATARACT EXTRACTION W/PHACO Left 10/19/2016   Procedure: CATARACT EXTRACTION PHACO AND INTRAOCULAR LENS PLACEMENT (IOC)  Left Eye  Diabetic;  Surgeon: Leandrew Koyanagi, MD;  Location: [place];  Service: Ophthalmology;  Laterality: Left;  Diabetic - oral meds Left Eye   CATARACT EXTRACTION W/PHACO Right 11/16/2016   Procedure: CATARACT EXTRACTION PHACO AND INTRAOCULAR LENS PLACEMENT (IOC) dIABETIC;  Surgeon: Leandrew Koyanagi, MD;  Location: [place];  Service: Ophthalmology;  Laterality: Right;  DIABETES - oral meds   COLONOSCOPY WITH PROPOFOL N/A 04/24/2018   Procedure: COLONOSCOPY WITH PROPOFOL;  Surgeon: Toledo, Benay Pike, MD;  Location: [facility];  Service: Gastroenterology;  Laterality: N/A;   ESOPHAGOGASTRODUODENOSCOPY (EGD) WITH PROPOFOL N/A 04/24/2018   Procedure: ESOPHAGOGASTRODUODENOSCOPY (EGD) WITH PROPOFOL;  Surgeon: Toledo, Benay Pike, MD;  Location: [facility];  Service: Gastroenterology;  Laterality: N/A;   FOOT SURGERY     HALLUX VALGUS AKIN Left 01/22/2015   Procedure: HALLUX VALGUS AKIN;  Surgeon: Albertine Patricia, DPM;  Location: [place];  Service: Podiatry;  Laterality: Left;  left great toe   HEMORRHOID SURGERY     KNEE ARTHROSCOPY     LUMBAR LAMINECTOMY  04/23/13   NOSE SURGERY     TONSILLECTOMY     URETER SURGERY      OB History   No obstetric history on file.      Home Medications    Prior to Admission medications   Medication Sig Start Date End Date Taking? Authorizing Provider  acetaminophen (TYLENOL) 500 MG tablet Take 1,000 mg by mouth at bedtime.    [provider]  acyclovir ointment (ZOVIRAX) 5 % Apply 1 application topically every 3 (three) hours.    [provider]  alendronate (FOSAMAX)  70 MG tablet Take 70 mg by mouth once a week. Take with a full glass of water on an empty stomach.    [provider]  amLODipine (NORVASC) 2.5 MG tablet Take 2.5 mg by mouth daily. AM    [provider]  aspirin 81 MG tablet Take 81 mg by mouth daily. AM    [provider]  atenolol (TENORMIN) 25 MG tablet Take 25 mg by mouth daily. PM    [provider]  atorvastatin (LIPITOR) 80 MG tablet Take 80 mg by mouth daily. PM    [provider]  Biotin 1 MG CAPS Take 2.5 mg by mouth daily. PM    [provider]  calcium citrate-vitamin D (CITRACAL+D) 315-200 MG-UNIT per tablet Take 1  tablet by mouth daily. AM    [provider]  Cholecalciferol (VITAMIN D-3 PO) Take 1,000 mg by mouth daily. AM    [provider]  Cyanocobalamin (VITAMIN B12 PO) Take 1,000 mcg by mouth daily. AM    [provider]  folic acid (FOLVITE) 035 MCG tablet Take 400 mcg by mouth daily. AM    [provider]  gabapentin (NEURONTIN) 100 MG capsule Take 100 mg by mouth 3 (three) times daily.    [provider]  HYDROcodone-acetaminophen (NORCO/VICODIN) 5-325 MG per tablet Take 1 tablet by mouth every 6 (six) hours as needed for moderate pain (may take 2).    [provider]  latanoprost (XALATAN) 0.005 % ophthalmic solution 1 drop at bedtime.    [provider]  metFORMIN (GLUCOPHAGE) 500 MG tablet Take 500 mg by mouth daily. AM    [provider]  methotrexate (RHEUMATREX) 2.5 MG tablet Take 2.5 mg by mouth once a week. 4 tabs every  Wednesday    [provider]  omeprazole (PRILOSEC) 20 MG capsule Take 20 mg by mouth daily. AM    [provider]  oxyCODONE-acetaminophen (ROXICET) 5-325 MG per tablet Take 1 tablet by mouth every 4 (four) hours as needed for severe pain. Patient not taking: Reported on 10/13/2016 01/24/15   [name] L, PA-C  potassium chloride (K-DUR,KLOR-CON) 10 MEQ tablet Take 10 mEq by mouth daily. AM    [provider]  prednisoLONE 5 MG TABS tablet Take 5 mg by mouth every other day.    [provider]  predniSONE (DELTASONE) 5 MG tablet Take 5 mg by mouth daily with breakfast.    [provider]  sertraline (ZOLOFT) 50 MG tablet Take 50 mg by mouth daily. PM    [provider]    Family History Family History  Problem Relation Age of Onset   Breast cancer Neg Hx     Social History Social History   Tobacco Use   Smoking status: Never   Smokeless tobacco: Never  Vaping Use   Vaping Use: Never used  Substance Use Topics   Alcohol use: No    Drug use: Not Currently     Allergies   Codeine sulfate and Remicade [infliximab]   Review of Systems Review of Systems  Constitutional:  Negative for fatigue and fever.  Eyes:  Negative for visual disturbance.  Respiratory:  Negative for chest tightness and shortness of breath.   Cardiovascular:  Negative for chest pain and palpitations.  Musculoskeletal:  Positive for arthralgias, gait problem and joint swelling. Negative for back pain, neck pain and neck stiffness.  Skin:  Positive for color change and wound.  Neurological:  Positive for  dizziness. Negative for weakness, numbness and headaches.    Physical Exam Triage Vital Signs ED Triage Vitals  Enc Vitals Group     BP 03/29/21 1350 (!) 150/63     Pulse Rate 03/29/21 1350 80     Resp 03/29/21 1350 19     Temp 03/29/21 1350 98 F (36.7 C)     Temp src --      SpO2 03/29/21 1350 97 %     Weight --      Height --      Head Circumference --      Peak Flow --      Pain Score 03/29/21 1346 10     Pain Loc --      Pain Edu? --      Excl. in [place]? --    Orthostatic VS for the past 24 hrs:  BP- Sitting Pulse- Sitting BP- Standing at 0 minutes Pulse- Standing at 0 minutes  03/29/21 1510 158/85 80 145/65 80    Updated Vital Signs BP (!) 150/63   Pulse 80   Temp 98 F (36.7 C)   Resp 19   SpO2 97%   Visual Acuity Right Eye Distance:   Left Eye Distance:   Bilateral Distance:    Right Eye Near:   Left Eye Near:    Bilateral Near:     Physical Exam Vitals and nursing note reviewed.  Constitutional:      General: She is not in acute distress.    Appearance: She is normal weight. She is not toxic-appearing.  HENT:     Head: Normocephalic.     Right Ear: External ear normal.     Left Ear: External ear normal.  Eyes:     General: No scleral icterus.    Extraocular Movements: Extraocular movements intact.     Conjunctiva/sclera: Conjunctivae normal.     Pupils: Pupils are equal, round, and reactive to light.   Cardiovascular:     Rate and Rhythm: Regular rhythm.     Pulses: Normal pulses.     Heart sounds: No murmur heard. Pulmonary:     Effort: Pulmonary effort is normal.     Breath sounds: Normal breath sounds.  Musculoskeletal:     Cervical back: Neck supple. No tenderness.     Right lower leg: No edema.     Left lower leg: No edema.     Comments: L FOOT- has ecchymosis, tenderness and swelling of lateral foot area. Has decreased ROM of foot. Ankle is not tender. Toes are normal.   Skin:    General: Skin is warm and dry.     Findings: Bruising present.     Comments: Has a 1 cm x 1 cm wound missing the dermiss on her lateral elbow. It is not bleeding.   Neurological:     Mental Status: She is alert and oriented to person, place, and time.     Gait: Gait normal.  Psychiatric:        Mood and Affect: Mood normal.        Behavior: Behavior normal.        Thought Content: Thought content normal.        Judgment: Judgment normal.     UC Treatments / Results  Labs (all labs ordered are listed, but only abnormal results are displayed) Labs Reviewed - No data to display  EKG   Radiology DG Foot Complete Left  Result Date: 03/29/2021 CLINICAL DATA:  Swelling. Fall when standing  from chair yesterday. Pain a bowel lateral on top of foot. EXAM: LEFT FOOT - COMPLETE 3+ VIEW COMPARISON:  None. FINDINGS: Acute nondisplaced fracture involving the base of the fifth metatarsal. No definitive intra-articular extension. Remote healed fracture of the second and third metatarsal shafts. Surgical anchor in the first proximal phalanx. Abnormal alignment of the third toe at the proximal phalangeal joint with the middle phalanx apparently dorsally subluxed with respect to the proximal phalanx. Mild midfoot degenerative change with spurring. Plantar calcaneal spur and Achilles tendon enthesophyte. There prominent vascular calcifications. IMPRESSION: 1. Acute nondisplaced fracture involving the base of the  fifth metatarsal. 2. Abnormal alignment of the third toe at the metatarsophalangeal joint, may be chronic. Recommend correlation with physical exam. 3. Remote healed second and third metatarsal shaft fractures. Electronically Signed   By: Keith Rake M.D.   On: 03/29/2021 14:37    Procedures Procedures (including critical care time)  Medications Ordered in UC Medications - No data to display  Initial Impression / Assessment and Plan / UC Course  I have reviewed the triage vital signs and the nursing notes. Pertinent  imaging results that were available during my care of the patient were reviewed by me and considered in my medical decision making (see chart for details). Has L 5th metatarsal fracture. Was placed on Post op shoe and will FU with her foot orthopedist.  See instructions.    Final Clinical Impressions(s) / UC Diagnoses   Final diagnoses:  Closed nondisplaced fracture of fifth metatarsal bone of left foot, initial encounter  Fall, initial encounter     Discharge Instructions      Follow up with your bone doctor this week.  Follow up with your family Dr this week also regarding getting light headed when you get up. You may benefit from using a walker and have home health do a home visit to help with fall prevention.  You may take your hydrocodone as needed for pain.       ED Prescriptions   None    PDMP not reviewed this encounter.   Shelby Mattocks, Hershal Coria 03/29/21 2124

## 2021-03-29 NOTE — Discharge Instructions (Addendum)
Follow up with your bone doctor this week.  Follow up with your family Dr this week also regarding getting light headed when you get up. You may benefit from using a walker and have home health do a home visit to help with fall prevention.  You may take your hydrocodone as needed for pain.

## 2021-03-29 NOTE — ED Triage Notes (Addendum)
Pt presents with complaints of falling when standing from a chair yesterday. Pt is having pain in her left foot. Denies any other injuries from the fall. Reports she stood to go into the kitchen and the next thing she knew she was on the floor. Pt states "I do not think I passed out." pt did hit her head during the incident.

## 2021-04-05 DIAGNOSIS — R0602 Shortness of breath: Secondary | ICD-10-CM | POA: Diagnosis not present

## 2021-04-05 DIAGNOSIS — I10 Essential (primary) hypertension: Secondary | ICD-10-CM | POA: Diagnosis not present

## 2021-04-22 DIAGNOSIS — R0602 Shortness of breath: Secondary | ICD-10-CM | POA: Diagnosis not present

## 2021-04-22 DIAGNOSIS — I361 Nonrheumatic tricuspid (valve) insufficiency: Secondary | ICD-10-CM | POA: Diagnosis not present

## 2021-04-22 DIAGNOSIS — E782 Mixed hyperlipidemia: Secondary | ICD-10-CM | POA: Diagnosis not present

## 2021-04-22 DIAGNOSIS — I10 Essential (primary) hypertension: Secondary | ICD-10-CM | POA: Diagnosis not present

## 2021-04-22 DIAGNOSIS — I619 Nontraumatic intracerebral hemorrhage, unspecified: Secondary | ICD-10-CM | POA: Diagnosis not present

## 2021-04-22 DIAGNOSIS — M0579 Rheumatoid arthritis with rheumatoid factor of multiple sites without organ or systems involvement: Secondary | ICD-10-CM | POA: Diagnosis not present

## 2021-04-22 DIAGNOSIS — E118 Type 2 diabetes mellitus with unspecified complications: Secondary | ICD-10-CM | POA: Diagnosis not present

## 2021-04-22 DIAGNOSIS — I071 Rheumatic tricuspid insufficiency: Secondary | ICD-10-CM | POA: Insufficient documentation

## 2021-05-07 DIAGNOSIS — R0600 Dyspnea, unspecified: Secondary | ICD-10-CM | POA: Diagnosis not present

## 2021-05-07 DIAGNOSIS — R0689 Other abnormalities of breathing: Secondary | ICD-10-CM | POA: Diagnosis not present

## 2021-05-13 ENCOUNTER — Other Ambulatory Visit: Payer: Self-pay | Admitting: Pulmonary Disease

## 2021-05-13 ENCOUNTER — Other Ambulatory Visit: Payer: Self-pay

## 2021-05-13 ENCOUNTER — Ambulatory Visit
Admission: RE | Admit: 2021-05-13 | Discharge: 2021-05-13 | Disposition: A | Discharge status: Home / Self Care | Payer: PPO | Source: Ambulatory Visit | Attending: Pulmonary Disease | Admitting: Pulmonary Disease

## 2021-05-13 DIAGNOSIS — K449 Diaphragmatic hernia without obstruction or gangrene: Secondary | ICD-10-CM | POA: Diagnosis not present

## 2021-05-13 DIAGNOSIS — R7989 Other specified abnormal findings of blood chemistry: Secondary | ICD-10-CM | POA: Diagnosis not present

## 2021-05-13 DIAGNOSIS — I517 Cardiomegaly: Secondary | ICD-10-CM | POA: Diagnosis not present

## 2021-05-13 DIAGNOSIS — R0602 Shortness of breath: Secondary | ICD-10-CM | POA: Diagnosis not present

## 2021-05-13 DIAGNOSIS — J9811 Atelectasis: Secondary | ICD-10-CM | POA: Diagnosis not present

## 2021-05-13 LAB — POCT I-STAT CREATININE: Creatinine, Ser: 0.6 mg/dL (ref 0.44–1.00)

## 2021-05-13 MED ORDER — IOHEXOL 350 MG/ML SOLN
75.0000 mL | Freq: Once | INTRAVENOUS | Status: AC | PRN
  Administered 2021-05-13: 75 mL via INTRAVENOUS

## 2021-06-01 DIAGNOSIS — R1010 Upper abdominal pain, unspecified: Secondary | ICD-10-CM | POA: Diagnosis not present

## 2021-06-01 DIAGNOSIS — K449 Diaphragmatic hernia without obstruction or gangrene: Secondary | ICD-10-CM | POA: Diagnosis not present

## 2021-06-01 DIAGNOSIS — R0609 Other forms of dyspnea: Secondary | ICD-10-CM | POA: Diagnosis not present

## 2021-06-16 ENCOUNTER — Other Ambulatory Visit: Payer: Self-pay

## 2021-06-16 ENCOUNTER — Encounter: Payer: Self-pay | Admitting: Surgery

## 2021-06-16 ENCOUNTER — Ambulatory Visit: Payer: PPO | Admitting: Surgery

## 2021-06-16 VITALS — BP 117/66 | HR 89 | Temp 99.0°F | Ht 64.0 in | Wt 102.0 lb

## 2021-06-16 DIAGNOSIS — K449 Diaphragmatic hernia without obstruction or gangrene: Secondary | ICD-10-CM | POA: Diagnosis not present

## 2021-06-16 DIAGNOSIS — I619 Nontraumatic intracerebral hemorrhage, unspecified: Secondary | ICD-10-CM | POA: Insufficient documentation

## 2021-06-16 DIAGNOSIS — L719 Rosacea, unspecified: Secondary | ICD-10-CM | POA: Insufficient documentation

## 2021-06-16 DIAGNOSIS — M1990 Unspecified osteoarthritis, unspecified site: Secondary | ICD-10-CM | POA: Insufficient documentation

## 2021-06-16 DIAGNOSIS — K635 Polyp of colon: Secondary | ICD-10-CM | POA: Insufficient documentation

## 2021-06-16 DIAGNOSIS — M0579 Rheumatoid arthritis with rheumatoid factor of multiple sites without organ or systems involvement: Secondary | ICD-10-CM | POA: Insufficient documentation

## 2021-06-16 DIAGNOSIS — M810 Age-related osteoporosis without current pathological fracture: Secondary | ICD-10-CM | POA: Insufficient documentation

## 2021-06-16 DIAGNOSIS — K219 Gastro-esophageal reflux disease without esophagitis: Secondary | ICD-10-CM | POA: Insufficient documentation

## 2021-06-16 DIAGNOSIS — L03119 Cellulitis of unspecified part of limb: Secondary | ICD-10-CM | POA: Insufficient documentation

## 2021-06-16 NOTE — Patient Instructions (Addendum)
I will call you with the appointment information regarding scheduling your EGD with DR.Lysle Pearl at [facility].   Barium swallow study scheduled 06/28/2021 @ 8:45 am @ [place]. Please enter through the medical mall entrance and check in at the registration desk. Nothing to eat/after midnight.

## 2021-06-17 ENCOUNTER — Telehealth: Payer: Self-pay

## 2021-06-17 ENCOUNTER — Encounter: Payer: Self-pay | Admitting: Surgery

## 2021-06-17 NOTE — Telephone Encounter (Signed)
Barium swallow study 06/28/21 @ 8:45.  Dr.Sakai 06/21/21 @ 8:45 am.  Dr.Pabon 07/26/21 @ 9 am  Patient notified of the above appointments.

## 2021-06-17 NOTE — Progress Notes (Signed)
Patient ID: Wanda [place], female   DOB: 1941-01-31, 80 y.o.   MRN: [id number]  HPI Wanda [place] is a 80 y.o. female seen in consultation at the request of Dr.Aleskerov for mild paraesophageal hernia.  Her main complaint is dyspnea on exertion.  She also endorses significant reflux disease.  Worsening when she lays on her back.  PPI only partially relieves reflux symptoms.  She denies any dysphagia.  He does have a history of rheumatoid arthritis and is followed by Dr. Jefm Bryant. 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 Also had had work-up with cardiology Dr. Saralyn Pilar showing severe tricuspid regurgitation and moderate aortic insufficiency with normal ventricular function. She does have significant anemia with a hemoglobin of 8 normal platelet count and normal white count, CMP is completely normal.  HPI  Past Medical History:  Diagnosis Date   Anemia    Arthritis    rhuematoid,OSTEOARTHRITIS   Cellulitis    Diabetes mellitus without complication (HCC)    type II   GERD (gastroesophageal reflux disease)    Gout    Hyperlipidemia    Hypertension    Neuromuscular disorder ([place])    left foot neuropathy   Osteoporosis    Rosacea    Spinal stenosis of lumbar region    Stroke [facility])    x2, 9/10   Temporal arteritis ([place])    Wears dentures    full top, partial bottom    Past Surgical History:  Procedure Laterality Date   ABDOMINAL HYSTERECTOMY     APPENDECTOMY     BACK SURGERY     lumbar and cervical laminectomies   BREAST BIOPSY Left 1960,1980   twice negative results   CAPSULOTOMY METATARSOPHALANGEAL Left 01/22/2015   Procedure: CAPSULOTOMY METATARSOPHALANGEAL;  Surgeon: Albertine Patricia, DPM;  Location: [place];  Service: Podiatry;  Laterality: Left;   CATARACT EXTRACTION W/PHACO Left 10/19/2016   Procedure: CATARACT EXTRACTION  PHACO AND INTRAOCULAR LENS PLACEMENT (IOC)  Left Eye  Diabetic;  Surgeon: Leandrew Koyanagi, MD;  Location: [place];  Service: Ophthalmology;  Laterality: Left;  Diabetic - oral meds Left Eye   CATARACT EXTRACTION W/PHACO Right 11/16/2016   Procedure: CATARACT EXTRACTION PHACO AND INTRAOCULAR LENS PLACEMENT (IOC) dIABETIC;  Surgeon: Leandrew Koyanagi, MD;  Location: [place];  Service: Ophthalmology;  Laterality: Right;  DIABETES - oral meds   COLONOSCOPY WITH PROPOFOL N/A 04/24/2018   Procedure: COLONOSCOPY WITH PROPOFOL;  Surgeon: Toledo, Benay Pike, MD;  Location: [facility];  Service: Gastroenterology;  Laterality: N/A;   ESOPHAGOGASTRODUODENOSCOPY (EGD) WITH PROPOFOL N/A 04/24/2018   Procedure: ESOPHAGOGASTRODUODENOSCOPY (EGD) WITH PROPOFOL;  Surgeon: Toledo, Benay Pike, MD;  Location: [facility];  Service: Gastroenterology;  Laterality: N/A;   FOOT SURGERY     HALLUX VALGUS AKIN Left 01/22/2015   Procedure: HALLUX VALGUS AKIN;  Surgeon: Albertine Patricia, DPM;  Location: [place];  Service: Podiatry;  Laterality: Left;  left great toe   HEMORRHOID SURGERY     KNEE ARTHROSCOPY     LUMBAR LAMINECTOMY  04/23/13   NOSE SURGERY     TONSILLECTOMY     URETER SURGERY      Family History  Problem Relation Age of Onset   Diabetes Father    Stroke Father    Breast cancer Neg Hx     Social History Social History   Tobacco Use  Smoking status: Never   Smokeless tobacco: Never  Vaping Use   Vaping Use: Never used  Substance Use Topics   Alcohol use: No   Drug use: Not Currently    Allergies  Allergen Reactions   Codeine Sulfate Nausea Only   Remicade [Infliximab] Other (See Comments)    Infection    Current Outpatient Medications  Medication Sig Dispense Refill   acetaminophen (TYLENOL) 500 MG tablet Take 1,000 mg by mouth at bedtime.     acyclovir ointment (ZOVIRAX) 5 % Apply 1 application topically every 3 (three) hours.     alendronate  (FOSAMAX) 70 MG tablet Take 70 mg by mouth once a week. Take with a full glass of water on an empty stomach.     aspirin 81 MG tablet Take 81 mg by mouth daily. AM     atorvastatin (LIPITOR) 80 MG tablet Take 80 mg by mouth daily. PM     calcium citrate-vitamin D (CITRACAL+D) 315-200 MG-UNIT per tablet Take 1 tablet by mouth daily. AM     Cholecalciferol (VITAMIN D-3 PO) Take 1,000 mg by mouth daily. AM     folic acid (FOLVITE) 332 MCG tablet Take 400 mcg by mouth daily. AM     gabapentin (NEURONTIN) 100 MG capsule Take 100 mg by mouth 3 (three) times daily.     latanoprost (XALATAN) 0.005 % ophthalmic solution 1 drop at bedtime.     metFORMIN (GLUCOPHAGE) 500 MG tablet Take 500 mg by mouth daily. AM     methotrexate (RHEUMATREX) 2.5 MG tablet Take 2.5 mg by mouth once a week. 4 tabs every  Wednesday     omeprazole (PRILOSEC) 20 MG capsule Take 20 mg by mouth daily. AM     potassium chloride (K-DUR,KLOR-CON) 10 MEQ tablet Take 10 mEq by mouth daily. AM     prednisoLONE 5 MG TABS tablet Take 5 mg by mouth every other day.     sertraline (ZOLOFT) 50 MG tablet Take 50 mg by mouth daily. PM     No current facility-administered medications for this visit.     Review of Systems Full ROS  was asked and was negative except for the information on the HPI  Physical Exam Blood pressure 117/66, pulse 89, temperature 99 F (37.2 C), temperature source Oral, height 5\' 4"  (1.626 m), weight 102 lb (46.3 kg). CONSTITUTIONAL: NAd, BMI 17,  EYES: Pupils are equal, round,  Sclera are non-icteric. EARS, NOSE, MOUTH AND THROAT: she is wearing a mask Hearing is intact to voice. LYMPH NODES:  Lymph nodes in the neck are normal. RESPIRATORY:  Lungs are clear. There is normal respiratory effort, with equal breath sounds bilaterally, and without pathologic use of accessory muscles. CARDIOVASCULAR: Heart is regular without murmurs, gallops, or rubs. GI: The abdomen is soft, nontender, and nondistended. There are  no palpable masses. There is no hepatosplenomegaly. There are normal bowel sounds in all quadrants. GU: Rectal deferred.   MUSCULOSKELETAL: Normal muscle strength and tone. No cyanosis or edema.   SKIN: Turgor is good and there are no pathologic skin lesions or ulcers. NEUROLOGIC: Motor and sensation is grossly normal. Cranial nerves are grossly intact. PSYCH:  Oriented to person, place and time. Affect is normal.  Data Reviewed  I have personally reviewed the patient's imaging, laboratory findings and medical records.    Assessment/Plan 80 year old female with multiple medical issues mainly dyspnea on exertion and reflux.  I do think that the large paraesophageal hernia is contributing to some of the  pulmonary symptoms however it remains possible that this is the primary issue.  She does have anemia that may explain some of the dyspnea on exertion and she does have some valvular disease as well as a cardiomegaly.  She wishes to proceed with further work-up.  We will make sure we obtain an upper endoscopy as well as a barium swallow.  Regarding surgical intervention and repair of paraesophageal hernia technically this would not be an issue.  She seems to be interested in this.  I did state to her that if we fix the paraesophageal hernia there is no guarantee that her pulmonary symptoms will completely resolve as I do think that she has medical issues that also could explain her dyspnea on exertion.  She understands the situation Extensive counseling provided Time spent with the patient was 60 minutes, with more than 50% of the time spent in face-to-face education, counseling and care coordination.     Caroleen Hamman, MD FACS General Surgeon 06/17/2021, 4:34 PM

## 2021-06-18 DIAGNOSIS — M0579 Rheumatoid arthritis with rheumatoid factor of multiple sites without organ or systems involvement: Secondary | ICD-10-CM | POA: Diagnosis not present

## 2021-06-18 DIAGNOSIS — M5489 Other dorsalgia: Secondary | ICD-10-CM | POA: Diagnosis not present

## 2021-06-18 DIAGNOSIS — M546 Pain in thoracic spine: Secondary | ICD-10-CM | POA: Diagnosis not present

## 2021-06-18 DIAGNOSIS — E118 Type 2 diabetes mellitus with unspecified complications: Secondary | ICD-10-CM | POA: Diagnosis not present

## 2021-06-18 DIAGNOSIS — W108XXA Fall (on) (from) other stairs and steps, initial encounter: Secondary | ICD-10-CM | POA: Diagnosis not present

## 2021-06-18 DIAGNOSIS — Y92009 Unspecified place in unspecified non-institutional (private) residence as the place of occurrence of the external cause: Secondary | ICD-10-CM | POA: Diagnosis not present

## 2021-06-18 DIAGNOSIS — M545 Low back pain, unspecified: Secondary | ICD-10-CM | POA: Diagnosis not present

## 2021-06-21 DIAGNOSIS — K449 Diaphragmatic hernia without obstruction or gangrene: Secondary | ICD-10-CM | POA: Diagnosis not present

## 2021-06-22 DIAGNOSIS — I10 Essential (primary) hypertension: Secondary | ICD-10-CM | POA: Diagnosis not present

## 2021-06-22 DIAGNOSIS — E118 Type 2 diabetes mellitus with unspecified complications: Secondary | ICD-10-CM | POA: Diagnosis not present

## 2021-06-22 DIAGNOSIS — Z79899 Other long term (current) drug therapy: Secondary | ICD-10-CM | POA: Diagnosis not present

## 2021-06-22 DIAGNOSIS — R0602 Shortness of breath: Secondary | ICD-10-CM | POA: Diagnosis not present

## 2021-06-22 DIAGNOSIS — M0579 Rheumatoid arthritis with rheumatoid factor of multiple sites without organ or systems involvement: Secondary | ICD-10-CM | POA: Diagnosis not present

## 2021-06-22 DIAGNOSIS — M810 Age-related osteoporosis without current pathological fracture: Secondary | ICD-10-CM | POA: Diagnosis not present

## 2021-06-22 DIAGNOSIS — E7800 Pure hypercholesterolemia, unspecified: Secondary | ICD-10-CM | POA: Diagnosis not present

## 2021-06-22 DIAGNOSIS — R8290 Unspecified abnormal findings in urine: Secondary | ICD-10-CM | POA: Diagnosis not present

## 2021-06-22 DIAGNOSIS — K449 Diaphragmatic hernia without obstruction or gangrene: Secondary | ICD-10-CM | POA: Diagnosis not present

## 2021-06-28 ENCOUNTER — Ambulatory Visit
Admission: RE | Admit: 2021-06-28 | Discharge: 2021-06-28 | Disposition: A | Discharge status: Home / Self Care | Payer: PPO | Source: Ambulatory Visit | Attending: Surgery | Admitting: Surgery

## 2021-06-28 ENCOUNTER — Other Ambulatory Visit: Payer: Self-pay | Admitting: Surgery

## 2021-06-28 DIAGNOSIS — M0579 Rheumatoid arthritis with rheumatoid factor of multiple sites without organ or systems involvement: Secondary | ICD-10-CM | POA: Diagnosis not present

## 2021-06-28 DIAGNOSIS — K449 Diaphragmatic hernia without obstruction or gangrene: Secondary | ICD-10-CM | POA: Insufficient documentation

## 2021-06-28 DIAGNOSIS — K219 Gastro-esophageal reflux disease without esophagitis: Secondary | ICD-10-CM | POA: Diagnosis not present

## 2021-06-28 DIAGNOSIS — Z79899 Other long term (current) drug therapy: Secondary | ICD-10-CM | POA: Diagnosis not present

## 2021-06-28 DIAGNOSIS — K224 Dyskinesia of esophagus: Secondary | ICD-10-CM | POA: Diagnosis not present

## 2021-06-28 DIAGNOSIS — Z01818 Encounter for other preprocedural examination: Secondary | ICD-10-CM | POA: Diagnosis not present

## 2021-06-30 ENCOUNTER — Telehealth: Payer: Self-pay

## 2021-06-30 ENCOUNTER — Encounter: Payer: Self-pay | Admitting: Surgery

## 2021-06-30 NOTE — Telephone Encounter (Signed)
Patient notiifed of Barium swallow results- EGD 07/01/21 with Dr.Sakai and follow up with Dr.Pabon 07/12/21.

## 2021-07-01 ENCOUNTER — Ambulatory Visit: Payer: PPO | Admitting: Anesthesiology

## 2021-07-01 ENCOUNTER — Encounter
Admission: RE | Disposition: A | Discharge status: Home / Self Care | Payer: Self-pay | Source: Home / Self Care | Attending: Surgery

## 2021-07-01 ENCOUNTER — Encounter: Payer: Self-pay | Admitting: Surgery

## 2021-07-01 ENCOUNTER — Ambulatory Visit
Admission: RE | Admit: 2021-07-01 | Discharge: 2021-07-01 | Disposition: A | Discharge status: Home / Self Care | Payer: PPO | Attending: Surgery | Admitting: Surgery

## 2021-07-01 DIAGNOSIS — Z86010 Personal history of colonic polyps: Secondary | ICD-10-CM | POA: Diagnosis not present

## 2021-07-01 DIAGNOSIS — Z885 Allergy status to narcotic agent status: Secondary | ICD-10-CM | POA: Insufficient documentation

## 2021-07-01 DIAGNOSIS — Z833 Family history of diabetes mellitus: Secondary | ICD-10-CM | POA: Diagnosis not present

## 2021-07-01 DIAGNOSIS — M810 Age-related osteoporosis without current pathological fracture: Secondary | ICD-10-CM | POA: Insufficient documentation

## 2021-07-01 DIAGNOSIS — E785 Hyperlipidemia, unspecified: Secondary | ICD-10-CM | POA: Diagnosis not present

## 2021-07-01 DIAGNOSIS — M069 Rheumatoid arthritis, unspecified: Secondary | ICD-10-CM | POA: Diagnosis not present

## 2021-07-01 DIAGNOSIS — Z7984 Long term (current) use of oral hypoglycemic drugs: Secondary | ICD-10-CM | POA: Insufficient documentation

## 2021-07-01 DIAGNOSIS — Z823 Family history of stroke: Secondary | ICD-10-CM | POA: Diagnosis not present

## 2021-07-01 DIAGNOSIS — K219 Gastro-esophageal reflux disease without esophagitis: Secondary | ICD-10-CM | POA: Insufficient documentation

## 2021-07-01 DIAGNOSIS — R079 Chest pain, unspecified: Secondary | ICD-10-CM | POA: Diagnosis not present

## 2021-07-01 DIAGNOSIS — Z8673 Personal history of transient ischemic attack (TIA), and cerebral infarction without residual deficits: Secondary | ICD-10-CM | POA: Diagnosis not present

## 2021-07-01 DIAGNOSIS — I10 Essential (primary) hypertension: Secondary | ICD-10-CM | POA: Insufficient documentation

## 2021-07-01 DIAGNOSIS — K449 Diaphragmatic hernia without obstruction or gangrene: Secondary | ICD-10-CM | POA: Insufficient documentation

## 2021-07-01 DIAGNOSIS — M109 Gout, unspecified: Secondary | ICD-10-CM | POA: Insufficient documentation

## 2021-07-01 DIAGNOSIS — Z8249 Family history of ischemic heart disease and other diseases of the circulatory system: Secondary | ICD-10-CM | POA: Insufficient documentation

## 2021-07-01 DIAGNOSIS — Z79899 Other long term (current) drug therapy: Secondary | ICD-10-CM | POA: Insufficient documentation

## 2021-07-01 DIAGNOSIS — E119 Type 2 diabetes mellitus without complications: Secondary | ICD-10-CM | POA: Insufficient documentation

## 2021-07-01 DIAGNOSIS — Z888 Allergy status to other drugs, medicaments and biological substances status: Secondary | ICD-10-CM | POA: Insufficient documentation

## 2021-07-01 HISTORY — PX: ESOPHAGOGASTRODUODENOSCOPY: SHX5428

## 2021-07-01 LAB — GLUCOSE, CAPILLARY: Glucose-Capillary: 76 mg/dL (ref 70–99)

## 2021-07-01 SURGERY — EGD (ESOPHAGOGASTRODUODENOSCOPY)
Anesthesia: General

## 2021-07-01 MED ORDER — LIDOCAINE HCL (PF) 2 % IJ SOLN
INTRAMUSCULAR | Status: DC | PRN
  Administered 2021-07-01: 80 mg via INTRADERMAL

## 2021-07-01 MED ORDER — PROPOFOL 10 MG/ML IV BOLUS
INTRAVENOUS | Status: DC | PRN
  Administered 2021-07-01: 50 mg via INTRAVENOUS

## 2021-07-01 MED ORDER — PROPOFOL 500 MG/50ML IV EMUL
INTRAVENOUS | Status: DC | PRN
  Administered 2021-07-01: 150 ug/kg/min via INTRAVENOUS

## 2021-07-01 MED ORDER — SODIUM CHLORIDE 0.9 % IV SOLN: INTRAVENOUS | Status: DC

## 2021-07-01 NOTE — Op Note (Signed)
[facility] Patient Name: Wanda Monroe Procedure Date: 07/01/2021 11:00 AM MRN: [id number] Account #: [id number] Date of Birth: 25-Feb-1941 Admit Type: Outpatient Age: 80 Room: [facility] ENDO ROOM 3 Gender: Female Note Status: Finalized Instrument Name: Altamese Cabal Endoscope [id number] Procedure:             Upper GI endoscopy Indications:           Hiatal hernia Providers:             Eliseo Squires MD, MD Referring MD:          Leonie Douglas. Doy Hutching, MD (Referring MD) Medicines:             Propofol per Anesthesia Complications:         No immediate complications. Procedure:             Pre-Anesthesia Assessment:                        - After reviewing the risks and benefits, the patient                         was deemed in satisfactory condition to undergo the                         procedure in an ambulatory setting.                        After obtaining informed consent, the endoscope was                         passed under direct vision. Throughout the procedure,                         the patient's blood pressure, pulse, and oxygen                         saturations were monitored continuously. The Endoscope                         was introduced through the mouth, and advanced to the                         second part of duodenum. The upper GI endoscopy was                         accomplished without difficulty. The patient tolerated                         the procedure well. Findings:      The examined duodenum was normal.      The esophagus was normal.      A medium-sized hiatal hernia was present. Impression:            - Normal examined duodenum.                        - Normal esophagus.                        - Medium-sized hiatal hernia.                        -  No specimens collected. Recommendation:        - Discharge patient to home.                        - Resume previous diet.                        - Written discharge instructions  were provided to the                         patient. Procedure Code(s):     --- Professional ---                        [phone], Esophagogastroduodenoscopy, flexible,                         transoral; diagnostic, including collection of                         specimen(s) by brushing or washing, when performed                         (separate procedure) Diagnosis Code(s):     --- Professional ---                        K44.9, Diaphragmatic hernia without obstruction or                         gangrene CPT copyright 2019 American Medical Association. All rights reserved. The codes documented in this report are preliminary and upon coder review may  be revised to meet current compliance requirements. Dr. Sheppard Penton, MD Eliseo Squires MD, MD 07/01/2021 1:41:49 PM This report has been signed electronically. Number of Addenda: 0 Note Initiated On: 07/01/2021 11:00 AM Estimated Blood Loss:  Estimated blood loss: none.      [facility]

## 2021-07-01 NOTE — Anesthesia Postprocedure Evaluation (Signed)
Anesthesia Post Note  Patient: TAMECKA MILHAM  Procedure(s) Performed: ESOPHAGOGASTRODUODENOSCOPY (EGD)  Patient location during evaluation: Endoscopy Anesthesia Type: General Level of consciousness: awake and alert and oriented Pain management: pain level controlled Vital Signs Assessment: post-procedure vital signs reviewed and stable Respiratory status: spontaneous breathing, nonlabored ventilation and respiratory function stable Cardiovascular status: blood pressure returned to baseline and stable Postop Assessment: no signs of nausea or vomiting Anesthetic complications: no   No notable events documented.   Last Vitals:  Vitals:   07/01/21 1353 07/01/21 1402  BP: (!) 141/79 (!) 168/62  Pulse:  99  Resp: 18 20  Temp:    SpO2: 100% 98%    Last Pain:  Vitals:   07/01/21 1402  TempSrc:   PainSc: 0-No pain                 Quanesha Klimaszewski

## 2021-07-01 NOTE — Interval H&P Note (Signed)
History and Physical Interval Note:  07/01/2021 1:18 PM  Wanda Monroe  has presented today for surgery, with the diagnosis of KNOWN HIATAL HERNIA, EGD IS PART OF WORK UP HX GERD.  The various methods of treatment have been discussed with the patient and family. After consideration of risks, benefits and other options for treatment, the patient has consented to  Procedure(s): ESOPHAGOGASTRODUODENOSCOPY (EGD) (N/A) as a surgical intervention.  The patient's history has been reviewed, patient examined, no change in status, stable for surgery.  I have reviewed the patient's chart and labs.  Questions were answered to the patient's satisfaction.     Wanda Monroe

## 2021-07-01 NOTE — Transfer of Care (Signed)
Immediate Anesthesia Transfer of Care Note  Patient: Wanda Monroe  Procedure(s) Performed: ESOPHAGOGASTRODUODENOSCOPY (EGD)  Patient Location: PACU  Anesthesia Type:General  Level of Consciousness: awake, alert  and oriented  Airway & Oxygen Therapy: Patient Spontanous Breathing and Patient connected to face mask oxygen  Post-op Assessment: Report given to RN and Post -op Vital signs reviewed and stable  Post vital signs: Reviewed and stable  Last Vitals:  Vitals Value Taken Time  BP    Temp    Pulse    Resp 16 07/01/21 1344  SpO2    Vitals shown include unvalidated device data.  Last Pain:  Vitals:   07/01/21 1223  TempSrc: Temporal         Complications: No notable events documented.

## 2021-07-01 NOTE — Anesthesia Preprocedure Evaluation (Addendum)
Anesthesia Evaluation  Patient identified by MRN, date of birth, ID band Patient awake    Reviewed: Allergy & Precautions, NPO status , Patient's Chart, lab work & pertinent test results  History of Anesthesia Complications Negative for: history of anesthetic complications  Airway Mallampati: II  TM Distance: >3 FB Neck ROM: Full    Dental  (+) Upper Dentures, Missing, Poor Dentition   Pulmonary neg pulmonary ROS, neg sleep apnea, neg COPD,    breath sounds clear to auscultation- rhonchi (-) wheezing      Cardiovascular hypertension, Pt. on medications (-) CAD, (-) Past MI, (-) Cardiac Stents and (-) CABG  Rhythm:Regular Rate:Normal - Systolic murmurs and - Diastolic murmurs Echo 5/40/08:  NORMAL LEFT VENTRICULAR SYSTOLIC FUNCTION  WITH NO LVH  NORMAL RIGHT VENTRICULAR SYSTOLIC FUNCTION  SEVERE VALVULAR REGURGITATION (See above)  NO VALVULAR STENOSIS  ESTIMATED LVEF >55%  Aortic: MODERATE AI  MILDLY THICKENED, FULLY MOBILE LEAFLETS  Mitral: MILD MR  Tricuspid: SEVERE TR (3.5m/s)  ABNORMAL IVC COLLAPSE, ELEVATED RV PRESSURES  GRADIENTS APPEAR TO BE UNDERESTIMATED WITH DOPPLER CW  Pulmonic: MILD PI  MILD LAE  MODERATE RAE    Neuro/Psych  Neuromuscular disease (neuropathy) CVA (2009, 2010, generalized weakness) negative psych ROS   GI/Hepatic Neg liver ROS, GERD  ,  Endo/Other  diabetes, Type 2, Oral Hypoglycemic Agents  Renal/GU negative Renal ROS     Musculoskeletal  (+) Arthritis ,   Abdominal (+) - obese,   Peds  Hematology  (+) Blood dyscrasia, anemia ,   Anesthesia Other Findings Past Medical History: No date: Anemia No date: Arthritis     Comment:  rhuematoid,OSTEOARTHRITIS No date: Cellulitis No date: 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 No date: GERD (gastroesophageal reflux disease) No date: Gout No date: Hyperlipidemia No date: Hypertension No date:  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 No date: Osteoporosis No date: Rosacea No date: Spinal stenosis of lumbar region No date: Stroke [facility])     Comment:  x2, 9/10 No date: Temporal arteritis (HCC) No date: Wears dentures     Comment:  full top, partial bottom   Reproductive/Obstetrics                            Anesthesia Physical Anesthesia Plan  ASA: 3  Anesthesia Plan: General   Post-op Pain Management:    Induction: Intravenous  PONV Risk Score and Plan: 3 and Propofol infusion, TIVA and Treatment may vary due to age or medical condition  Airway Management Planned: Natural Airway  Additional Equipment:   Intra-op Plan:   Post-operative Plan:   Informed Consent: I have reviewed the patients History and Physical, chart, labs and discussed the procedure including the risks, benefits and alternatives for the proposed anesthesia with the patient or authorized representative who has indicated his/her understanding and acceptance.     Dental advisory given  Plan Discussed with: CRNA  Anesthesia Plan Comments:        Anesthesia Quick Evaluation

## 2021-07-01 NOTE — H&P (Signed)
Subjective:   CC: Hiatal hernia [K44.9]  HPI: Wanda Monroe is a 80 y.o. female who was referred by Richrd Sox, MD for evaluation of above. Known hiatal hernia and requires EGD as part of workup.   Current symptoms include SOB and chest pain. Chronic, no report of significant change compared to previous.  Currently lives at home with nephew, she is alone most of the day.  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  Past Surgical History: has a past surgical history that includes tonsils; Appendectomy; Nose surgery; Breast biopsy; Ureter surgery; Hemorrhoid surgery; Tonsillectomy; laminectomy posterior lumbar facetectomy & foraminotomy w/decomp (N/A, 04/23/2013); laminectomy posterior cervicle decomp w/facetectomy & foraminotomy (N/A, 04/23/2013); Colonoscopy (01/31/2005); Colonoscopy (01/27/2014); egd (01/27/2014); FOOT SURGERY; Hysterectomy; Knee arthroscopy (Right); HIGH BLOOD PRESSURE; Colonoscopy (04/24/2018); and egd (04/24/2018).  Family History: family history includes Alzheimer's disease in her mother; Diabetes type II in her father and another family member; High blood pressure (Hypertension) in her mother; Stroke in her father and another family member.  Social History: reports that she has never smoked. She has never used smokeless tobacco. She reports that she does not drink alcohol and does not use drugs.  Current Medications: has a current  medication list which includes the following prescription(s): aspirin, atorvastatin, onetouch ultra test, cyclobenzaprine, diclofenac, duloxetine, folic acid, furosemide, gabapentin, hydrochlorothiazide, ipratropium, onetouch delica plus lancet, latanoprost, metformin, methotrexate, onetouch ultra2 meter, pantoprazole, potassium chloride, sertraline, timolol maleate, and tizanidine.  Allergies:  Allergies  Allergen Reactions   Codeine Nausea   Remicade [Infliximab] Other (See Comments)  TOE INFECTION   ROS:  A 15 point review of systems was performed and pertinent positives and negatives noted in HPI  Objective:    BP (!) 165/68  Pulse 89  Ht 162.6 cm (5\' 4" )  Wt (!) 44.9 kg (99 lb)  LMP (LMP Unknown)  SpO2 95%  BMI 16.99 kg/m   Constitutional : No distress, cooperative, alert  Lymphatics/Throat: Supple with no lymphadenopathy  Respiratory: Clear to auscultation bilaterally  Cardiovascular: Regular rate and rhythm  Gastrointestinal: Soft, non-tender, non-distended, no organomegaly.  Musculoskeletal: Steady gait and movement  Skin: Cool and moist, no surgical scars  Psychiatric: Normal affect, non-agitated, not confused     LABS:  n/a   RADS: n/a  Assessment:    Hiatal hernia [K44.9]  EGD needed as part of workup for possible hiatal hernia repair  anxiety  Plan:    1. Hiatal hernia [K44.9]  R/b/a discussed. Risks include bleeding, perforation. Benefits include diagnostic value. Alternatives include continued observation. Pt verbalized understanding. Will proceed at [facility] due to comorbidities.   Pt complaints of SOB and chest pain sound chronic, with stable vital signs at visit this am. She explains how she feels her heart is about to jump out of her chest and has episodes where she cannot catch her breath. She is constantly worried about "dying".   I asked her if she feels safe to return home where she spends significant time alone. She adamantly refuses to go  elsewhere despite her concerns above. Recommend she return to Dr. Doy Hutching to see if any adjustments needs to be made for her  meds due to increased anxiety, while waiting to schedule surgery.

## 2021-07-02 ENCOUNTER — Inpatient Hospital Stay: Payer: PPO | Attending: Internal Medicine | Admitting: Internal Medicine

## 2021-07-02 ENCOUNTER — Telehealth: Payer: Self-pay | Admitting: *Deleted

## 2021-07-02 ENCOUNTER — Encounter: Payer: Self-pay | Admitting: Surgery

## 2021-07-02 ENCOUNTER — Other Ambulatory Visit: Payer: Self-pay | Admitting: *Deleted

## 2021-07-02 ENCOUNTER — Inpatient Hospital Stay: Payer: PPO

## 2021-07-02 VITALS — BP 151/63 | HR 88 | Temp 97.2°F | Resp 20 | Wt 102.3 lb

## 2021-07-02 DIAGNOSIS — Z8673 Personal history of transient ischemic attack (TIA), and cerebral infarction without residual deficits: Secondary | ICD-10-CM | POA: Insufficient documentation

## 2021-07-02 DIAGNOSIS — I10 Essential (primary) hypertension: Secondary | ICD-10-CM | POA: Diagnosis not present

## 2021-07-02 DIAGNOSIS — Z79899 Other long term (current) drug therapy: Secondary | ICD-10-CM | POA: Insufficient documentation

## 2021-07-02 DIAGNOSIS — D649 Anemia, unspecified: Secondary | ICD-10-CM

## 2021-07-02 DIAGNOSIS — E785 Hyperlipidemia, unspecified: Secondary | ICD-10-CM | POA: Diagnosis not present

## 2021-07-02 DIAGNOSIS — E119 Type 2 diabetes mellitus without complications: Secondary | ICD-10-CM | POA: Insufficient documentation

## 2021-07-02 DIAGNOSIS — R0600 Dyspnea, unspecified: Secondary | ICD-10-CM | POA: Insufficient documentation

## 2021-07-02 DIAGNOSIS — R5383 Other fatigue: Secondary | ICD-10-CM | POA: Insufficient documentation

## 2021-07-02 LAB — COMPREHENSIVE METABOLIC PANEL
ALT: 16 U/L (ref 0–44)
AST: 26 U/L (ref 15–41)
Albumin: 3.5 g/dL (ref 3.5–5.0)
Alkaline Phosphatase: 86 U/L (ref 38–126)
Anion gap: 8 (ref 5–15)
BUN: 10 mg/dL (ref 8–23)
CO2: 29 mmol/L (ref 22–32)
Calcium: 8.4 mg/dL — ABNORMAL LOW (ref 8.9–10.3)
Chloride: 98 mmol/L (ref 98–111)
Creatinine, Ser: 0.68 mg/dL (ref 0.44–1.00)
GFR, Estimated: >60 mL/min (ref >60)
Glucose, Bld: 153 mg/dL — ABNORMAL HIGH (ref 70–99)
Potassium: 2.6 mmol/L — CL (ref 3.5–5.1)
Sodium: 135 mmol/L (ref 135–145)
Total Bilirubin: 0.8 mg/dL (ref 0.3–1.2)
Total Protein: 6.1 g/dL — ABNORMAL LOW (ref 6.5–8.1)

## 2021-07-02 LAB — LACTATE DEHYDROGENASE: LDH: 181 U/L (ref 98–192)

## 2021-07-02 LAB — CBC WITH DIFFERENTIAL/PLATELET
Abs Immature Granulocytes: 0.08 10*3/uL — ABNORMAL HIGH (ref 0.00–0.07)
Basophils Absolute: 0 10*3/uL (ref 0.0–0.1)
Basophils Relative: 0 %
Eosinophils Absolute: 0 10*3/uL (ref 0.0–0.5)
Eosinophils Relative: 1 %
HCT: 24.5 % — ABNORMAL LOW (ref 36.0–46.0)
Hemoglobin: 6.9 g/dL — ABNORMAL LOW (ref 12.0–15.0)
Immature Granulocytes: 1 %
Lymphocytes Relative: 12 %
Lymphs Abs: 0.8 10*3/uL (ref 0.7–4.0)
MCH: 20.7 pg — ABNORMAL LOW (ref 26.0–34.0)
MCHC: 28.2 g/dL — ABNORMAL LOW (ref 30.0–36.0)
MCV: 73.6 fL — ABNORMAL LOW (ref 80.0–100.0)
Monocytes Absolute: 0.5 10*3/uL (ref 0.1–1.0)
Monocytes Relative: 8 %
Neutro Abs: 5 10*3/uL (ref 1.7–7.7)
Neutrophils Relative %: 78 %
Platelets: 372 10*3/uL (ref 150–400)
RBC: 3.33 MIL/uL — ABNORMAL LOW (ref 3.87–5.11)
RDW: 21.6 % — ABNORMAL HIGH (ref 11.5–15.5)
WBC: 6.4 10*3/uL (ref 4.0–10.5)
nRBC: 0 % (ref 0.0–0.2)

## 2021-07-02 LAB — RETICULOCYTES
Immature Retic Fract: 10.7 % (ref 2.3–15.9)
RBC.: 3.32 MIL/uL — ABNORMAL LOW (ref 3.87–5.11)
Retic Count, Absolute: 47.8 10*3/uL (ref 19.0–186.0)
Retic Ct Pct: 1.4 % (ref 0.4–3.1)

## 2021-07-02 LAB — IRON AND TIBC
Iron: 17 ug/dL — ABNORMAL LOW (ref 28–170)
Saturation Ratios: 4 % — ABNORMAL LOW (ref 10.4–31.8)
TIBC: 396 ug/dL (ref 250–450)
UIBC: 379 ug/dL

## 2021-07-02 LAB — TECHNOLOGIST SMEAR REVIEW: Plt Morphology: ADEQUATE

## 2021-07-02 LAB — FERRITIN: Ferritin: 14 ng/mL (ref 11–307)

## 2021-07-02 LAB — ABO/RH: ABO/RH(D): O POS

## 2021-07-02 MED ORDER — POTASSIUM CHLORIDE CRYS ER 20 MEQ PO TBCR: 20.0000 meq | EXTENDED_RELEASE_TABLET | Freq: Two times a day (BID) | ORAL | 0 refills | Status: DC

## 2021-07-02 NOTE — Progress Notes (Signed)
[facility] NOTE  Patient Care Team: Idelle Crouch, MD as PCP - General (Internal Medicine)  CHIEF COMPLAINTS/PURPOSE OF CONSULTATION: ANEMIA   HEMATOLOGY HISTORY:  # ANEMIA EGD-; colonoscopy- ; capsule-? Bone marrow Biopsy-  HISTORY OF PRESENTING ILLNESS: In a wheelchair.  Accompanied by/neighbor/friend.  Wanda Monroe 80 y.o.  female has been referred to [place] for further evaluation/work-up for anemia.  Patient has been recently evaluated by pulmonary for ongoing shortness of breath.  Subsequently referred to surgery for evaluation treatment of paraesophageal hernia.  Patient underwent EGD for further evaluation yesterday.  Patient is retired.  Shortness of breath on exertion.  Feels poorly.  Blood in stools: None Change in bowel habits- None Blood in urine: None Difficulty swallowing: yes- hiatal hernia Abnormal weight loss: yes Iron supplementation: none currently Prior Blood transfusions: none Bariatric surgery: None EGD: 10/ 20- Dr.Sakai. /Colonoscopy:>> may years ago.   Vaginal bleeding: None   Review of Systems  Constitutional:  Positive for malaise/fatigue and weight loss. Negative for chills, diaphoresis and fever.  HENT:  Negative for nosebleeds and sore throat.   Eyes:  Negative for double vision.  Respiratory:  Positive for shortness of breath. Negative for cough, hemoptysis, sputum production and wheezing.   Cardiovascular:  Negative for chest pain, palpitations, orthopnea and leg swelling.  Gastrointestinal:  Positive for nausea. Negative for abdominal pain, blood in stool, constipation, diarrhea, heartburn, melena and vomiting.  Genitourinary:  Negative for dysuria, frequency and urgency.  Musculoskeletal:  Positive for joint pain. Negative for back pain.  Skin: Negative.  Negative for itching and rash.  Neurological:  Negative for dizziness, tingling, focal weakness, weakness and headaches.  Endo/Heme/Allergies:  Does not  bruise/bleed easily.  Psychiatric/Behavioral:  Negative for depression. The patient is not nervous/anxious and does not have insomnia.    MEDICAL HISTORY:  Past Medical History:  Diagnosis Date   Anemia    Arthritis    rhuematoid,OSTEOARTHRITIS   Cellulitis    Diabetes mellitus without complication (HCC)    type II   GERD (gastroesophageal reflux disease)    Gout    Hiatal hernia    Hyperlipidemia    Hypertension    Neuromuscular disorder ([place])    left foot neuropathy   Osteoporosis    Rosacea    Spinal stenosis of lumbar region    Stroke ([place])    x2, 9/10   Temporal arteritis ([place])    Wears dentures    full top, partial bottom    SURGICAL HISTORY: Past Surgical History:  Procedure Laterality Date   ABDOMINAL HYSTERECTOMY     APPENDECTOMY     BACK SURGERY     lumbar and cervical laminectomies   BREAST BIOPSY Left 1960,1980   twice negative results   CAPSULOTOMY METATARSOPHALANGEAL Left 01/22/2015   Procedure: CAPSULOTOMY METATARSOPHALANGEAL;  Surgeon: Albertine Patricia, DPM;  Location: [place];  Service: Podiatry;  Laterality: Left;   CATARACT EXTRACTION W/PHACO Left 10/19/2016   Procedure: CATARACT EXTRACTION PHACO AND INTRAOCULAR LENS PLACEMENT (IOC)  Left Eye  Diabetic;  Surgeon: Leandrew Koyanagi, MD;  Location: [place];  Service: Ophthalmology;  Laterality: Left;  Diabetic - oral meds Left Eye   CATARACT EXTRACTION W/PHACO Right 11/16/2016   Procedure: CATARACT EXTRACTION PHACO AND INTRAOCULAR LENS PLACEMENT (IOC) dIABETIC;  Surgeon: Leandrew Koyanagi, MD;  Location: [place];  Service: Ophthalmology;  Laterality: Right;  DIABETES - oral meds   COLONOSCOPY WITH PROPOFOL N/A 04/24/2018   Procedure: COLONOSCOPY WITH PROPOFOL;  Surgeon: Toledo, Benay Pike, MD;  Location: [facility];  Service: Gastroenterology;  Laterality: N/A;   ESOPHAGOGASTRODUODENOSCOPY N/A 07/01/2021   Procedure: ESOPHAGOGASTRODUODENOSCOPY (EGD);  Surgeon: Benjamine Sprague, DO;  Location: [facility];  Service: General;  Laterality: N/A;   ESOPHAGOGASTRODUODENOSCOPY (EGD) WITH PROPOFOL N/A 04/24/2018   Procedure: ESOPHAGOGASTRODUODENOSCOPY (EGD) WITH PROPOFOL;  Surgeon: Toledo, Benay Pike, MD;  Location: [facility];  Service: Gastroenterology;  Laterality: N/A;   FOOT SURGERY     HALLUX VALGUS AKIN Left 01/22/2015   Procedure: HALLUX VALGUS AKIN;  Surgeon: Albertine Patricia, DPM;  Location: [place];  Service: Podiatry;  Laterality: Left;  left great toe   HEMORRHOID SURGERY     KNEE ARTHROSCOPY     LUMBAR LAMINECTOMY  04/23/13   NOSE SURGERY     TONSILLECTOMY     URETER SURGERY      SOCIAL HISTORY: Social History   Socioeconomic History   Marital status: Widowed    Spouse name: Not on file   Number of children: Not on file   Years of education: Not on file   Highest education level: Not on file  Occupational History   Not on file  Tobacco Use   Smoking status: Never   Smokeless tobacco: Never  Vaping Use   Vaping Use: Never used  Substance and Sexual Activity   Alcohol use: No   Drug use: Not Currently   Sexual activity: Not on file  Other Topics Concern   Not on file  Social History Narrative   Not on file   Social Determinants of Health   Financial Resource Strain: Not on file  Food Insecurity: Not on file  Transportation Needs: Not on file  Physical Activity: Not on file  Stress: Not on file  Social Connections: Not on file  Intimate Partner Violence: Not on file    FAMILY HISTORY: Family History  Problem Relation Age of Onset   Diabetes Father    Stroke Father    Breast cancer Neg Hx     ALLERGIES:  is allergic to codeine sulfate and remicade [infliximab].  MEDICATIONS:  No current facility-administered medications for this visit.   Current Outpatient Medications  Medication Sig Dispense Refill   potassium chloride SA (KLOR-CON) 20 MEQ tablet Take 1 tablet (20 mEq total) by mouth 2 (two) times  daily. 30 tablet 0   Facility-Administered Medications Ordered in Other Visits  Medication Dose Route Frequency Provider Last Rate Last Admin   0.9 %  sodium chloride infusion   Intravenous Continuous Sakai, Isami, DO 20 mL/hr at 07/01/21 1248 Restarted at 07/01/21 1324      PHYSICAL EXAMINATION:   Vitals:   07/02/21 1115  BP: (!) 151/63  Pulse: 88  Resp: 20  Temp: (!) 97.2 F (36.2 C)  SpO2: 99%   Filed Weights   07/02/21 1115  Weight: 102 lb 4.8 oz (46.4 kg)    Physical Exam Vitals and nursing note reviewed.  HENT:     Head: Normocephalic and atraumatic.     Mouth/Throat:     Pharynx: Oropharynx is clear.  Eyes:     Extraocular Movements: Extraocular movements intact.     Pupils: Pupils are equal, round, and reactive to light.  Cardiovascular:     Rate and Rhythm: Normal rate and regular rhythm.  Pulmonary:     Comments: Decreased breath sounds bilaterally.  Abdominal:     Palpations: Abdomen is soft.  Musculoskeletal:        General: Normal range of  motion.     Cervical back: Normal range of motion.  Skin:    General: Skin is warm.  Neurological:     General: No focal deficit present.     Mental Status: She is alert and oriented to person, place, and time.  Psychiatric:        Behavior: Behavior normal.        Judgment: Judgment normal.    LABORATORY DATA:  I have reviewed the data as listed Lab Results  Component Value Date   WBC 6.4 07/02/2021   HGB 6.9 (L) 07/02/2021   HCT 24.5 (L) 07/02/2021   MCV 73.6 (L) 07/02/2021   PLT 372 07/02/2021   Recent Labs    05/13/21 1525 07/02/21 1200  NA  --  135  K  --  2.6*  CL  --  98  CO2  --  29  GLUCOSE  --  153*  BUN  --  10  CREATININE 0.60 0.68  CALCIUM  --  8.4*  GFRNONAA  --  >60  PROT  --  6.1*  ALBUMIN  --  3.5  AST  --  26  ALT  --  16  ALKPHOS  --  86  BILITOT  --  0.8     DG ESOPHAGUS W DOUBLE CM (HD)  Result Date: 06/28/2021 CLINICAL DATA:  Hiatal hernia, preoperative EXAM:  ESOPHOGRAM / BARIUM SWALLOW / BARIUM TABLET STUDY TECHNIQUE: Combined double contrast and single contrast examination performed using effervescent crystals, thick barium liquid, and thin barium liquid. The patient was observed with fluoroscopy swallowing a 13 mm barium sulphate tablet. FLUOROSCOPY TIME:  Fluoroscopy Time:  3:30 COMPARISON:  None. FINDINGS: Normal oropharyngeal phase of swallow. No evidence of penetration or aspiration. Moderate hiatal hernia with intrathoracic position of the gastric fundus. Severe esophageal dysmotility with tertiary contractions observed throughout. Mild, spontaneous gastroesophageal reflux to the midesophagus. Normal partial double contrast appearance of the stomach and proximal small bowel. A swallowed 13 mm barium tablet passes readily through the gastroesophageal junction. IMPRESSION: 1. Moderate hiatal hernia with intrathoracic position of the gastric fundus. 2.  Severe esophageal dysmotility. 3.  Mild, spontaneous gastroesophageal reflux to the midesophagus. Electronically Signed   By: Delanna Ahmadi M.D.   On: 06/28/2021 09:39    Symptomatic anemia # Anemia- 7-8; microcytic with suspicious for iron deficiency.  However etiology is not clear- ?  Hiatal hernia versus others.  #Etiology: I discussed multiple etiologies with the patient including but not limited to malabsorption/blood loss/nutrition etc. less likely nutritional.  S/p recent EGD- ?-Hiatal hernia because of her anemia.  Await above  work-up at this time  #Discussed the possible need for blood transfusion-patient reluctant/transportation issues.  #Dyspnea/fatigue-likely secondary to underlying anemia; await improvement of plan as above.  # Patient is symptomatic from worsening fatigue-recommend IV iron infusions. Discussed the potential acute infusion reactions with IV iron; which are quite rare.  Patient understands the risk; will proceed with infusions.   Thank you, [facility] for allowing me to  participate in the care of your pleasant patient. Please do not hesitate to contact me with questions or concerns in the interim.  Sakai/pabon/Aleskerov  # DISPOSITION: # labs today # Venofer weekly x4- start next Friday # follow up in 4 weeks- NP; labs- cbc/possible venofer- Dr.B  Addendum: Potassium low at 2.9; K-Dur supplementation; recheck next visit-1 week; follow-up with NP/labs- in 2 weeks.     All questions were answered. The patient knows to call the clinic with any  problems, questions or concerns.      Wanda Sickle, MD 07/02/2021 4:57 PM

## 2021-07-02 NOTE — Progress Notes (Signed)
Pt reports new diagnosis of hiatal hernia causing dyspnea. Pt awaiting surgical consultation to resolve concerns. No other complaints at this time.

## 2021-07-02 NOTE — Assessment & Plan Note (Addendum)
#   Anemia- 7-8; microcytic with suspicious for iron deficiency.  However etiology is not clear- ?  Hiatal hernia versus others.  #Etiology: I discussed multiple etiologies with the patient including but not limited to malabsorption/blood loss/nutrition etc. less likely nutritional.  S/p recent EGD- ?-Hiatal hernia because of her anemia.  Await above  work-up at this time  #Discussed the possible need for blood transfusion-patient reluctant/transportation issues.  #Dyspnea/fatigue-likely secondary to underlying anemia; await improvement of plan as above.  # Patient is symptomatic from worsening fatigue-recommend IV iron infusions. Discussed the potential acute infusion reactions with IV iron; which are quite rare.  Patient understands the risk; will proceed with infusions.   Thank you, [facility] for allowing me to participate in the care of your pleasant patient. Please do not hesitate to contact me with questions or concerns in the interim.  Sakai/pabon/Aleskerov  # DISPOSITION: # labs today # Venofer weekly x4- start next Friday # follow up in 4 weeks- NP; labs- cbc/possible venofer- Dr.B  Addendum: Potassium low at 2.9; K-Dur supplementation; recheck next visit-1 week; follow-up with NP/labs- in 2 weeks.

## 2021-07-02 NOTE — Progress Notes (Signed)
Critical potassium of 2.6 sent to Dr. Rogue Bussing at [phone] by the [facility] lab.    Receiving incoming msg from Dr. Jacinto Reap "please call pt Kdur 20 meq BID # 30 pills; repeat bmp when she come for venofer in 1 week; please call the pt/ Foust,Rodney /Friend-[phone]; and have pt see NP in 2 weeks/lab- bmp;  keep appt with me as planned in 4 weeks"

## 2021-07-02 NOTE — Telephone Encounter (Signed)
Critical potassium of 2.6 sent to Dr. Rogue Bussing at [phone] by the cancer center lab.    Receiving incoming msg from Dr. Jacinto Reap "please call pt Kdur 20 meq BID # 30 pills; repeat bmp when she come for venofer in 1 week; please call the pt/ Foust,Rodney /[phone]; and have pt see NP in 2 weeks/lab- bmp;  keep appt with me as planned in 4 weeks"  I spoke to her friend Barbaraann Rondo-. Made aware that script has been sent to her pharmacy. Barbaraann Rondo stated that pt has potassium chloride 10 meq that she takes daily. He wanted to know if patient could use this prescription. I explained to caregiver that if patient uses the 10 meq potassium chloride script, then she would need to take 2 tablets of the potassium chloride twice daily. Caregiver read back the instructions. Pt/family aware that potassium will be rechecked again next week and the following week to ensure potassium improved.

## 2021-07-03 LAB — HAPTOGLOBIN: Haptoglobin: 151 mg/dL (ref 42–346)

## 2021-07-04 LAB — ERYTHROPOIETIN: Erythropoietin: 134.2 m[IU]/mL — ABNORMAL HIGH (ref 2.6–18.5)

## 2021-07-05 LAB — KAPPA/LAMBDA LIGHT CHAINS
Kappa free light chain: 15.3 mg/L (ref 3.3–19.4)
Kappa, lambda light chain ratio: 1.37 (ref 0.26–1.65)
Lambda free light chains: 11.2 mg/L (ref 5.7–26.3)

## 2021-07-06 LAB — IMMUNOFIXATION ELECTROPHORESIS
IgA: 61 mg/dL — ABNORMAL LOW (ref 64–422)
IgG (Immunoglobin G), Serum: 442 mg/dL — ABNORMAL LOW (ref 586–1602)
IgM (Immunoglobulin M), Srm: 89 mg/dL (ref 26–217)
Total Protein ELP: 5.3 g/dL — ABNORMAL LOW (ref 6.0–8.5)

## 2021-07-09 ENCOUNTER — Inpatient Hospital Stay: Payer: PPO

## 2021-07-09 ENCOUNTER — Other Ambulatory Visit: Payer: Self-pay

## 2021-07-09 VITALS — BP 150/83 | HR 78

## 2021-07-09 DIAGNOSIS — D649 Anemia, unspecified: Secondary | ICD-10-CM

## 2021-07-09 LAB — BASIC METABOLIC PANEL
Anion gap: 6 (ref 5–15)
BUN: 10 mg/dL (ref 8–23)
CO2: 27 mmol/L (ref 22–32)
Calcium: 8.7 mg/dL — ABNORMAL LOW (ref 8.9–10.3)
Chloride: 103 mmol/L (ref 98–111)
Creatinine, Ser: 0.6 mg/dL (ref 0.44–1.00)
GFR, Estimated: >60 mL/min (ref >60)
Glucose, Bld: 117 mg/dL — ABNORMAL HIGH (ref 70–99)
Potassium: 3.9 mmol/L (ref 3.5–5.1)
Sodium: 136 mmol/L (ref 135–145)

## 2021-07-09 MED ORDER — SODIUM CHLORIDE 0.9 % IV SOLN
Freq: Once | INTRAVENOUS | Status: AC
  Filled 2021-07-09: qty 250

## 2021-07-09 MED ORDER — SODIUM CHLORIDE 0.9 % IV SOLN: 200.0000 mg | Freq: Once | INTRAVENOUS | Status: DC

## 2021-07-09 MED ORDER — IRON SUCROSE 20 MG/ML IV SOLN
200.0000 mg | Freq: Once | INTRAVENOUS | Status: AC
  Administered 2021-07-09: 200 mg via INTRAVENOUS
  Filled 2021-07-09: qty 10

## 2021-07-09 NOTE — Patient Instructions (Signed)

## 2021-07-12 ENCOUNTER — Telehealth: Payer: Self-pay | Admitting: *Deleted

## 2021-07-12 ENCOUNTER — Ambulatory Visit: Payer: PPO | Admitting: Surgery

## 2021-07-12 DIAGNOSIS — E876 Hypokalemia: Secondary | ICD-10-CM

## 2021-07-12 MED ORDER — POTASSIUM CHLORIDE CRYS ER 20 MEQ PO TBCR: 20.0000 meq | EXTENDED_RELEASE_TABLET | Freq: Two times a day (BID) | ORAL | 3 refills | Status: DC

## 2021-07-12 NOTE — Telephone Encounter (Signed)
-----   Message from Cammie Sickle, MD sent at 07/10/2021  9:40 AM EDT ----- Please H/Z- please inform that her Potassium is improved. Continue to take KCL as ordered. GB

## 2021-07-12 NOTE — Telephone Encounter (Signed)
Contacted patient- Instructed pt to continue taking potassium. She had been taking 10 meq potassium four times day. She does not have anymore of these tablets. Pt prefers to take the 20 meq dosing twice daily instead. New script sent to pt's pharmacy with this dose.

## 2021-07-16 ENCOUNTER — Inpatient Hospital Stay: Payer: PPO | Attending: Nurse Practitioner

## 2021-07-16 ENCOUNTER — Inpatient Hospital Stay: Payer: PPO

## 2021-07-16 ENCOUNTER — Inpatient Hospital Stay (HOSPITAL_BASED_OUTPATIENT_CLINIC_OR_DEPARTMENT_OTHER): Payer: PPO | Admitting: Nurse Practitioner

## 2021-07-16 ENCOUNTER — Other Ambulatory Visit: Payer: Self-pay

## 2021-07-16 VITALS — BP 157/59 | HR 72 | Resp 18

## 2021-07-16 VITALS — BP 160/57 | HR 82 | Temp 97.7°F | Resp 17 | Wt 98.0 lb

## 2021-07-16 DIAGNOSIS — E876 Hypokalemia: Secondary | ICD-10-CM | POA: Insufficient documentation

## 2021-07-16 DIAGNOSIS — D649 Anemia, unspecified: Secondary | ICD-10-CM | POA: Diagnosis not present

## 2021-07-16 DIAGNOSIS — Z79899 Other long term (current) drug therapy: Secondary | ICD-10-CM | POA: Insufficient documentation

## 2021-07-16 LAB — BASIC METABOLIC PANEL
Anion gap: 10 (ref 5–15)
BUN: 13 mg/dL (ref 8–23)
CO2: 26 mmol/L (ref 22–32)
Calcium: 9.2 mg/dL (ref 8.9–10.3)
Chloride: 101 mmol/L (ref 98–111)
Creatinine, Ser: 0.78 mg/dL (ref 0.44–1.00)
GFR, Estimated: >60 mL/min (ref >60)
Glucose, Bld: 137 mg/dL — ABNORMAL HIGH (ref 70–99)
Potassium: 4 mmol/L (ref 3.5–5.1)
Sodium: 137 mmol/L (ref 135–145)

## 2021-07-16 MED ORDER — IRON SUCROSE 20 MG/ML IV SOLN
200.0000 mg | Freq: Once | INTRAVENOUS | Status: AC
  Administered 2021-07-16: 200 mg via INTRAVENOUS
  Filled 2021-07-16: qty 10

## 2021-07-16 MED ORDER — SODIUM CHLORIDE 0.9 % IV SOLN: 200.0000 mg | Freq: Once | INTRAVENOUS | Status: DC

## 2021-07-16 MED ORDER — SODIUM CHLORIDE 0.9 % IV SOLN
Freq: Once | INTRAVENOUS | Status: AC
Start: 2021-07-16 — End: 2021-07-16
  Filled 2021-07-16: qty 250

## 2021-07-16 NOTE — Progress Notes (Signed)
[facility] Progress Note  Patient Care Team: Idelle Crouch, MD as PCP - General (Internal Medicine)  CHIEF COMPLAINTS/PURPOSE OF CONSULTATION: ANEMIA   HEMATOLOGY HISTORY:  # ANEMIA EGD-; colonoscopy- ; capsule-? Bone marrow Biopsy-  HISTORY OF PRESENTING ILLNESS: In a wheelchair.  Accompanied by/neighbor/friend. Patient has been recently evaluated by pulmonary for ongoing shortness of breath.  Subsequently referred to surgery for evaluation treatment of paraesophageal hernia.  Patient underwent EGD for further evaluation yesterday.  Patient is retired.  Shortness of breath on exertion.  Feels poorly.  Blood in stools: None Change in bowel habits- None Blood in urine: None Difficulty swallowing: yes- hiatal hernia Abnormal weight loss: yes Iron supplementation: none currently Prior Blood transfusions: none Bariatric surgery: None EGD: 10/ 20- Dr.Sakai. /Colonoscopy:>> may years ago.   Vaginal bleeding: None   Interval History:  Wanda Monroe 80 y.o.  female patient who returns to clinic for labs and follow up for history of hypokalemia.  She has been taking her oral potassium as prescribed.  Otherwise feels well.  Tolerated iron infusion well without significant side effects.    Review of Systems  Constitutional:  Positive for malaise/fatigue. Negative for chills, diaphoresis, fever and weight loss.  HENT:  Negative for nosebleeds and sore throat.   Eyes:  Negative for double vision.  Respiratory:  Positive for shortness of breath. Negative for cough, hemoptysis, sputum production and wheezing.   Cardiovascular:  Negative for chest pain, palpitations, orthopnea and leg swelling.  Gastrointestinal:  Negative for abdominal pain, blood in stool, constipation, diarrhea, heartburn, melena, nausea and vomiting.  Genitourinary:  Negative for dysuria, frequency and urgency.  Musculoskeletal:  Negative for back pain and joint pain.  Skin: Negative.  Negative for  itching and rash.  Neurological:  Negative for dizziness, tingling, focal weakness, weakness and headaches.  Endo/Heme/Allergies:  Does not bruise/bleed easily.  Psychiatric/Behavioral:  Negative for depression. The patient is not nervous/anxious and does not have insomnia.    MEDICAL HISTORY:  Past Medical History:  Diagnosis Date   Anemia    Arthritis    rhuematoid,OSTEOARTHRITIS   Cellulitis    Diabetes mellitus without complication (HCC)    type II   GERD (gastroesophageal reflux disease)    Gout    Hiatal hernia    Hyperlipidemia    Hypertension    Neuromuscular disorder ([place])    left foot neuropathy   Osteoporosis    Rosacea    Spinal stenosis of lumbar region    Stroke ([place])    x2, 9/10   Temporal arteritis ([place])    Wears dentures    full top, partial bottom    SURGICAL HISTORY: Past Surgical History:  Procedure Laterality Date   ABDOMINAL HYSTERECTOMY     APPENDECTOMY     BACK SURGERY     lumbar and cervical laminectomies   BREAST BIOPSY Left 1960,1980   twice negative results   CAPSULOTOMY METATARSOPHALANGEAL Left 01/22/2015   Procedure: CAPSULOTOMY METATARSOPHALANGEAL;  Surgeon: Albertine Patricia, DPM;  Location: [facility];  Service: Podiatry;  Laterality: Left;   CATARACT EXTRACTION W/PHACO Left 10/19/2016   Procedure: CATARACT EXTRACTION PHACO AND INTRAOCULAR LENS PLACEMENT (IOC)  Left Eye  Diabetic;  Surgeon: Leandrew Koyanagi, MD;  Location: [facility];  Service: Ophthalmology;  Laterality: Left;  Diabetic - oral meds Left Eye   CATARACT EXTRACTION W/PHACO Right 11/16/2016   Procedure: CATARACT EXTRACTION PHACO AND INTRAOCULAR LENS PLACEMENT (IOC) dIABETIC;  Surgeon: Leandrew Koyanagi, MD;  Location: [facility];  Service: Ophthalmology;  Laterality: Right;  DIABETES - oral meds   COLONOSCOPY WITH PROPOFOL N/A 04/24/2018   Procedure: COLONOSCOPY WITH PROPOFOL;  Surgeon: Toledo, Benay Pike, MD;  Location: [facility];  Service:  Gastroenterology;  Laterality: N/A;   ESOPHAGOGASTRODUODENOSCOPY N/A 07/01/2021   Procedure: ESOPHAGOGASTRODUODENOSCOPY (EGD);  Surgeon: Benjamine Sprague, DO;  Location: [facility];  Service: General;  Laterality: N/A;   ESOPHAGOGASTRODUODENOSCOPY (EGD) WITH PROPOFOL N/A 04/24/2018   Procedure: ESOPHAGOGASTRODUODENOSCOPY (EGD) WITH PROPOFOL;  Surgeon: Toledo, Benay Pike, MD;  Location: [facility];  Service: Gastroenterology;  Laterality: N/A;   FOOT SURGERY     HALLUX VALGUS AKIN Left 01/22/2015   Procedure: HALLUX VALGUS AKIN;  Surgeon: Albertine Patricia, DPM;  Location: [facility];  Service: Podiatry;  Laterality: Left;  left great toe   HEMORRHOID SURGERY     KNEE ARTHROSCOPY     LUMBAR LAMINECTOMY  04/23/13   NOSE SURGERY     TONSILLECTOMY     URETER SURGERY      SOCIAL HISTORY: Social History   Socioeconomic History   Marital status: Widowed    Spouse name: Not on file   Number of children: Not on file   Years of education: Not on file   Highest education level: Not on file  Occupational History   Not on file  Tobacco Use   Smoking status: Never   Smokeless tobacco: Never  Vaping Use   Vaping Use: Never used  Substance and Sexual Activity   Alcohol use: No   Drug use: Not Currently   Sexual activity: Not on file  Other Topics Concern   Not on file  Social History Narrative   Not on file   Social Determinants of Health   Financial Resource Strain: Not on file  Food Insecurity: Not on file  Transportation Needs: Not on file  Physical Activity: Not on file  Stress: Not on file  Social Connections: Not on file  Intimate Partner Violence: Not on file    FAMILY HISTORY: Family History  Problem Relation Age of Onset   Diabetes Father    Stroke Father    Breast cancer Neg Hx     ALLERGIES:  is allergic to codeine sulfate and remicade [infliximab].  MEDICATIONS:  Current Outpatient Medications  Medication Sig Dispense Refill   alendronate (FOSAMAX)  70 MG tablet Take 70 mg by mouth once a week. Take with a full glass of water on an empty stomach.     aspirin 81 MG tablet Take 81 mg by mouth daily. AM     atorvastatin (LIPITOR) 80 MG tablet Take 80 mg by mouth daily. PM     calcium citrate-vitamin D (CITRACAL+D) 315-200 MG-UNIT per tablet Take 1 tablet by mouth daily. AM     Cholecalciferol (VITAMIN D-3 PO) Take 1,000 mg by mouth daily. AM     DULoxetine (CYMBALTA) 30 MG capsule Take 30 mg by mouth daily.     folic acid (FOLVITE) 161 MCG tablet Take 400 mcg by mouth daily. AM     gabapentin (NEURONTIN) 100 MG capsule Take 100 mg by mouth 3 (three) times daily.     latanoprost (XALATAN) 0.005 % ophthalmic solution 1 drop at bedtime.     metFORMIN (GLUCOPHAGE) 500 MG tablet Take 500 mg by mouth daily. AM     methotrexate (RHEUMATREX) 2.5 MG tablet Take 2.5 mg by mouth once a week. 4 tabs every  Wednesday     potassium chloride SA (KLOR-CON) 20 MEQ  tablet Take 1 tablet (20 mEq total) by mouth 2 (two) times daily. 30 tablet 3   sertraline (ZOLOFT) 50 MG tablet Take 50 mg by mouth daily. PM     traZODone (DESYREL) 50 MG tablet Take 50 mg by mouth at bedtime as needed.     acetaminophen (TYLENOL) 500 MG tablet Take 1,000 mg by mouth at bedtime. (Patient not taking: Reported on 07/16/2021)     acyclovir ointment (ZOVIRAX) 5 % Apply 1 application topically every 3 (three) hours. (Patient not taking: No sig reported)     omeprazole (PRILOSEC) 20 MG capsule Take 20 mg by mouth daily. AM (Patient not taking: Reported on 07/16/2021)     prednisoLONE 5 MG TABS tablet Take 5 mg by mouth every other day.     No current facility-administered medications for this visit.    PHYSICAL EXAMINATION:   Vitals:   07/16/21 1318  BP: (!) 160/57  Pulse: 82  Resp: 17  Temp: 97.7 F (36.5 C)  SpO2: 100%   Filed Weights   07/16/21 1318  Weight: 98 lb (44.5 kg)    Physical Exam Constitutional:      Appearance: She is not ill-appearing.     Comments:  Thin build. accompanied  Eyes:     General: No scleral icterus.    Conjunctiva/sclera: Conjunctivae normal.  Cardiovascular:     Rate and Rhythm: Normal rate and regular rhythm.  Abdominal:     General: There is no distension.     Palpations: Abdomen is soft.     Tenderness: There is no abdominal tenderness. There is no guarding.  Musculoskeletal:        General: No deformity.     Right lower leg: No edema.     Left lower leg: No edema.  Lymphadenopathy:     Cervical: No cervical adenopathy.  Skin:    General: Skin is warm and dry.  Neurological:     Mental Status: She is alert and oriented to person, place, and time. Mental status is at baseline.  Psychiatric:        Mood and Affect: Mood normal.        Behavior: Behavior normal.    LABORATORY DATA:  I have reviewed the data as listed Lab Results  Component Value Date   WBC 6.4 07/02/2021   HGB 6.9 (L) 07/02/2021   HCT 24.5 (L) 07/02/2021   MCV 73.6 (L) 07/02/2021   PLT 372 07/02/2021   Recent Labs    07/02/21 1200 07/09/21 1112 07/16/21 1251  NA 135 136 137  K 2.6* 3.9 4.0  CL 98 103 101  CO2 '29 27 26  ' GLUCOSE 153* 117* 137*  BUN '10 10 13  ' CREATININE 0.68 0.60 0.78  CALCIUM 8.4* 8.7* 9.2  GFRNONAA >60 >60 >60  PROT 6.1*  --   --   ALBUMIN 3.5  --   --   AST 26  --   --   ALT 16  --   --   ALKPHOS 86  --   --   BILITOT 0.8  --   --    Iron/TIBC/Ferritin/ %Sat    Component Value Date/Time   IRON 17 (L) 07/02/2021 1200   TIBC 396 07/02/2021 1200   FERRITIN 14 07/02/2021 1200   IRONPCTSAT 4 (L) 07/02/2021 1200    DG ESOPHAGUS W DOUBLE CM (HD)  Result Date: 06/28/2021 CLINICAL DATA:  Hiatal hernia, preoperative EXAM: ESOPHOGRAM / BARIUM SWALLOW / BARIUM TABLET STUDY TECHNIQUE: Combined double  contrast and single contrast examination performed using effervescent crystals, thick barium liquid, and thin barium liquid. The patient was observed with fluoroscopy swallowing a 13 mm barium sulphate tablet.  FLUOROSCOPY TIME:  Fluoroscopy Time:  3:30 COMPARISON:  None. FINDINGS: Normal oropharyngeal phase of swallow. No evidence of penetration or aspiration. Moderate hiatal hernia with intrathoracic position of the gastric fundus. Severe esophageal dysmotility with tertiary contractions observed throughout. Mild, spontaneous gastroesophageal reflux to the midesophagus. Normal partial double contrast appearance of the stomach and proximal small bowel. A swallowed 13 mm barium tablet passes readily through the gastroesophageal junction. IMPRESSION: 1. Moderate hiatal hernia with intrathoracic position of the gastric fundus. 2.  Severe esophageal dysmotility. 3.  Mild, spontaneous gastroesophageal reflux to the midesophagus. Electronically Signed   By: Delanna Ahmadi M.D.   On: 06/28/2021 09:39     Assessment & Plan:  No problem-specific Assessment & Plan notes found for this encounter.  Hypokalemia- potassium 20 eq BID refilled on 07/12/21. Continue oral potassium. Has improved and now normalized.  Anemia- s/p venofer x 1. Tolerated well. Monitor  Follow up as scheduled.  Return to clinic if symptoms do not improve or worsen in the interim.  All questions were answered. The patient knows to call the clinic with any problems, questions or concerns.   Verlon Au, NP 07/16/2021

## 2021-07-16 NOTE — Progress Notes (Signed)
Pt tolerated venofer infusion well with no problems or complaints.  Pt left the infusion suite stable in a wheelchair.

## 2021-07-16 NOTE — Patient Instructions (Signed)

## 2021-07-16 NOTE — Progress Notes (Signed)
Patient here for oncology follow-up appointment, concerns  of back pain and SOB

## 2021-07-19 DIAGNOSIS — M1990 Unspecified osteoarthritis, unspecified site: Secondary | ICD-10-CM | POA: Diagnosis not present

## 2021-07-19 DIAGNOSIS — D649 Anemia, unspecified: Secondary | ICD-10-CM | POA: Diagnosis not present

## 2021-07-19 DIAGNOSIS — M8580 Other specified disorders of bone density and structure, unspecified site: Secondary | ICD-10-CM | POA: Diagnosis not present

## 2021-07-19 DIAGNOSIS — Z9181 History of falling: Secondary | ICD-10-CM | POA: Diagnosis not present

## 2021-07-20 DIAGNOSIS — K635 Polyp of colon: Secondary | ICD-10-CM | POA: Diagnosis not present

## 2021-07-20 DIAGNOSIS — E785 Hyperlipidemia, unspecified: Secondary | ICD-10-CM | POA: Diagnosis not present

## 2021-07-20 DIAGNOSIS — I10 Essential (primary) hypertension: Secondary | ICD-10-CM | POA: Diagnosis not present

## 2021-07-20 DIAGNOSIS — Z8673 Personal history of transient ischemic attack (TIA), and cerebral infarction without residual deficits: Secondary | ICD-10-CM | POA: Diagnosis not present

## 2021-07-20 DIAGNOSIS — I361 Nonrheumatic tricuspid (valve) insufficiency: Secondary | ICD-10-CM | POA: Diagnosis not present

## 2021-07-20 DIAGNOSIS — M0579 Rheumatoid arthritis with rheumatoid factor of multiple sites without organ or systems involvement: Secondary | ICD-10-CM | POA: Diagnosis not present

## 2021-07-20 DIAGNOSIS — M48061 Spinal stenosis, lumbar region without neurogenic claudication: Secondary | ICD-10-CM | POA: Diagnosis not present

## 2021-07-20 DIAGNOSIS — M810 Age-related osteoporosis without current pathological fracture: Secondary | ICD-10-CM | POA: Diagnosis not present

## 2021-07-20 DIAGNOSIS — K219 Gastro-esophageal reflux disease without esophagitis: Secondary | ICD-10-CM | POA: Diagnosis not present

## 2021-07-20 DIAGNOSIS — L719 Rosacea, unspecified: Secondary | ICD-10-CM | POA: Diagnosis not present

## 2021-07-20 DIAGNOSIS — D649 Anemia, unspecified: Secondary | ICD-10-CM | POA: Diagnosis not present

## 2021-07-20 DIAGNOSIS — E118 Type 2 diabetes mellitus with unspecified complications: Secondary | ICD-10-CM | POA: Diagnosis not present

## 2021-07-22 ENCOUNTER — Other Ambulatory Visit: Payer: Self-pay | Admitting: *Deleted

## 2021-07-22 DIAGNOSIS — D649 Anemia, unspecified: Secondary | ICD-10-CM

## 2021-07-22 DIAGNOSIS — E876 Hypokalemia: Secondary | ICD-10-CM

## 2021-07-23 ENCOUNTER — Other Ambulatory Visit: Payer: Self-pay

## 2021-07-23 ENCOUNTER — Inpatient Hospital Stay: Payer: PPO

## 2021-07-23 VITALS — BP 151/68 | HR 79 | Temp 97.1°F | Resp 18 | Wt 96.4 lb

## 2021-07-23 DIAGNOSIS — D649 Anemia, unspecified: Secondary | ICD-10-CM

## 2021-07-23 MED ORDER — SODIUM CHLORIDE 0.9 % IV SOLN: 200.0000 mg | Freq: Once | INTRAVENOUS | Status: DC

## 2021-07-23 MED ORDER — SODIUM CHLORIDE 0.9 % IV SOLN
Freq: Once | INTRAVENOUS | Status: AC
  Filled 2021-07-23: qty 250

## 2021-07-23 MED ORDER — IRON SUCROSE 20 MG/ML IV SOLN
200.0000 mg | Freq: Once | INTRAVENOUS | Status: AC
  Administered 2021-07-23: 200 mg via INTRAVENOUS
  Filled 2021-07-23: qty 10

## 2021-07-23 NOTE — Patient Instructions (Signed)

## 2021-07-26 ENCOUNTER — Ambulatory Visit: Payer: PPO | Admitting: Surgery

## 2021-07-27 DIAGNOSIS — M48061 Spinal stenosis, lumbar region without neurogenic claudication: Secondary | ICD-10-CM | POA: Diagnosis not present

## 2021-07-27 DIAGNOSIS — J00 Acute nasopharyngitis [common cold]: Secondary | ICD-10-CM | POA: Diagnosis not present

## 2021-07-27 DIAGNOSIS — M810 Age-related osteoporosis without current pathological fracture: Secondary | ICD-10-CM | POA: Diagnosis not present

## 2021-07-27 DIAGNOSIS — D649 Anemia, unspecified: Secondary | ICD-10-CM | POA: Diagnosis not present

## 2021-07-30 ENCOUNTER — Inpatient Hospital Stay (HOSPITAL_BASED_OUTPATIENT_CLINIC_OR_DEPARTMENT_OTHER): Payer: PPO | Admitting: Nurse Practitioner

## 2021-07-30 ENCOUNTER — Inpatient Hospital Stay: Payer: PPO

## 2021-07-30 ENCOUNTER — Other Ambulatory Visit: Payer: Self-pay

## 2021-07-30 ENCOUNTER — Telehealth: Payer: Self-pay | Admitting: Emergency Medicine

## 2021-07-30 VITALS — BP 139/76 | HR 77 | Temp 97.2°F | Resp 16 | Wt 97.0 lb

## 2021-07-30 VITALS — BP 126/56 | HR 75 | Temp 96.4°F | Resp 18

## 2021-07-30 DIAGNOSIS — D649 Anemia, unspecified: Secondary | ICD-10-CM | POA: Diagnosis not present

## 2021-07-30 DIAGNOSIS — R296 Repeated falls: Secondary | ICD-10-CM

## 2021-07-30 DIAGNOSIS — D509 Iron deficiency anemia, unspecified: Secondary | ICD-10-CM

## 2021-07-30 DIAGNOSIS — E876 Hypokalemia: Secondary | ICD-10-CM

## 2021-07-30 LAB — BASIC METABOLIC PANEL
Anion gap: 11 (ref 5–15)
BUN: 23 mg/dL (ref 8–23)
CO2: 28 mmol/L (ref 22–32)
Calcium: 9.2 mg/dL (ref 8.9–10.3)
Chloride: 93 mmol/L — ABNORMAL LOW (ref 98–111)
Creatinine, Ser: 0.73 mg/dL (ref 0.44–1.00)
GFR, Estimated: >60 mL/min (ref >60)
Glucose, Bld: 122 mg/dL — ABNORMAL HIGH (ref 70–99)
Potassium: 4.3 mmol/L (ref 3.5–5.1)
Sodium: 132 mmol/L — ABNORMAL LOW (ref 135–145)

## 2021-07-30 LAB — CBC WITH DIFFERENTIAL/PLATELET
Abs Immature Granulocytes: 0.01 10*3/uL (ref 0.00–0.07)
Basophils Absolute: 0 10*3/uL (ref 0.0–0.1)
Basophils Relative: 0 %
Eosinophils Absolute: 0 10*3/uL (ref 0.0–0.5)
Eosinophils Relative: 1 %
HCT: 29 % — ABNORMAL LOW (ref 36.0–46.0)
Hemoglobin: 8.5 g/dL — ABNORMAL LOW (ref 12.0–15.0)
Immature Granulocytes: 0 %
Lymphocytes Relative: 18 %
Lymphs Abs: 0.7 10*3/uL (ref 0.7–4.0)
MCH: 23.7 pg — ABNORMAL LOW (ref 26.0–34.0)
MCHC: 29.3 g/dL — ABNORMAL LOW (ref 30.0–36.0)
MCV: 80.8 fL (ref 80.0–100.0)
Monocytes Absolute: 0.4 10*3/uL (ref 0.1–1.0)
Monocytes Relative: 11 %
Neutro Abs: 2.8 10*3/uL (ref 1.7–7.7)
Neutrophils Relative %: 70 %
Platelets: 284 10*3/uL (ref 150–400)
RBC: 3.59 MIL/uL — ABNORMAL LOW (ref 3.87–5.11)
RDW: 27.7 % — ABNORMAL HIGH (ref 11.5–15.5)
WBC: 3.9 10*3/uL — ABNORMAL LOW (ref 4.0–10.5)
nRBC: 0 % (ref 0.0–0.2)

## 2021-07-30 MED ORDER — SODIUM CHLORIDE 0.9 % IV SOLN
Freq: Once | INTRAVENOUS | Status: AC
Start: 2021-07-30 — End: 2021-07-30
  Filled 2021-07-30: qty 250

## 2021-07-30 MED ORDER — IRON SUCROSE 20 MG/ML IV SOLN
200.0000 mg | Freq: Once | INTRAVENOUS | Status: AC
  Administered 2021-07-30: 200 mg via INTRAVENOUS
  Filled 2021-07-30: qty 10

## 2021-07-30 MED ORDER — SODIUM CHLORIDE 0.9 % IV SOLN: 200.0000 mg | Freq: Once | INTRAVENOUS | Status: DC

## 2021-07-30 NOTE — Patient Instructions (Signed)

## 2021-07-30 NOTE — Progress Notes (Signed)
Pt reports multiple falls in the past few weeks d/t loss of balance. Denies LOC or injury.

## 2021-07-30 NOTE — Telephone Encounter (Signed)
Spoke with PCP Fulton Reek, MD by secure chat per Beckey Rutter, NP, regarding multiple recent falls. Dr. Doy Hutching replied that he will try to get pt into his office for evaluation

## 2021-07-30 NOTE — Progress Notes (Signed)
[facility] Progress Note  Patient Care Team: Idelle Crouch, MD as PCP - General (Internal Medicine)  CHIEF COMPLAINTS/PURPOSE OF CONSULTATION: ANEMIA  HEMATOLOGY HISTORY:  # ANEMIA EGD-; colonoscopy- ; capsule-? Bone marrow Biopsy-  HISTORY OF PRESENTING ILLNESS: In a wheelchair.  Accompanied by/neighbor/friend. Patient has been recently evaluated by pulmonary for ongoing shortness of breath.  Subsequently referred to surgery for evaluation treatment of paraesophageal hernia.  Patient underwent EGD for further evaluation yesterday.  Patient is retired.  Shortness of breath on exertion.  Feels poorly.  Blood in stools: None Change in bowel habits- None Blood in urine: None Difficulty swallowing: yes- hiatal hernia Abnormal weight loss: yes Iron supplementation: none currently Prior Blood transfusions: none Bariatric surgery: None EGD: 10/ 20- Dr.Sakai. /Colonoscopy:>> may years ago.   Vaginal bleeding: None   Interval History:  Wanda Monroe 80 y.o.  female who returns to clinic for labs and consideration of IV iron.  She continues to take oral potassium.  She feels well.  She has been tolerating iron infusions previously without significant side effects.  Reports fatigue, weakness, and falls. Has fallen once this summer. Twice yesterday. Once she slipped on wet grass and fell. Had to call neighbor to come help her up. Lost her balance for second fall, indoors, and was able to get up on her own. Denies injury. She has continued loss of appetite related to hiatal hernia. Barbaraann Rondo, her neighbor, accompanies her for visit today.    Review of Systems  Constitutional:  Positive for malaise/fatigue. Negative for chills, diaphoresis, fever and weight loss.  HENT:  Negative for nosebleeds and sore throat.   Eyes:  Negative for double vision.  Respiratory:  Positive for shortness of breath. Negative for cough, hemoptysis, sputum production and wheezing.   Cardiovascular:   Negative for chest pain, palpitations, orthopnea and leg swelling.  Gastrointestinal:  Negative for abdominal pain, blood in stool, constipation, diarrhea, heartburn, melena, nausea and vomiting.  Genitourinary:  Negative for dysuria, frequency and urgency.  Musculoskeletal:  Negative for back pain and joint pain.  Skin: Negative.  Negative for itching and rash.  Neurological:  Negative for dizziness, tingling, focal weakness, weakness and headaches.  Endo/Heme/Allergies:  Does not bruise/bleed easily.  Psychiatric/Behavioral:  Negative for depression. The patient is not nervous/anxious and does not have insomnia.    MEDICAL HISTORY:  Past Medical History:  Diagnosis Date   Anemia    Arthritis    rhuematoid,OSTEOARTHRITIS   Cellulitis    Diabetes mellitus without complication (HCC)    type II   GERD (gastroesophageal reflux disease)    Gout    Hiatal hernia    Hyperlipidemia    Hypertension    Neuromuscular disorder ([place])    left foot neuropathy   Osteoporosis    Rosacea    Spinal stenosis of lumbar region    Stroke ([place])    x2, 9/10   Temporal arteritis ([place])    Wears dentures    full top, partial bottom    SURGICAL HISTORY: Past Surgical History:  Procedure Laterality Date   ABDOMINAL HYSTERECTOMY     APPENDECTOMY     BACK SURGERY     lumbar and cervical laminectomies   BREAST BIOPSY Left 1960,1980   twice negative results   CAPSULOTOMY METATARSOPHALANGEAL Left 01/22/2015   Procedure: CAPSULOTOMY METATARSOPHALANGEAL;  Surgeon: Albertine Patricia, DPM;  Location: [facility];  Service: Podiatry;  Laterality: Left;   CATARACT EXTRACTION W/PHACO Left 10/19/2016   Procedure: CATARACT  EXTRACTION PHACO AND INTRAOCULAR LENS PLACEMENT (IOC)  Left Eye  Diabetic;  Surgeon: Leandrew Koyanagi, MD;  Location: [facility];  Service: Ophthalmology;  Laterality: Left;  Diabetic - oral meds Left Eye   CATARACT EXTRACTION W/PHACO Right 11/16/2016   Procedure: CATARACT  EXTRACTION PHACO AND INTRAOCULAR LENS PLACEMENT (IOC) dIABETIC;  Surgeon: Leandrew Koyanagi, MD;  Location: [facility];  Service: Ophthalmology;  Laterality: Right;  DIABETES - oral meds   COLONOSCOPY WITH PROPOFOL N/A 04/24/2018   Procedure: COLONOSCOPY WITH PROPOFOL;  Surgeon: Toledo, Benay Pike, MD;  Location: [facility];  Service: Gastroenterology;  Laterality: N/A;   ESOPHAGOGASTRODUODENOSCOPY N/A 07/01/2021   Procedure: ESOPHAGOGASTRODUODENOSCOPY (EGD);  Surgeon: Benjamine Sprague, DO;  Location: [facility];  Service: General;  Laterality: N/A;   ESOPHAGOGASTRODUODENOSCOPY (EGD) WITH PROPOFOL N/A 04/24/2018   Procedure: ESOPHAGOGASTRODUODENOSCOPY (EGD) WITH PROPOFOL;  Surgeon: Toledo, Benay Pike, MD;  Location: [facility];  Service: Gastroenterology;  Laterality: N/A;   FOOT SURGERY     HALLUX VALGUS AKIN Left 01/22/2015   Procedure: HALLUX VALGUS AKIN;  Surgeon: Albertine Patricia, DPM;  Location: [place];  Service: Podiatry;  Laterality: Left;  left great toe   HEMORRHOID SURGERY     KNEE ARTHROSCOPY     LUMBAR LAMINECTOMY  04/23/13   NOSE SURGERY     TONSILLECTOMY     URETER SURGERY      SOCIAL HISTORY: Social History   Socioeconomic History   Marital status: Widowed    Spouse name: Not on file   Number of children: Not on file   Years of education: Not on file   Highest education level: Not on file  Occupational History   Not on file  Tobacco Use   Smoking status: Never   Smokeless tobacco: Never  Vaping Use   Vaping Use: Never used  Substance and Sexual Activity   Alcohol use: No   Drug use: Not Currently   Sexual activity: Not on file  Other Topics Concern   Not on file  Social History Narrative   Not on file   Social Determinants of Health   Financial Resource Strain: Not on file  Food Insecurity: Not on file  Transportation Needs: Not on file  Physical Activity: Not on file  Stress: Not on file  Social Connections: Not on file   Intimate Partner Violence: Not on file    FAMILY HISTORY: Family History  Problem Relation Age of Onset   Diabetes Father    Stroke Father    Breast cancer Neg Hx     ALLERGIES:  is allergic to codeine sulfate and remicade [infliximab].  MEDICATIONS:  Current Outpatient Medications  Medication Sig Dispense Refill   alendronate (FOSAMAX) 70 MG tablet Take 70 mg by mouth once a week. Take with a full glass of water on an empty stomach.     aspirin 81 MG tablet Take 81 mg by mouth daily. AM     atorvastatin (LIPITOR) 80 MG tablet Take 80 mg by mouth daily. PM     calcium citrate-vitamin D (CITRACAL+D) 315-200 MG-UNIT per tablet Take 1 tablet by mouth daily. AM     Cholecalciferol (VITAMIN D-3 PO) Take 1,000 mg by mouth daily. AM     DULoxetine (CYMBALTA) 30 MG capsule Take 30 mg by mouth daily.     folic acid (FOLVITE) 638 MCG tablet Take 400 mcg by mouth daily. AM     gabapentin (NEURONTIN) 100 MG capsule Take 100 mg by mouth 3 (three) times daily.  ipratropium (ATROVENT) 0.06 % nasal spray SMARTSIG:2 Spray(s) Both Nares Twice Daily PRN     latanoprost (XALATAN) 0.005 % ophthalmic solution 1 drop at bedtime.     metFORMIN (GLUCOPHAGE) 500 MG tablet Take 500 mg by mouth daily. AM     methotrexate (RHEUMATREX) 2.5 MG tablet Take 2.5 mg by mouth once a week. 4 tabs every  Wednesday     pantoprazole (PROTONIX) 40 MG tablet Take 40 mg by mouth daily.     potassium chloride (KLOR-CON) 10 MEQ tablet Take 10 mEq by mouth daily.     prednisoLONE 5 MG TABS tablet Take 5 mg by mouth every other day.     sertraline (ZOLOFT) 50 MG tablet Take 50 mg by mouth daily. PM     tiZANidine (ZANAFLEX) 4 MG tablet Take 4 mg by mouth 3 (three) times daily as needed.     traMADol (ULTRAM) 50 MG tablet Take 50 mg by mouth daily as needed.     traZODone (DESYREL) 50 MG tablet Take 50 mg by mouth at bedtime as needed.     acetaminophen (TYLENOL) 500 MG tablet Take 1,000 mg by mouth at bedtime. (Patient not  taking: Reported on 07/16/2021)     acyclovir ointment (ZOVIRAX) 5 % Apply 1 application topically every 3 (three) hours. (Patient not taking: Reported on 07/02/2021)     omeprazole (PRILOSEC) 20 MG capsule Take 20 mg by mouth daily. AM (Patient not taking: Reported on 07/16/2021)     predniSONE (DELTASONE) 10 MG tablet Take by mouth. (Patient not taking: Reported on 07/30/2021)     No current facility-administered medications for this visit.    PHYSICAL EXAMINATION: Vitals:   07/30/21 1333  BP: 139/76  Pulse: 77  Resp: 16  Temp: (!) 97.2 F (36.2 C)  SpO2: 99%   Filed Weights   07/30/21 1333  Weight: 97 lb (44 kg)   Physical Exam Constitutional:      Appearance: She is not ill-appearing.     Comments: Thin build. Frail. Accompanied  Eyes:     General: No scleral icterus.    Conjunctiva/sclera: Conjunctivae normal.  Cardiovascular:     Rate and Rhythm: Normal rate and regular rhythm.  Abdominal:     Palpations: Abdomen is soft.  Musculoskeletal:        General: No deformity or signs of injury.     Right lower leg: No edema.     Left lower leg: No edema.  Lymphadenopathy:     Cervical: No cervical adenopathy.  Skin:    General: Skin is warm and dry.  Neurological:     Mental Status: She is alert and oriented to person, place, and time. Mental status is at baseline.  Psychiatric:        Mood and Affect: Mood normal.        Behavior: Behavior normal.    LABORATORY DATA:  I have reviewed the data as listed Lab Results  Component Value Date   WBC 3.9 (L) 07/30/2021   HGB 8.5 (L) 07/30/2021   HCT 29.0 (L) 07/30/2021   MCV 80.8 07/30/2021   PLT 284 07/30/2021   Recent Labs    07/02/21 1200 07/09/21 1112 07/16/21 1251 07/30/21 1309  NA 135 136 137 132*  K 2.6* 3.9 4.0 4.3  CL 98 103 101 93*  CO2 _0 GLUCOSE 153* 117* 137* 122*  BUN _1 CREATININE 0.68 0.60 0.78 0.73  CALCIUM 8.4* 8.7* 9.2 9.2  GFRNONAA >60 >60 >60 >60  PROT 6.1*  --    --   --   ALBUMIN 3.5  --   --   --   AST 26  --   --   --   ALT 16  --   --   --   ALKPHOS 86  --   --   --   BILITOT 0.8  --   --   --    Iron/TIBC/Ferritin/ %Sat    Component Value Date/Time   IRON 17 (L) 07/02/2021 1200   TIBC 396 07/02/2021 1200   FERRITIN 14 07/02/2021 1200   IRONPCTSAT 4 (L) 07/02/2021 1200    No results found.   Assessment & Plan:  No problem-specific Assessment & Plan notes found for this encounter.  Hypokalemia- potassium 20 eq BID refilled on 07/12/21. Continue oral potassium. Has improved and now normalized.  Anemia- Ferritin 14, iron saturation 4%. s/p venofer x 3. Tolerated well. Monitor Falls- lengthy discussion regarding risks of falling and need for intervention and workup. Patient declines. Additionally declines physical therapy referral. She agrees to contact her PCP and her cardiologist for evaluation.   Disposition:  Venofer x 2.  RTC in 8 weeks for labs (cbc, cmp, ferritin, iron studies), see Dr Rogue Bussing, possible venofer- la  All questions were answered. The patient knows to call the clinic with any problems, questions or concerns.   Verlon Au, NP 07/30/2021   CC: Dr. Doy Hutching

## 2021-08-02 ENCOUNTER — Telehealth: Payer: Self-pay | Admitting: Internal Medicine

## 2021-08-02 ENCOUNTER — Other Ambulatory Visit: Payer: Self-pay

## 2021-08-02 ENCOUNTER — Ambulatory Visit: Payer: PPO | Admitting: Surgery

## 2021-08-02 ENCOUNTER — Encounter: Payer: Self-pay | Admitting: Surgery

## 2021-08-02 VITALS — BP 145/69 | HR 92 | Temp 98.0°F | Ht 66.0 in | Wt 97.0 lb

## 2021-08-02 DIAGNOSIS — J00 Acute nasopharyngitis [common cold]: Secondary | ICD-10-CM | POA: Diagnosis not present

## 2021-08-02 DIAGNOSIS — R54 Age-related physical debility: Secondary | ICD-10-CM | POA: Diagnosis not present

## 2021-08-02 DIAGNOSIS — D649 Anemia, unspecified: Secondary | ICD-10-CM | POA: Diagnosis not present

## 2021-08-02 DIAGNOSIS — K449 Diaphragmatic hernia without obstruction or gangrene: Secondary | ICD-10-CM

## 2021-08-02 DIAGNOSIS — M48061 Spinal stenosis, lumbar region without neurogenic claudication: Secondary | ICD-10-CM | POA: Diagnosis not present

## 2021-08-02 NOTE — Telephone Encounter (Signed)
Will mail

## 2021-08-02 NOTE — Telephone Encounter (Signed)
Pt called and states that she didn't get her paperwork on Friday. Wants you to mail it to her.

## 2021-08-02 NOTE — Patient Instructions (Signed)
Our surgery scheduler Pamala Hurry will call you within 24-48 hours to get you scheduled. If you have not heard from her after 48 hours, please call our office. You will need to get Covid tested before surgery and have the blue sheet available when she calls to write down important information.   If you have any concerns or questions, please feel free to call our office.   Hiatal Hernia  A hiatal hernia occurs when part of the stomach slides above the muscle that separates the abdomen from the chest (diaphragm). A person can be born with a hiatal hernia (congenital), or it may develop over time. In almost all cases of hiatal hernia, only the top part of the stomach pushes through the diaphragm. Many people have a hiatal hernia with no symptoms. The larger the hernia, the more likely it is that you will have symptoms. In some cases, a hiatal hernia allows stomach acid to flow back into the tube that carries food from your mouth to your stomach (esophagus). This may cause heartburn symptoms. Severe heartburn symptoms may mean that you have developed a condition called gastroesophageal reflux disease (GERD). What are the causes? This condition is caused by a weakness in the opening (hiatus) where the esophagus passes through the diaphragm to attach to the upper part of the stomach. A person may be born with a weakness in the hiatus, or a weakness can develop over time. What increases the risk? This condition is more likely to develop in: Older people. Age is a major risk factor for a hiatal hernia, especially if you are over the age of 53. Pregnant women. People who are overweight. People who have frequent constipation. What are the signs or symptoms? Symptoms of this condition usually develop in the form of GERD symptoms. Symptoms include: Heartburn. Belching. Indigestion. Trouble swallowing. Coughing or wheezing. Sore throat. Hoarseness. Chest pain. Nausea and vomiting. How is this  diagnosed? This condition may be diagnosed during testing for GERD. Tests that may be done include: X-rays of your stomach or chest. An upper gastrointestinal (GI) series. This is an X-ray exam of your GI tract that is taken after you swallow a chalky liquid that shows up clearly on the X-ray. Endoscopy. This is a procedure to look into your stomach using a thin, flexible tube that has a tiny camera and light on the end of it. How is this treated? This condition may be treated by: Dietary and lifestyle changes to help reduce GERD symptoms. Medicines. These may include: Over-the-counter antacids. Medicines that make your stomach empty more quickly. Medicines that block the production of stomach acid (H2 blockers). Stronger medicines to reduce stomach acid (proton pump inhibitors). Surgery to repair the hernia, if other treatments are not helping. If you have no symptoms, you may not need treatment. Follow these instructions at home: Lifestyle and activity Do not use any products that contain nicotine or tobacco, such as cigarettes and e-cigarettes. If you need help quitting, ask your health care provider. Try to achieve and maintain a healthy body weight. Avoid putting pressure on your abdomen. Anything that puts pressure on your abdomen increases the amount of acid that may be pushed up into your esophagus. Avoid bending over, especially after eating. Raise the head of your bed by putting blocks under the legs. This keeps your head and esophagus higher than your stomach. Do not wear tight clothing around your chest or stomach. Try not to strain when having a bowel movement, when urinating, or when  lifting heavy objects. Eating and drinking Avoid foods that can worsen GERD symptoms. These may include: Fatty foods, like fried foods. Citrus fruits, like oranges or lemon. Other foods and drinks that contain acid, like orange juice or tomatoes. Spicy food. Chocolate. Eat frequent small  meals instead of three large meals a day. This helps prevent your stomach from getting too full. Eat slowly. Do not lie down right after eating. Do not eat 1-2 hours before bed. Do not drink beverages with caffeine. These include cola, coffee, cocoa, and tea. Do not drink alcohol. General instructions Take over-the-counter and prescription medicines only as told by your health care provider. Keep all follow-up visits as told by your health care provider. This is important. Contact a health care provider if: Your symptoms are not controlled with medicines or lifestyle changes. You are having trouble swallowing. You have coughing or wheezing that will not go away. Get help right away if: Your pain is getting worse. Your pain spreads to your arms, neck, jaw, teeth, or back. You have shortness of breath. You sweat for no reason. You feel sick to your stomach (nauseous) or you vomit. You vomit blood. You have bright red blood in your stools. You have black, tarry stools. Summary A hiatal hernia occurs when part of the stomach slides above the muscle that separates the abdomen from the chest (diaphragm). A person may be born with a weakness in the hiatus, or a weakness can develop over time. Symptoms of hiatal hernia may include heartburn, trouble swallowing, or sore throat. Management of hiatal hernia includes eating frequent small meals instead of three large meals a day. Get help right away if you vomit blood, have bright red blood in your stools, or have black, tarry stools. This information is not intended to replace advice given to you by your health care provider. Make sure you discuss any questions you have with your health care provider. Document Revised: 07/30/2020 Document Reviewed: 07/30/2020 Elsevier Patient Education  2022 [employer].

## 2021-08-03 ENCOUNTER — Telehealth: Payer: Self-pay | Admitting: Surgery

## 2021-08-03 ENCOUNTER — Inpatient Hospital Stay: Payer: PPO

## 2021-08-03 NOTE — Telephone Encounter (Signed)
Outgoing call is made, spoke with friend on [place], Barbaraann Rondo, they are given the information as follows for surgery.   Patient has been advised of Pre-Admission date/time, COVID Testing date and Surgery date.  Surgery Date: 08/24/21 Preadmission Testing Date: 08/17/21 (phone 8a-1p) Covid Testing Date: 08/20/21 @ 8:25 am - patient advised to go to the [facility] ([place]) between 8a-12:00 pm  Patient has been made aware to call [phone], between 1-3:00pm the day before surgery, to find out what time to arrive for surgery.

## 2021-08-04 NOTE — Progress Notes (Signed)
Outpatient Surgical Follow Up  08/04/2021  Wanda Monroe is an 80 y.o. female.   Chief Complaint  Patient presents with   Follow-up    HPI: Wanda Monroe is a 80 y.o. female seen in consultation at the request of Dr.Aleskerov for mild paraesophageal hernia.  Her main complaint is dyspnea on exertion.  She also endorses significant reflux disease.  Worsening when she lays on her back.  PPI only partially relieves reflux symptoms.  She denies any dysphagia.  He does have a history of rheumatoid arthritis and is followed by Dr. Jefm Bryant. 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 Also had had work-up with cardiology Dr. Saralyn Pilar showing severe tricuspid regurgitation and moderate aortic insufficiency with normal ventricular function. She does have significant anemia with a hemoglobin of 8 normal platelet count and normal white count, CMP is completely norm. EGD confirmed hiatal hernia and swallow showed no strictures, esophageal motility observed.  Past Medical History:  Diagnosis Date   Anemia    Arthritis    rhuematoid,OSTEOARTHRITIS   Cellulitis    Diabetes mellitus without complication (HCC)    type II   GERD (gastroesophageal reflux disease)    Gout    Hiatal hernia    Hyperlipidemia    Hypertension    Neuromuscular disorder ([place])    left foot neuropathy   Osteoporosis    Rosacea    Spinal stenosis of lumbar region    Stroke [facility])    x2, 9/10   Temporal arteritis ([place])    Wears dentures    full top, partial bottom    Past Surgical History:  Procedure Laterality Date   ABDOMINAL HYSTERECTOMY     APPENDECTOMY     BACK SURGERY     lumbar and cervical laminectomies   BREAST BIOPSY Left 1960,1980   twice negative results   CAPSULOTOMY METATARSOPHALANGEAL Left 01/22/2015   Procedure: CAPSULOTOMY METATARSOPHALANGEAL;  Surgeon:  Albertine Patricia, DPM;  Location: [place];  Service: Podiatry;  Laterality: Left;   CATARACT EXTRACTION W/PHACO Left 10/19/2016   Procedure: CATARACT EXTRACTION PHACO AND INTRAOCULAR LENS PLACEMENT (IOC)  Left Eye  Diabetic;  Surgeon: Leandrew Koyanagi, MD;  Location: [place];  Service: Ophthalmology;  Laterality: Left;  Diabetic - oral meds Left Eye   CATARACT EXTRACTION W/PHACO Right 11/16/2016   Procedure: CATARACT EXTRACTION PHACO AND INTRAOCULAR LENS PLACEMENT (IOC) dIABETIC;  Surgeon: Leandrew Koyanagi, MD;  Location: [place];  Service: Ophthalmology;  Laterality: Right;  DIABETES - oral meds   COLONOSCOPY WITH PROPOFOL N/A 04/24/2018   Procedure: COLONOSCOPY WITH PROPOFOL;  Surgeon: Toledo, Benay Pike, MD;  Location: [facility];  Service: Gastroenterology;  Laterality: N/A;   ESOPHAGOGASTRODUODENOSCOPY N/A 07/01/2021   Procedure: ESOPHAGOGASTRODUODENOSCOPY (EGD);  Surgeon: Benjamine Sprague, DO;  Location: [facility];  Service: General;  Laterality: N/A;   ESOPHAGOGASTRODUODENOSCOPY (EGD) WITH PROPOFOL N/A 04/24/2018   Procedure: ESOPHAGOGASTRODUODENOSCOPY (EGD) WITH PROPOFOL;  Surgeon: Toledo, Benay Pike, MD;  Location: [facility];  Service: Gastroenterology;  Laterality: N/A;   FOOT SURGERY     HALLUX VALGUS AKIN Left 01/22/2015   Procedure: HALLUX VALGUS AKIN;  Surgeon: Albertine Patricia, DPM;  Location: [place];  Service: Podiatry;  Laterality: Left;  left great toe   HEMORRHOID SURGERY     KNEE ARTHROSCOPY     LUMBAR LAMINECTOMY  04/23/13   NOSE SURGERY  TONSILLECTOMY     URETER SURGERY      Family History  Problem Relation Age of Onset   Diabetes Father    Stroke Father    Breast cancer Neg Hx     Social History:  reports that she has never smoked. She has never used smokeless tobacco. She reports that she does not currently use drugs. She reports that she does not drink alcohol.  Allergies:  Allergies  Allergen Reactions    Codeine Sulfate Nausea Only   Remicade [Infliximab] Other (See Comments)    Infection    Medications reviewed.    ROS Full ROS performed and is otherwise negative other than what is stated in HPI   BP (!) 145/69   Pulse 92   Temp 98 F (36.7 C)   Ht 5\' 6"  (1.676 m)   Wt 97 lb (44 kg)   SpO2 90%   BMI 15.66 kg/m   Physical Exam CONSTITUTIONAL: NAd, BMI 17,  EYES: Pupils are equal, round,  Sclera are non-icteric. EARS, NOSE, MOUTH AND THROAT: she is wearing a mask Hearing is intact to voice. LYMPH NODES:  Lymph nodes in the neck are normal. RESPIRATORY:  Lungs are clear. There is normal respiratory effort, with equal breath sounds bilaterally, and without pathologic use of accessory muscles. CARDIOVASCULAR: Heart is regular without murmurs, gallops, or rubs. GI: The abdomen is soft, nontender, and nondistended. There are no palpable masses. There is no hepatosplenomegaly. There are normal bowel sounds in all quadrants. GU: Rectal deferred.   MUSCULOSKELETAL: Normal muscle strength and tone. No cyanosis or edema.   SKIN: Turgor is good and there are no pathologic skin lesions or ulcers. NEUROLOGIC: Motor and sensation is grossly normal. Cranial nerves are grossly intact. PSYCH:  Oriented to person, place and time. Affect is normal.    Assessment/Plan: 80 year old female with recalcitrant reflux and a large hiatal hernia.  She does have significant pulmonary issues attributed to cough and chronic reflux.  She also has concerning weight issues because of her inability to tolerate meals due to her hernia. I had an extensive discussion with her about her disease process.  My concern will be for malnutrition however I do think that operative intervention is indicated.  Discussed with her in detail about potentially optimization of her perioperatively with enteral nutrition.  She wishes to try to do this on her own.  I discussed with her that she is at high risk of developing wound  complications to include ventral hernias recurrences.  She understands the situation.  She wishes to proceed with robotic hiatal hernia repair.  Procedure discussed with the patient in detail.  Risks, benefits and possible occasions including but not limited to: Bleeding, infection esophageal injuries, recurrence, chronic pain and potential reinterventions, she understands and wished to proceed. Please note that I spent at least 45 minutes in this encounter including coordinating her care, placing orders and performing appropriate documentation PLAN: ROBOTIC paraesophageal Repair w Partial fundoplication  Caroleen Hamman, MD [place] Surgeon

## 2021-08-09 DIAGNOSIS — J019 Acute sinusitis, unspecified: Secondary | ICD-10-CM | POA: Diagnosis not present

## 2021-08-09 DIAGNOSIS — R54 Age-related physical debility: Secondary | ICD-10-CM | POA: Diagnosis not present

## 2021-08-11 DIAGNOSIS — M1990 Unspecified osteoarthritis, unspecified site: Secondary | ICD-10-CM | POA: Diagnosis not present

## 2021-08-11 DIAGNOSIS — D649 Anemia, unspecified: Secondary | ICD-10-CM | POA: Diagnosis not present

## 2021-08-11 DIAGNOSIS — E785 Hyperlipidemia, unspecified: Secondary | ICD-10-CM | POA: Diagnosis not present

## 2021-08-11 DIAGNOSIS — R54 Age-related physical debility: Secondary | ICD-10-CM | POA: Diagnosis not present

## 2021-08-11 DIAGNOSIS — Z8673 Personal history of transient ischemic attack (TIA), and cerebral infarction without residual deficits: Secondary | ICD-10-CM | POA: Diagnosis not present

## 2021-08-11 DIAGNOSIS — I10 Essential (primary) hypertension: Secondary | ICD-10-CM | POA: Diagnosis not present

## 2021-08-16 ENCOUNTER — Telehealth: Payer: Self-pay | Admitting: Surgery

## 2021-08-16 DIAGNOSIS — E785 Hyperlipidemia, unspecified: Secondary | ICD-10-CM | POA: Diagnosis not present

## 2021-08-16 DIAGNOSIS — R636 Underweight: Secondary | ICD-10-CM | POA: Diagnosis not present

## 2021-08-16 DIAGNOSIS — E118 Type 2 diabetes mellitus with unspecified complications: Secondary | ICD-10-CM | POA: Diagnosis not present

## 2021-08-16 DIAGNOSIS — Z681 Body mass index (BMI) 19 or less, adult: Secondary | ICD-10-CM | POA: Diagnosis not present

## 2021-08-16 DIAGNOSIS — K219 Gastro-esophageal reflux disease without esophagitis: Secondary | ICD-10-CM | POA: Diagnosis not present

## 2021-08-16 DIAGNOSIS — I10 Essential (primary) hypertension: Secondary | ICD-10-CM | POA: Diagnosis not present

## 2021-08-16 DIAGNOSIS — Z9889 Other specified postprocedural states: Secondary | ICD-10-CM | POA: Diagnosis not present

## 2021-08-16 DIAGNOSIS — G4700 Insomnia, unspecified: Secondary | ICD-10-CM | POA: Diagnosis not present

## 2021-08-16 DIAGNOSIS — J329 Chronic sinusitis, unspecified: Secondary | ICD-10-CM | POA: Diagnosis not present

## 2021-08-16 DIAGNOSIS — M0579 Rheumatoid arthritis with rheumatoid factor of multiple sites without organ or systems involvement: Secondary | ICD-10-CM | POA: Diagnosis not present

## 2021-08-16 DIAGNOSIS — R54 Age-related physical debility: Secondary | ICD-10-CM | POA: Diagnosis not present

## 2021-08-16 NOTE — Telephone Encounter (Signed)
Wanda Monroe called for [employer].  She wants to cancel her surgery with Dr. Dahlia Byes for 08/24/21. She is cancelling because she has lost a lot of weight and is very concerned about her recovery.  She wants to work on being able to gain some weight and able to maintain her weight before she thinks about rescheduling.  They are to call for follow up with Dr. Dahlia Byes if decides to reschedule.

## 2021-08-17 ENCOUNTER — Inpatient Hospital Stay: Admission: RE | Admit: 2021-08-17 | Payer: PPO | Source: Ambulatory Visit

## 2021-08-17 DIAGNOSIS — R0602 Shortness of breath: Secondary | ICD-10-CM | POA: Diagnosis not present

## 2021-08-20 ENCOUNTER — Other Ambulatory Visit: Payer: PPO

## 2021-08-23 DIAGNOSIS — E119 Type 2 diabetes mellitus without complications: Secondary | ICD-10-CM | POA: Diagnosis not present

## 2021-08-23 DIAGNOSIS — J329 Chronic sinusitis, unspecified: Secondary | ICD-10-CM | POA: Diagnosis not present

## 2021-08-24 ENCOUNTER — Inpatient Hospital Stay: Admission: RE | Admit: 2021-08-24 | Payer: PPO | Source: Home / Self Care | Admitting: Surgery

## 2021-08-24 ENCOUNTER — Encounter: Admission: RE | Payer: Self-pay | Source: Home / Self Care

## 2021-08-24 SURGERY — REPAIR, HERNIA, HIATAL, ROBOT-ASSISTED
Anesthesia: General

## 2021-08-26 ENCOUNTER — Inpatient Hospital Stay: Payer: PPO | Attending: Nurse Practitioner

## 2021-08-30 DIAGNOSIS — I10 Essential (primary) hypertension: Secondary | ICD-10-CM | POA: Diagnosis not present

## 2021-08-30 DIAGNOSIS — E785 Hyperlipidemia, unspecified: Secondary | ICD-10-CM | POA: Diagnosis not present

## 2021-08-30 DIAGNOSIS — D649 Anemia, unspecified: Secondary | ICD-10-CM | POA: Diagnosis not present

## 2021-08-30 DIAGNOSIS — R54 Age-related physical debility: Secondary | ICD-10-CM | POA: Diagnosis not present

## 2021-08-30 DIAGNOSIS — I639 Cerebral infarction, unspecified: Secondary | ICD-10-CM | POA: Diagnosis not present

## 2021-08-30 DIAGNOSIS — M1990 Unspecified osteoarthritis, unspecified site: Secondary | ICD-10-CM | POA: Diagnosis not present

## 2021-08-31 DIAGNOSIS — I639 Cerebral infarction, unspecified: Secondary | ICD-10-CM | POA: Diagnosis not present

## 2021-08-31 DIAGNOSIS — Z8673 Personal history of transient ischemic attack (TIA), and cerebral infarction without residual deficits: Secondary | ICD-10-CM | POA: Diagnosis not present

## 2021-08-31 DIAGNOSIS — R634 Abnormal weight loss: Secondary | ICD-10-CM | POA: Diagnosis not present

## 2021-08-31 DIAGNOSIS — D649 Anemia, unspecified: Secondary | ICD-10-CM | POA: Diagnosis not present

## 2021-08-31 DIAGNOSIS — E785 Hyperlipidemia, unspecified: Secondary | ICD-10-CM | POA: Diagnosis not present

## 2021-08-31 DIAGNOSIS — R54 Age-related physical debility: Secondary | ICD-10-CM | POA: Diagnosis not present

## 2021-08-31 DIAGNOSIS — E118 Type 2 diabetes mellitus with unspecified complications: Secondary | ICD-10-CM | POA: Diagnosis not present

## 2021-09-23 DIAGNOSIS — M0579 Rheumatoid arthritis with rheumatoid factor of multiple sites without organ or systems involvement: Secondary | ICD-10-CM | POA: Diagnosis not present

## 2021-09-23 DIAGNOSIS — E119 Type 2 diabetes mellitus without complications: Secondary | ICD-10-CM | POA: Diagnosis not present

## 2021-09-23 DIAGNOSIS — M48061 Spinal stenosis, lumbar region without neurogenic claudication: Secondary | ICD-10-CM | POA: Diagnosis not present

## 2021-09-23 DIAGNOSIS — K5900 Constipation, unspecified: Secondary | ICD-10-CM | POA: Diagnosis not present

## 2021-09-28 ENCOUNTER — Other Ambulatory Visit: Payer: Self-pay | Admitting: *Deleted

## 2021-09-28 DIAGNOSIS — M1990 Unspecified osteoarthritis, unspecified site: Secondary | ICD-10-CM | POA: Diagnosis not present

## 2021-09-28 DIAGNOSIS — M0579 Rheumatoid arthritis with rheumatoid factor of multiple sites without organ or systems involvement: Secondary | ICD-10-CM | POA: Diagnosis not present

## 2021-09-28 DIAGNOSIS — D649 Anemia, unspecified: Secondary | ICD-10-CM

## 2021-09-28 DIAGNOSIS — D509 Iron deficiency anemia, unspecified: Secondary | ICD-10-CM

## 2021-10-04 ENCOUNTER — Inpatient Hospital Stay: Payer: PPO | Attending: Internal Medicine

## 2021-10-04 ENCOUNTER — Inpatient Hospital Stay: Payer: PPO

## 2021-10-04 ENCOUNTER — Inpatient Hospital Stay: Payer: PPO | Admitting: Internal Medicine

## 2021-10-11 DIAGNOSIS — Z8673 Personal history of transient ischemic attack (TIA), and cerebral infarction without residual deficits: Secondary | ICD-10-CM | POA: Diagnosis not present

## 2021-10-11 DIAGNOSIS — D649 Anemia, unspecified: Secondary | ICD-10-CM | POA: Diagnosis not present

## 2021-10-11 DIAGNOSIS — I10 Essential (primary) hypertension: Secondary | ICD-10-CM | POA: Diagnosis not present

## 2021-10-11 DIAGNOSIS — R54 Age-related physical debility: Secondary | ICD-10-CM | POA: Diagnosis not present

## 2021-10-11 DIAGNOSIS — M1990 Unspecified osteoarthritis, unspecified site: Secondary | ICD-10-CM | POA: Diagnosis not present

## 2021-10-11 DIAGNOSIS — E785 Hyperlipidemia, unspecified: Secondary | ICD-10-CM | POA: Diagnosis not present

## 2021-10-18 DIAGNOSIS — E119 Type 2 diabetes mellitus without complications: Secondary | ICD-10-CM | POA: Diagnosis not present

## 2021-10-18 DIAGNOSIS — K5900 Constipation, unspecified: Secondary | ICD-10-CM | POA: Diagnosis not present

## 2021-10-18 DIAGNOSIS — M1990 Unspecified osteoarthritis, unspecified site: Secondary | ICD-10-CM | POA: Diagnosis not present

## 2021-10-18 DIAGNOSIS — E785 Hyperlipidemia, unspecified: Secondary | ICD-10-CM | POA: Diagnosis not present

## 2021-10-18 DIAGNOSIS — J310 Chronic rhinitis: Secondary | ICD-10-CM | POA: Diagnosis not present

## 2021-10-18 DIAGNOSIS — D649 Anemia, unspecified: Secondary | ICD-10-CM | POA: Diagnosis not present

## 2021-10-18 DIAGNOSIS — G4700 Insomnia, unspecified: Secondary | ICD-10-CM | POA: Diagnosis not present

## 2021-10-18 DIAGNOSIS — M0579 Rheumatoid arthritis with rheumatoid factor of multiple sites without organ or systems involvement: Secondary | ICD-10-CM | POA: Diagnosis not present

## 2021-10-18 DIAGNOSIS — M48061 Spinal stenosis, lumbar region without neurogenic claudication: Secondary | ICD-10-CM | POA: Diagnosis not present

## 2021-11-08 DIAGNOSIS — I10 Essential (primary) hypertension: Secondary | ICD-10-CM | POA: Diagnosis not present

## 2021-11-08 DIAGNOSIS — Z8673 Personal history of transient ischemic attack (TIA), and cerebral infarction without residual deficits: Secondary | ICD-10-CM | POA: Diagnosis not present

## 2021-11-08 DIAGNOSIS — M1990 Unspecified osteoarthritis, unspecified site: Secondary | ICD-10-CM | POA: Diagnosis not present

## 2021-11-08 DIAGNOSIS — D649 Anemia, unspecified: Secondary | ICD-10-CM | POA: Diagnosis not present

## 2021-11-08 DIAGNOSIS — E785 Hyperlipidemia, unspecified: Secondary | ICD-10-CM | POA: Diagnosis not present

## 2021-11-08 DIAGNOSIS — R54 Age-related physical debility: Secondary | ICD-10-CM | POA: Diagnosis not present

## 2021-11-15 DIAGNOSIS — D649 Anemia, unspecified: Secondary | ICD-10-CM | POA: Diagnosis not present

## 2021-11-15 DIAGNOSIS — M1990 Unspecified osteoarthritis, unspecified site: Secondary | ICD-10-CM | POA: Diagnosis not present

## 2021-11-15 DIAGNOSIS — M48061 Spinal stenosis, lumbar region without neurogenic claudication: Secondary | ICD-10-CM | POA: Diagnosis not present

## 2021-11-15 DIAGNOSIS — E785 Hyperlipidemia, unspecified: Secondary | ICD-10-CM | POA: Diagnosis not present

## 2021-11-15 DIAGNOSIS — E118 Type 2 diabetes mellitus with unspecified complications: Secondary | ICD-10-CM | POA: Diagnosis not present

## 2021-11-15 DIAGNOSIS — Z7984 Long term (current) use of oral hypoglycemic drugs: Secondary | ICD-10-CM | POA: Diagnosis not present

## 2021-11-15 DIAGNOSIS — R54 Age-related physical debility: Secondary | ICD-10-CM | POA: Diagnosis not present

## 2021-11-15 DIAGNOSIS — E119 Type 2 diabetes mellitus without complications: Secondary | ICD-10-CM | POA: Diagnosis not present

## 2021-11-15 DIAGNOSIS — K5900 Constipation, unspecified: Secondary | ICD-10-CM | POA: Diagnosis not present

## 2021-11-15 DIAGNOSIS — Z8673 Personal history of transient ischemic attack (TIA), and cerebral infarction without residual deficits: Secondary | ICD-10-CM | POA: Diagnosis not present

## 2021-11-15 DIAGNOSIS — M0579 Rheumatoid arthritis with rheumatoid factor of multiple sites without organ or systems involvement: Secondary | ICD-10-CM | POA: Diagnosis not present

## 2021-11-15 DIAGNOSIS — G4700 Insomnia, unspecified: Secondary | ICD-10-CM | POA: Diagnosis not present

## 2021-11-15 DIAGNOSIS — J310 Chronic rhinitis: Secondary | ICD-10-CM | POA: Diagnosis not present

## 2021-11-18 DIAGNOSIS — T380X5D Adverse effect of glucocorticoids and synthetic analogues, subsequent encounter: Secondary | ICD-10-CM | POA: Diagnosis not present

## 2021-11-18 DIAGNOSIS — E273 Drug-induced adrenocortical insufficiency: Secondary | ICD-10-CM | POA: Diagnosis not present

## 2021-11-18 DIAGNOSIS — E441 Mild protein-calorie malnutrition: Secondary | ICD-10-CM | POA: Diagnosis not present

## 2021-11-18 DIAGNOSIS — M059 Rheumatoid arthritis with rheumatoid factor, unspecified: Secondary | ICD-10-CM | POA: Diagnosis not present

## 2021-11-18 DIAGNOSIS — E119 Type 2 diabetes mellitus without complications: Secondary | ICD-10-CM | POA: Diagnosis not present

## 2021-11-18 DIAGNOSIS — Z7952 Long term (current) use of systemic steroids: Secondary | ICD-10-CM | POA: Diagnosis not present

## 2021-11-18 DIAGNOSIS — Z7984 Long term (current) use of oral hypoglycemic drugs: Secondary | ICD-10-CM | POA: Diagnosis not present

## 2021-11-18 DIAGNOSIS — Z681 Body mass index (BMI) 19 or less, adult: Secondary | ICD-10-CM | POA: Diagnosis not present

## 2021-11-18 DIAGNOSIS — J449 Chronic obstructive pulmonary disease, unspecified: Secondary | ICD-10-CM | POA: Diagnosis not present

## 2021-12-08 DIAGNOSIS — E785 Hyperlipidemia, unspecified: Secondary | ICD-10-CM | POA: Diagnosis not present

## 2021-12-08 DIAGNOSIS — Z8673 Personal history of transient ischemic attack (TIA), and cerebral infarction without residual deficits: Secondary | ICD-10-CM | POA: Diagnosis not present

## 2021-12-08 DIAGNOSIS — M1990 Unspecified osteoarthritis, unspecified site: Secondary | ICD-10-CM | POA: Diagnosis not present

## 2021-12-08 DIAGNOSIS — D649 Anemia, unspecified: Secondary | ICD-10-CM | POA: Diagnosis not present

## 2021-12-08 DIAGNOSIS — I10 Essential (primary) hypertension: Secondary | ICD-10-CM | POA: Diagnosis not present

## 2021-12-08 DIAGNOSIS — R54 Age-related physical debility: Secondary | ICD-10-CM | POA: Diagnosis not present

## 2021-12-14 DIAGNOSIS — M0579 Rheumatoid arthritis with rheumatoid factor of multiple sites without organ or systems involvement: Secondary | ICD-10-CM | POA: Diagnosis not present

## 2021-12-14 DIAGNOSIS — R54 Age-related physical debility: Secondary | ICD-10-CM | POA: Diagnosis not present

## 2021-12-14 DIAGNOSIS — E785 Hyperlipidemia, unspecified: Secondary | ICD-10-CM | POA: Diagnosis not present

## 2021-12-14 DIAGNOSIS — E119 Type 2 diabetes mellitus without complications: Secondary | ICD-10-CM | POA: Diagnosis not present

## 2021-12-14 DIAGNOSIS — R634 Abnormal weight loss: Secondary | ICD-10-CM | POA: Diagnosis not present

## 2021-12-14 DIAGNOSIS — R630 Anorexia: Secondary | ICD-10-CM | POA: Diagnosis not present

## 2021-12-25 DIAGNOSIS — D8481 Immunodeficiency due to conditions classified elsewhere: Secondary | ICD-10-CM | POA: Diagnosis not present

## 2021-12-25 DIAGNOSIS — R0600 Dyspnea, unspecified: Secondary | ICD-10-CM | POA: Diagnosis not present

## 2021-12-25 DIAGNOSIS — Z79899 Other long term (current) drug therapy: Secondary | ICD-10-CM | POA: Diagnosis not present

## 2021-12-25 DIAGNOSIS — K449 Diaphragmatic hernia without obstruction or gangrene: Secondary | ICD-10-CM | POA: Diagnosis not present

## 2021-12-25 DIAGNOSIS — Z8673 Personal history of transient ischemic attack (TIA), and cerebral infarction without residual deficits: Secondary | ICD-10-CM | POA: Diagnosis not present

## 2021-12-25 DIAGNOSIS — K224 Dyskinesia of esophagus: Secondary | ICD-10-CM | POA: Diagnosis not present

## 2021-12-25 DIAGNOSIS — M0579 Rheumatoid arthritis with rheumatoid factor of multiple sites without organ or systems involvement: Secondary | ICD-10-CM | POA: Diagnosis not present

## 2021-12-25 DIAGNOSIS — R109 Unspecified abdominal pain: Secondary | ICD-10-CM | POA: Diagnosis not present

## 2021-12-25 DIAGNOSIS — E1149 Type 2 diabetes mellitus with other diabetic neurological complication: Secondary | ICD-10-CM | POA: Diagnosis not present

## 2021-12-27 DIAGNOSIS — N950 Postmenopausal bleeding: Secondary | ICD-10-CM | POA: Diagnosis not present

## 2021-12-27 DIAGNOSIS — I10 Essential (primary) hypertension: Secondary | ICD-10-CM | POA: Diagnosis not present

## 2021-12-27 DIAGNOSIS — R634 Abnormal weight loss: Secondary | ICD-10-CM | POA: Diagnosis not present

## 2021-12-27 DIAGNOSIS — D649 Anemia, unspecified: Secondary | ICD-10-CM | POA: Diagnosis not present

## 2021-12-27 DIAGNOSIS — R399 Unspecified symptoms and signs involving the genitourinary system: Secondary | ICD-10-CM | POA: Diagnosis not present

## 2021-12-29 DIAGNOSIS — M25561 Pain in right knee: Secondary | ICD-10-CM | POA: Diagnosis not present

## 2021-12-29 DIAGNOSIS — W010XXA Fall on same level from slipping, tripping and stumbling without subsequent striking against object, initial encounter: Secondary | ICD-10-CM | POA: Diagnosis not present

## 2021-12-29 DIAGNOSIS — N390 Urinary tract infection, site not specified: Secondary | ICD-10-CM | POA: Diagnosis not present

## 2022-01-05 DIAGNOSIS — E1149 Type 2 diabetes mellitus with other diabetic neurological complication: Secondary | ICD-10-CM | POA: Diagnosis not present

## 2022-01-05 DIAGNOSIS — R54 Age-related physical debility: Secondary | ICD-10-CM | POA: Diagnosis not present

## 2022-01-05 DIAGNOSIS — M1990 Unspecified osteoarthritis, unspecified site: Secondary | ICD-10-CM | POA: Diagnosis not present

## 2022-01-05 DIAGNOSIS — E785 Hyperlipidemia, unspecified: Secondary | ICD-10-CM | POA: Diagnosis not present

## 2022-01-05 DIAGNOSIS — D649 Anemia, unspecified: Secondary | ICD-10-CM | POA: Diagnosis not present

## 2022-01-05 DIAGNOSIS — I10 Essential (primary) hypertension: Secondary | ICD-10-CM | POA: Diagnosis not present

## 2022-01-05 DIAGNOSIS — Z8673 Personal history of transient ischemic attack (TIA), and cerebral infarction without residual deficits: Secondary | ICD-10-CM | POA: Diagnosis not present

## 2022-01-18 DIAGNOSIS — I10 Essential (primary) hypertension: Secondary | ICD-10-CM | POA: Diagnosis not present

## 2022-01-18 DIAGNOSIS — F339 Major depressive disorder, recurrent, unspecified: Secondary | ICD-10-CM | POA: Diagnosis not present

## 2022-01-18 DIAGNOSIS — E118 Type 2 diabetes mellitus with unspecified complications: Secondary | ICD-10-CM | POA: Diagnosis not present

## 2022-01-18 DIAGNOSIS — R54 Age-related physical debility: Secondary | ICD-10-CM | POA: Diagnosis not present

## 2022-02-03 DIAGNOSIS — E1149 Type 2 diabetes mellitus with other diabetic neurological complication: Secondary | ICD-10-CM | POA: Diagnosis not present

## 2022-02-03 DIAGNOSIS — I10 Essential (primary) hypertension: Secondary | ICD-10-CM | POA: Diagnosis not present

## 2022-02-03 DIAGNOSIS — R54 Age-related physical debility: Secondary | ICD-10-CM | POA: Diagnosis not present

## 2022-02-03 DIAGNOSIS — F339 Major depressive disorder, recurrent, unspecified: Secondary | ICD-10-CM | POA: Diagnosis not present

## 2022-02-03 DIAGNOSIS — D649 Anemia, unspecified: Secondary | ICD-10-CM | POA: Diagnosis not present

## 2022-02-03 DIAGNOSIS — E785 Hyperlipidemia, unspecified: Secondary | ICD-10-CM | POA: Diagnosis not present

## 2022-02-11 DIAGNOSIS — Z681 Body mass index (BMI) 19 or less, adult: Secondary | ICD-10-CM | POA: Diagnosis not present

## 2022-02-11 DIAGNOSIS — E441 Mild protein-calorie malnutrition: Secondary | ICD-10-CM | POA: Diagnosis not present

## 2022-02-11 DIAGNOSIS — I10 Essential (primary) hypertension: Secondary | ICD-10-CM | POA: Diagnosis not present

## 2022-02-11 DIAGNOSIS — M059 Rheumatoid arthritis with rheumatoid factor, unspecified: Secondary | ICD-10-CM | POA: Diagnosis not present

## 2022-02-15 DIAGNOSIS — R54 Age-related physical debility: Secondary | ICD-10-CM | POA: Diagnosis not present

## 2022-02-15 DIAGNOSIS — F339 Major depressive disorder, recurrent, unspecified: Secondary | ICD-10-CM | POA: Diagnosis not present

## 2022-02-15 DIAGNOSIS — E119 Type 2 diabetes mellitus without complications: Secondary | ICD-10-CM | POA: Diagnosis not present

## 2022-02-15 DIAGNOSIS — I10 Essential (primary) hypertension: Secondary | ICD-10-CM | POA: Diagnosis not present

## 2022-03-10 DIAGNOSIS — R54 Age-related physical debility: Secondary | ICD-10-CM | POA: Diagnosis not present

## 2022-03-10 DIAGNOSIS — F339 Major depressive disorder, recurrent, unspecified: Secondary | ICD-10-CM | POA: Diagnosis not present

## 2022-03-10 DIAGNOSIS — E1149 Type 2 diabetes mellitus with other diabetic neurological complication: Secondary | ICD-10-CM | POA: Diagnosis not present

## 2022-03-10 DIAGNOSIS — M1990 Unspecified osteoarthritis, unspecified site: Secondary | ICD-10-CM | POA: Diagnosis not present

## 2022-03-10 DIAGNOSIS — E785 Hyperlipidemia, unspecified: Secondary | ICD-10-CM | POA: Diagnosis not present

## 2022-03-10 DIAGNOSIS — I10 Essential (primary) hypertension: Secondary | ICD-10-CM | POA: Diagnosis not present

## 2022-03-10 DIAGNOSIS — D649 Anemia, unspecified: Secondary | ICD-10-CM | POA: Diagnosis not present

## 2022-03-21 DIAGNOSIS — E119 Type 2 diabetes mellitus without complications: Secondary | ICD-10-CM | POA: Diagnosis not present

## 2022-03-21 DIAGNOSIS — I10 Essential (primary) hypertension: Secondary | ICD-10-CM | POA: Diagnosis not present

## 2022-03-21 DIAGNOSIS — F339 Major depressive disorder, recurrent, unspecified: Secondary | ICD-10-CM | POA: Diagnosis not present

## 2022-03-21 DIAGNOSIS — K219 Gastro-esophageal reflux disease without esophagitis: Secondary | ICD-10-CM | POA: Diagnosis not present

## 2022-03-21 DIAGNOSIS — M0579 Rheumatoid arthritis with rheumatoid factor of multiple sites without organ or systems involvement: Secondary | ICD-10-CM | POA: Diagnosis not present

## 2022-04-07 DIAGNOSIS — F339 Major depressive disorder, recurrent, unspecified: Secondary | ICD-10-CM | POA: Diagnosis not present

## 2022-04-07 DIAGNOSIS — R54 Age-related physical debility: Secondary | ICD-10-CM | POA: Diagnosis not present

## 2022-04-07 DIAGNOSIS — Z8673 Personal history of transient ischemic attack (TIA), and cerebral infarction without residual deficits: Secondary | ICD-10-CM | POA: Diagnosis not present

## 2022-04-07 DIAGNOSIS — I10 Essential (primary) hypertension: Secondary | ICD-10-CM | POA: Diagnosis not present

## 2022-04-07 DIAGNOSIS — R0602 Shortness of breath: Secondary | ICD-10-CM | POA: Diagnosis not present

## 2022-04-07 DIAGNOSIS — M546 Pain in thoracic spine: Secondary | ICD-10-CM | POA: Diagnosis not present

## 2022-04-07 DIAGNOSIS — R1084 Generalized abdominal pain: Secondary | ICD-10-CM | POA: Diagnosis not present

## 2022-04-07 DIAGNOSIS — K449 Diaphragmatic hernia without obstruction or gangrene: Secondary | ICD-10-CM | POA: Diagnosis not present

## 2022-04-07 DIAGNOSIS — E785 Hyperlipidemia, unspecified: Secondary | ICD-10-CM | POA: Diagnosis not present

## 2022-04-07 DIAGNOSIS — E1149 Type 2 diabetes mellitus with other diabetic neurological complication: Secondary | ICD-10-CM | POA: Diagnosis not present

## 2022-04-07 DIAGNOSIS — D649 Anemia, unspecified: Secondary | ICD-10-CM | POA: Diagnosis not present

## 2022-04-07 DIAGNOSIS — M1990 Unspecified osteoarthritis, unspecified site: Secondary | ICD-10-CM | POA: Diagnosis not present

## 2022-04-08 DIAGNOSIS — R079 Chest pain, unspecified: Secondary | ICD-10-CM | POA: Diagnosis not present

## 2022-04-08 DIAGNOSIS — R1084 Generalized abdominal pain: Secondary | ICD-10-CM | POA: Diagnosis not present

## 2022-04-11 ENCOUNTER — Emergency Department: Payer: PPO

## 2022-04-11 ENCOUNTER — Observation Stay
Admission: EM | Admit: 2022-04-11 | Discharge: 2022-04-12 | Disposition: A | Discharge status: Home / Self Care | Payer: PPO | Attending: Internal Medicine | Admitting: Internal Medicine

## 2022-04-11 ENCOUNTER — Other Ambulatory Visit: Payer: Self-pay

## 2022-04-11 DIAGNOSIS — I639 Cerebral infarction, unspecified: Secondary | ICD-10-CM | POA: Diagnosis not present

## 2022-04-11 DIAGNOSIS — K219 Gastro-esophageal reflux disease without esophagitis: Secondary | ICD-10-CM

## 2022-04-11 DIAGNOSIS — F32A Depression, unspecified: Secondary | ICD-10-CM | POA: Diagnosis present

## 2022-04-11 DIAGNOSIS — E119 Type 2 diabetes mellitus without complications: Secondary | ICD-10-CM | POA: Diagnosis not present

## 2022-04-11 DIAGNOSIS — R0602 Shortness of breath: Secondary | ICD-10-CM | POA: Diagnosis not present

## 2022-04-11 DIAGNOSIS — Z8673 Personal history of transient ischemic attack (TIA), and cerebral infarction without residual deficits: Secondary | ICD-10-CM | POA: Diagnosis not present

## 2022-04-11 DIAGNOSIS — Z9049 Acquired absence of other specified parts of digestive tract: Secondary | ICD-10-CM | POA: Diagnosis not present

## 2022-04-11 DIAGNOSIS — R52 Pain, unspecified: Secondary | ICD-10-CM | POA: Diagnosis not present

## 2022-04-11 DIAGNOSIS — K449 Diaphragmatic hernia without obstruction or gangrene: Secondary | ICD-10-CM

## 2022-04-11 DIAGNOSIS — M069 Rheumatoid arthritis, unspecified: Secondary | ICD-10-CM | POA: Insufficient documentation

## 2022-04-11 DIAGNOSIS — E118 Type 2 diabetes mellitus with unspecified complications: Secondary | ICD-10-CM | POA: Diagnosis present

## 2022-04-11 DIAGNOSIS — Z20822 Contact with and (suspected) exposure to covid-19: Secondary | ICD-10-CM | POA: Insufficient documentation

## 2022-04-11 DIAGNOSIS — I10 Essential (primary) hypertension: Secondary | ICD-10-CM | POA: Diagnosis present

## 2022-04-11 DIAGNOSIS — R0603 Acute respiratory distress: Secondary | ICD-10-CM

## 2022-04-11 DIAGNOSIS — R109 Unspecified abdominal pain: Secondary | ICD-10-CM | POA: Diagnosis not present

## 2022-04-11 DIAGNOSIS — J690 Pneumonitis due to inhalation of food and vomit: Principal | ICD-10-CM

## 2022-04-11 DIAGNOSIS — M0579 Rheumatoid arthritis with rheumatoid factor of multiple sites without organ or systems involvement: Secondary | ICD-10-CM | POA: Diagnosis present

## 2022-04-11 DIAGNOSIS — I700 Atherosclerosis of aorta: Secondary | ICD-10-CM | POA: Diagnosis not present

## 2022-04-11 DIAGNOSIS — J189 Pneumonia, unspecified organism: Secondary | ICD-10-CM | POA: Diagnosis not present

## 2022-04-11 DIAGNOSIS — R1084 Generalized abdominal pain: Secondary | ICD-10-CM | POA: Diagnosis not present

## 2022-04-11 DIAGNOSIS — R0600 Dyspnea, unspecified: Secondary | ICD-10-CM

## 2022-04-11 DIAGNOSIS — E46 Unspecified protein-calorie malnutrition: Secondary | ICD-10-CM

## 2022-04-11 DIAGNOSIS — R0689 Other abnormalities of breathing: Secondary | ICD-10-CM | POA: Diagnosis not present

## 2022-04-11 DIAGNOSIS — J80 Acute respiratory distress syndrome: Secondary | ICD-10-CM | POA: Diagnosis not present

## 2022-04-11 LAB — CBC WITH DIFFERENTIAL/PLATELET
Abs Immature Granulocytes: 0.02 10*3/uL (ref 0.00–0.07)
Basophils Absolute: 0 10*3/uL (ref 0.0–0.1)
Basophils Relative: 0 %
Eosinophils Absolute: 0 10*3/uL (ref 0.0–0.5)
Eosinophils Relative: 0 %
HCT: 34.1 % — ABNORMAL LOW (ref 36.0–46.0)
Hemoglobin: 11.1 g/dL — ABNORMAL LOW (ref 12.0–15.0)
Immature Granulocytes: 0 %
Lymphocytes Relative: 19 %
Lymphs Abs: 1.3 10*3/uL (ref 0.7–4.0)
MCH: 30.1 pg (ref 26.0–34.0)
MCHC: 32.6 g/dL (ref 30.0–36.0)
MCV: 92.4 fL (ref 80.0–100.0)
Monocytes Absolute: 0.4 10*3/uL (ref 0.1–1.0)
Monocytes Relative: 6 %
Neutro Abs: 5.2 10*3/uL (ref 1.7–7.7)
Neutrophils Relative %: 75 %
Platelets: 403 10*3/uL — ABNORMAL HIGH (ref 150–400)
RBC: 3.69 MIL/uL — ABNORMAL LOW (ref 3.87–5.11)
RDW: 14.6 % (ref 11.5–15.5)
WBC: 7.1 10*3/uL (ref 4.0–10.5)
nRBC: 0 % (ref 0.0–0.2)

## 2022-04-11 LAB — URINALYSIS, COMPLETE (UACMP) WITH MICROSCOPIC
Bacteria, UA: NONE SEEN
Bilirubin Urine: NEGATIVE
Glucose, UA: NEGATIVE mg/dL
Hgb urine dipstick: NEGATIVE
Ketones, ur: NEGATIVE mg/dL
Leukocytes,Ua: NEGATIVE
Nitrite: NEGATIVE
Protein, ur: NEGATIVE mg/dL
Specific Gravity, Urine: 1.034 — ABNORMAL HIGH (ref 1.005–1.030)
pH: 8 (ref 5.0–8.0)

## 2022-04-11 LAB — COMPREHENSIVE METABOLIC PANEL
ALT: 18 U/L (ref 0–44)
AST: 32 U/L (ref 15–41)
Albumin: 4.1 g/dL (ref 3.5–5.0)
Alkaline Phosphatase: 80 U/L (ref 38–126)
Anion gap: 12 (ref 5–15)
BUN: 18 mg/dL (ref 8–23)
CO2: 24 mmol/L (ref 22–32)
Calcium: 9.8 mg/dL (ref 8.9–10.3)
Chloride: 103 mmol/L (ref 98–111)
Creatinine, Ser: 0.81 mg/dL (ref 0.44–1.00)
GFR, Estimated: >60 mL/min (ref >60)
Glucose, Bld: 107 mg/dL — ABNORMAL HIGH (ref 70–99)
Potassium: 3.5 mmol/L (ref 3.5–5.1)
Sodium: 139 mmol/L (ref 135–145)
Total Bilirubin: 0.9 mg/dL (ref 0.3–1.2)
Total Protein: 7.3 g/dL (ref 6.5–8.1)

## 2022-04-11 LAB — BLOOD GAS, ARTERIAL
Acid-Base Excess: 2.4 mmol/L — ABNORMAL HIGH (ref 0.0–2.0)
Bicarbonate: 25 mmol/L (ref 20.0–28.0)
O2 Content: 2 L/min
O2 Saturation: 99.8 %
Patient temperature: 37
pCO2 arterial: 32 mmHg (ref 32–48)
pH, Arterial: 7.5 — ABNORMAL HIGH (ref 7.35–7.45)
pO2, Arterial: 158 mmHg — ABNORMAL HIGH (ref 83–108)

## 2022-04-11 LAB — TROPONIN I (HIGH SENSITIVITY)
Troponin I (High Sensitivity): 8 ng/L (ref <18)
Troponin I (High Sensitivity): 7 ng/L (ref <18)

## 2022-04-11 LAB — LIPASE, BLOOD: Lipase: 37 U/L (ref 11–51)

## 2022-04-11 LAB — SARS CORONAVIRUS 2 BY RT PCR: SARS Coronavirus 2 by RT PCR: NEGATIVE

## 2022-04-11 MED ORDER — ONDANSETRON HCL 4 MG/2ML IJ SOLN
4.0000 mg | Freq: Once | INTRAMUSCULAR | Status: AC | PRN
  Administered 2022-04-11: 4 mg via INTRAVENOUS
  Filled 2022-04-11: qty 2

## 2022-04-11 MED ORDER — SERTRALINE HCL 50 MG PO TABS
50.0000 mg | ORAL_TABLET | Freq: Every day | ORAL | Status: DC
  Administered 2022-04-12: 50 mg via ORAL
  Filled 2022-04-11: qty 1

## 2022-04-11 MED ORDER — SODIUM CHLORIDE 0.9 % IV SOLN
3.0000 g | Freq: Four times a day (QID) | INTRAVENOUS | Status: DC
  Administered 2022-04-12 (×2): 3 g via INTRAVENOUS
  Filled 2022-04-11 (×2): qty 8

## 2022-04-11 MED ORDER — MIRTAZAPINE 15 MG PO TABS
15.0000 mg | ORAL_TABLET | Freq: Every day | ORAL | Status: DC
  Administered 2022-04-12: 15 mg via ORAL
  Filled 2022-04-11: qty 1

## 2022-04-11 MED ORDER — ENOXAPARIN SODIUM 40 MG/0.4ML IJ SOSY
40.0000 mg | PREFILLED_SYRINGE | INTRAMUSCULAR | Status: DC
Start: 2022-04-11 — End: 2022-04-12
  Administered 2022-04-11: 40 mg via SUBCUTANEOUS
  Filled 2022-04-11: qty 0.4

## 2022-04-11 MED ORDER — BISACODYL 10 MG RE SUPP
10.0000 mg | Freq: Once | RECTAL | Status: AC
  Administered 2022-04-11: 10 mg via RECTAL
  Filled 2022-04-11: qty 1

## 2022-04-11 MED ORDER — SODIUM CHLORIDE 0.9 % IV SOLN: INTRAVENOUS | Status: AC

## 2022-04-11 MED ORDER — FOLIC ACID 1 MG PO TABS
1.0000 mg | ORAL_TABLET | Freq: Every day | ORAL | Status: DC
  Administered 2022-04-12: 1 mg via ORAL
  Filled 2022-04-11: qty 1

## 2022-04-11 MED ORDER — TIMOLOL MALEATE 0.5 % OP SOLN
1.0000 [drp] | Freq: Two times a day (BID) | OPHTHALMIC | Status: DC
  Filled 2022-04-11: qty 5

## 2022-04-11 MED ORDER — VITAMIN B-12 1000 MCG PO TABS
1000.0000 ug | ORAL_TABLET | Freq: Every day | ORAL | Status: DC
  Administered 2022-04-12: 1000 ug via ORAL
  Filled 2022-04-11: qty 1

## 2022-04-11 MED ORDER — TIZANIDINE HCL 2 MG PO TABS: 4.0000 mg | ORAL_TABLET | Freq: Three times a day (TID) | ORAL | Status: DC | PRN

## 2022-04-11 MED ORDER — TRAMADOL HCL 50 MG PO TABS: 50.0000 mg | ORAL_TABLET | Freq: Every day | ORAL | Status: DC | PRN

## 2022-04-11 MED ORDER — ONDANSETRON HCL 4 MG PO TABS: 4.0000 mg | ORAL_TABLET | Freq: Four times a day (QID) | ORAL | Status: DC | PRN

## 2022-04-11 MED ORDER — VITAMIN D 25 MCG (1000 UNIT) PO TABS
1000.0000 [IU] | ORAL_TABLET | Freq: Every day | ORAL | Status: DC
Start: 2022-04-12 — End: 2022-04-12
  Administered 2022-04-12: 1000 [IU] via ORAL
  Filled 2022-04-11: qty 1

## 2022-04-11 MED ORDER — PANTOPRAZOLE SODIUM 40 MG PO TBEC
40.0000 mg | DELAYED_RELEASE_TABLET | Freq: Every day | ORAL | Status: DC
  Administered 2022-04-12: 40 mg via ORAL
  Filled 2022-04-11: qty 1

## 2022-04-11 MED ORDER — SODIUM CHLORIDE 0.9 % IV BOLUS
1000.0000 mL | Freq: Once | INTRAVENOUS | Status: AC
  Administered 2022-04-11: 1000 mL via INTRAVENOUS

## 2022-04-11 MED ORDER — SPIRONOLACTONE 25 MG PO TABS
25.0000 mg | ORAL_TABLET | Freq: Every day | ORAL | Status: DC
  Administered 2022-04-12: 25 mg via ORAL
  Filled 2022-04-11: qty 1

## 2022-04-11 MED ORDER — ATORVASTATIN CALCIUM 20 MG PO TABS
80.0000 mg | ORAL_TABLET | Freq: Every day | ORAL | Status: DC
  Administered 2022-04-12 (×2): 80 mg via ORAL
  Filled 2022-04-11 (×2): qty 4

## 2022-04-11 MED ORDER — LATANOPROST 0.005 % OP SOLN
1.0000 [drp] | Freq: Every day | OPHTHALMIC | Status: DC
Start: 2022-04-12 — End: 2022-04-12
  Filled 2022-04-11: qty 2.5

## 2022-04-11 MED ORDER — ACETAMINOPHEN 325 MG PO TABS
650.0000 mg | ORAL_TABLET | Freq: Four times a day (QID) | ORAL | Status: DC | PRN
  Administered 2022-04-12: 650 mg via ORAL
  Filled 2022-04-11: qty 2

## 2022-04-11 MED ORDER — DULOXETINE HCL 30 MG PO CPEP
30.0000 mg | ORAL_CAPSULE | Freq: Every day | ORAL | Status: DC
  Administered 2022-04-12: 30 mg via ORAL
  Filled 2022-04-11: qty 1

## 2022-04-11 MED ORDER — OYSTER SHELL CALCIUM/D3 500-5 MG-MCG PO TABS
1.0000 | ORAL_TABLET | Freq: Every day | ORAL | Status: DC
  Administered 2022-04-12: 1 via ORAL
  Filled 2022-04-11: qty 1

## 2022-04-11 MED ORDER — GABAPENTIN 100 MG PO CAPS
200.0000 mg | ORAL_CAPSULE | Freq: Two times a day (BID) | ORAL | Status: DC
  Administered 2022-04-12 (×2): 200 mg via ORAL
  Filled 2022-04-11 (×2): qty 2

## 2022-04-11 MED ORDER — METHOTREXATE 2.5 MG PO TABS: 12.5000 mg | ORAL_TABLET | ORAL | Status: DC

## 2022-04-11 MED ORDER — ONDANSETRON HCL 4 MG/2ML IJ SOLN: 4.0000 mg | Freq: Four times a day (QID) | INTRAMUSCULAR | Status: DC | PRN

## 2022-04-11 MED ORDER — SODIUM CHLORIDE 0.9 % IV SOLN
3.0000 g | Freq: Once | INTRAVENOUS | Status: AC
  Administered 2022-04-11: 3 g via INTRAVENOUS
  Filled 2022-04-11: qty 8

## 2022-04-11 MED ORDER — MORPHINE SULFATE (PF) 4 MG/ML IV SOLN
4.0000 mg | Freq: Once | INTRAVENOUS | Status: AC
  Administered 2022-04-11: 4 mg via INTRAVENOUS
  Filled 2022-04-11: qty 1

## 2022-04-11 MED ORDER — ASPIRIN 81 MG PO TBEC
81.0000 mg | DELAYED_RELEASE_TABLET | Freq: Every day | ORAL | Status: DC
  Administered 2022-04-12: 81 mg via ORAL
  Filled 2022-04-11: qty 1

## 2022-04-11 MED ORDER — IOHEXOL 350 MG/ML SOLN
75.0000 mL | Freq: Once | INTRAVENOUS | Status: AC | PRN
  Administered 2022-04-11: 75 mL via INTRAVENOUS

## 2022-04-11 MED ORDER — SODIUM CHLORIDE 0.9 % IV SOLN
500.0000 mg | Freq: Once | INTRAVENOUS | Status: AC
  Administered 2022-04-11: 500 mg via INTRAVENOUS
  Filled 2022-04-11: qty 5

## 2022-04-11 NOTE — Assessment & Plan Note (Signed)
Continue aspirin and atorvastatin. ?

## 2022-04-11 NOTE — Assessment & Plan Note (Signed)
Blood pressure is stable Continue spironolactone 

## 2022-04-11 NOTE — ED Notes (Signed)
Called CT to come for pt. Provider spoke with CT on phone, CR not back but labs have been sent.

## 2022-04-11 NOTE — ED Provider Triage Note (Signed)
  Emergency Medicine Provider Triage Evaluation Note  Wanda Monroe , a 81 y.o.female,  was evaluated in triage.  Pt complains of lower abdominal pain, right side started earlier today.  She is additionally endorsing numbness/tingling in her lower extremities, as well as shortness of breath that is reportedly been going on for a couple weeks.   Review of Systems  Positive: Abdominal pain, notes itching in lower extremities, right lower quadrant abdominal pain Negative: Denies fever, chest pain, vomiting  Physical Exam   Vitals:   04/11/22 1726  BP: (!) 166/78  Pulse: 82  Resp: (!) 32  Temp: 98.3 F (36.8 C)  SpO2: 100%   Gen:   Awake, appears very anxious Resp:  Labored breathing. MSK:   Moves extremities without difficulty  Other:  Tenderness appreciated in the mid and right lower quadrant  Medical Decision Making  Given the patient's initial medical screening exam, the following diagnostic evaluation has been ordered. The patient will be placed in the appropriate treatment space, once one is available, to complete the evaluation and treatment. I have discussed the plan of care with the patient and I have advised the patient that an ED physician or mid-level practitioner will reevaluate their condition after the test results have been received, as the results may give them additional insight into the type of treatment they may need.    Diagnostics: Labs, CXR, UA, abdominal CT  Treatments: none immediately   Teodoro Spray, [place] 04/11/22 1730

## 2022-04-11 NOTE — Assessment & Plan Note (Signed)
Stable Continue sertraline, mirtazapine and Cymbalta

## 2022-04-11 NOTE — Assessment & Plan Note (Signed)
Patient presents for evaluation of worsening shortness of breath from her baseline and imaging shows findings concerning for aspiration pneumonia. Risk factors for aspiration include history of prior stroke and possible microaspiration from large hiatal hernia Speech therapy evaluation Aspiration precautions Place patient on Unasyn

## 2022-04-11 NOTE — ED Triage Notes (Addendum)
See first nurse note. Pt has had SOB ongoing due to "hernia in chest" with worsening SOB since 5 days ago. Pt also has pain to RLQ abdomen through to back, guarding upon light palpation by PA. Marland Kitchen Also feels weak. Respirations are labored, pt is tachypneic with RR in 51s.  Pt also complains of blurry vision all day since this morning.

## 2022-04-11 NOTE — Assessment & Plan Note (Signed)
Patient has a known history of a large hiatal hernia with GERD Head of bed elevated Aspiration precautions Continue Protonix

## 2022-04-11 NOTE — ED Provider Notes (Signed)
[facility] Provider Note   Event Date/Time   First MD Initiated Contact with Patient 04/11/22 1836     (approximate) History  Shortness of Breath, Weakness, and Abdominal Pain  HPI MANASVINI Wanda Monroe is a 81 y.o. female with a stated past medical history of hiatal hernia presents for shortness of breath that is been present over the last 2 weeks and severely worsened today.  Also complains of generalized weakness as well as right lower quadrant abdominal pain.  Patient does state that she has a history of constipation and last bowel movement was 3 days prior to arrival.  Patient does also endorse cough productive of yellow sputum that has worsened over the last 2 days.  Patient endorses severe GERD symptoms as well. ROS: Patient currently denies any vision changes, tinnitus, difficulty speaking, facial droop, sore throat, nausea/vomiting/diarrhea, dysuria, or weakness/numbness/paresthesias in any extremity   Physical Exam  Triage Vital Signs: ED Triage Vitals  Enc Vitals Group     BP 04/11/22 1726 (!) 166/78     Pulse Rate 04/11/22 1726 82     Resp 04/11/22 1726 (!) 32     Temp 04/11/22 1726 98.3 F (36.8 C)     Temp Source 04/11/22 1726 Oral     SpO2 04/11/22 1726 100 %     Weight 04/11/22 1729 107 lb (48.5 kg)     Height 04/11/22 1729 '5\' 7"'$  (1.702 m)     Head Circumference --      Peak Flow --      Pain Score 04/11/22 1728 10     Pain Loc --      Pain Edu? --      Excl. in [place]? --    Most recent vital signs: Vitals:   04/11/22 2000 04/11/22 2146  BP: (!) 151/136   Pulse: 87   Resp: (!) 27   Temp:  98.6 F (37 C)  SpO2: 100%    General: Awake, oriented x4. CV:  Good peripheral perfusion. Resp:  Increased effort.  Tachypnea Abd:  No distention.  Other:  Elderly Caucasian female laying in bed in mild respiratory distress ED Results / Procedures / Treatments  Labs (all labs ordered are listed, but only abnormal results are displayed) Labs Reviewed   COMPREHENSIVE METABOLIC PANEL - Abnormal; Notable for the following components:      Result Value   Glucose, Bld 107 (*)    All other components within normal limits  CBC WITH DIFFERENTIAL/PLATELET - Abnormal; Notable for the following components:   RBC 3.69 (*)    Hemoglobin 11.1 (*)    HCT 34.1 (*)    Platelets 403 (*)    All other components within normal limits  BLOOD GAS, ARTERIAL - Abnormal; Notable for the following components:   pH, Arterial 7.5 (*)    pO2, Arterial 158 (*)    Acid-Base Excess 2.4 (*)    All other components within normal limits  URINALYSIS, COMPLETE (UACMP) WITH MICROSCOPIC - Abnormal; Notable for the following components:   Color, Urine STRAW (*)    APPearance CLEAR (*)    Specific Gravity, Urine 1.034 (*)    All other components within normal limits  SARS CORONAVIRUS 2 BY RT PCR  LIPASE, BLOOD  TROPONIN I (HIGH SENSITIVITY)  TROPONIN I (HIGH SENSITIVITY)   EKG ED ECG REPORT I, Naaman Plummer, the attending physician, personally viewed and interpreted this ECG. Date: 04/11/2022 EKG Time: 1743 Rate: 84 Rhythm: normal sinus rhythm QRS  Axis: normal Intervals: normal ST/T Wave abnormalities: normal Narrative Interpretation: no evidence of acute ischemia RADIOLOGY ED MD interpretation: CT angiography of the chest interpreted by me and shows no evidence of pulmonary embolus but does show a large hiatal hernia with clustered centrilobular nodules on the right middle lobe and right upper lobe concerning for infection or aspiration -Agree with radiology assessment Official radiology report(s): CT Angio Chest PE W and/or Wo Contrast  Result Date: 04/11/2022 CLINICAL DATA:  Shortness of breath EXAM: CT ANGIOGRAPHY CHEST WITH CONTRAST TECHNIQUE: Multidetector CT imaging of the chest was performed using the standard protocol during bolus administration of intravenous contrast. Multiplanar CT image reconstructions and MIPs were obtained to evaluate the  vascular anatomy. RADIATION DOSE REDUCTION: This exam was performed according to the departmental dose-optimization program which includes automated exposure control, adjustment of the mA and/or kV according to patient size and/or use of iterative reconstruction technique. CONTRAST:  56m OMNIPAQUE IOHEXOL 350 MG/ML SOLN COMPARISON:  None Available. FINDINGS: Cardiovascular: Normal heart size. No pericardial effusion. Normal caliber thoracic aorta with moderate atherosclerotic disease. Mediastinum/Nodes: Large hiatal hernia. No pathologically enlarged lymph nodes seen in the chest. Lungs/Pleura: Central airways are patent. Clustered centrilobular nodules of the right middle lobe and right upper lobe, largest measures 4 mm on series 6, image 47. No consolidation, pleural effusion or pneumothorax. Upper Abdomen: No acute abnormality. Musculoskeletal: No chest wall abnormality. No acute or significant osseous findings. Review of the MIP images confirms the above findings. IMPRESSION: 1. No evidence of pulmonary embolus. 2. Large hiatal hernia. 3. Clustered centrilobular nodules of the right middle lobe and right upper lobe, largest measures 4 mm, likely due to infection or aspiration. No follow-up needed if patient is low-risk (and has no known or suspected primary neoplasm). Non-contrast chest CT can be considered in 12 months if patient is high-risk. This recommendation follows the consensus statement: Guidelines for Management of Incidental Pulmonary Nodules Detected on CT Images: From the Fleischner Society 2017; Radiology 2017; 284:228-243. 4. Aortic Atherosclerosis (ICD10-I70.0). Electronically Signed   By: LYetta GlassmanM.D.   On: 04/11/2022 18:13   CT Abdomen Pelvis W Contrast  Result Date: 04/11/2022 CLINICAL DATA:  Right lower quadrant abdominal pain. Shortness of breath. EXAM: CT ABDOMEN AND PELVIS WITH CONTRAST TECHNIQUE: Multidetector CT imaging of the abdomen and pelvis was performed using the  standard protocol following bolus administration of intravenous contrast. RADIATION DOSE REDUCTION: This exam was performed according to the departmental dose-optimization program which includes automated exposure control, adjustment of the mA and/or kV according to patient size and/or use of iterative reconstruction technique. CONTRAST:  749mOMNIPAQUE IOHEXOL 350 MG/ML SOLN COMPARISON:  CT 12/23/2016 FINDINGS: Lower chest: Large hiatal hernia as seen previously. Lung bases are clear. Hepatobiliary: Liver parenchyma is normal.  No calcified gallstones. Pancreas: Normal Spleen: Spleen upper limits of normal in size, slightly enlarged since the prior study. Adrenals/Urinary Tract: Adrenal glands are normal. Kidneys are normal. No cyst, mass, stone or hydronephrosis. Bladder is normal. Stomach/Bowel: Hiatal hernia as noted above. No small bowel abnormality is seen. No visible appendix. No inflammatory change noted in the right lower quadrant. Patient does have a large amount of stool throughout the colon. No evidence of diverticulosis or diverticulitis. Vascular/Lymphatic: Aortic atherosclerosis. No aneurysm. IVC is normal. No adenopathy. Reproductive: Previous hysterectomy.  No pelvic mass. Other: No free fluid or air. Musculoskeletal: Scoliosis and degenerative change of the spine. IMPRESSION: Previous appendectomy by history. No inflammatory change seen in the right lower quadrant. Large  amount of stool throughout the colon. No evidence of diverticulosis or diverticulitis. Large hiatal hernia.  No acute feature. Aortic atherosclerosis. Electronically Signed   By: Nelson Chimes M.D.   On: 04/11/2022 18:09   PROCEDURES: Critical Care performed: Yes, see critical care procedure note(s) .1-3 Lead EKG Interpretation  Performed by: Naaman Plummer, MD Authorized by: Naaman Plummer, MD     Interpretation: normal     ECG rate:  89   ECG rate assessment: normal     Rhythm: sinus rhythm     Ectopy: none      Conduction: normal   CRITICAL CARE Performed by: Naaman Plummer  Total critical care time: 31 minutes  Critical care time was exclusive of separately billable procedures and treating other patients.  Critical care was necessary to treat or prevent imminent or life-threatening deterioration.  Critical care was time spent personally by me on the following activities: development of treatment plan with patient and/or surrogate as well as nursing, discussions with consultants, evaluation of patient's response to treatment, examination of patient, obtaining history from patient or surrogate, ordering and performing treatments and interventions, ordering and review of laboratory studies, ordering and review of radiographic studies, pulse oximetry and re-evaluation of patient's condition.  MEDICATIONS ORDERED IN ED: Medications  acetaminophen (TYLENOL) tablet 650 mg (has no administration in time range)  aspirin EC tablet 81 mg (has no administration in time range)  methotrexate (RHEUMATREX) tablet 12.5 mg (has no administration in time range)  traMADol (ULTRAM) tablet 50 mg (has no administration in time range)  atorvastatin (LIPITOR) tablet 80 mg (has no administration in time range)  spironolactone (ALDACTONE) tablet 25 mg (has no administration in time range)  DULoxetine (CYMBALTA) DR capsule 30 mg (has no administration in time range)  mirtazapine (REMERON) tablet 15 mg (has no administration in time range)  sertraline (ZOLOFT) tablet 50 mg (has no administration in time range)  pantoprazole (PROTONIX) EC tablet 40 mg (has no administration in time range)  folic acid (FOLVITE) tablet 1 mg (has no administration in time range)  cyanocobalamin (VITAMIN B12) tablet 1,000 mcg (has no administration in time range)  gabapentin (NEURONTIN) capsule 200 mg (has no administration in time range)  tiZANidine (ZANAFLEX) tablet 4 mg (has no administration in time range)  calcium-vitamin D (OSCAL WITH D)  500-5 MG-MCG per tablet 1 tablet (has no administration in time range)  cholecalciferol (VITAMIN D3) 25 MCG (1000 UNIT) tablet 1,000 Units (has no administration in time range)  latanoprost (XALATAN) 0.005 % ophthalmic solution 1 drop (has no administration in time range)  timolol (TIMOPTIC) 0.5 % ophthalmic solution 1 drop (has no administration in time range)  enoxaparin (LOVENOX) injection 40 mg (40 mg Subcutaneous Given 04/11/22 2213)  0.9 %  sodium chloride infusion ( Intravenous New Bag/Given 04/11/22 2212)  ondansetron (ZOFRAN) tablet 4 mg (has no administration in time range)    Or  ondansetron (ZOFRAN) injection 4 mg (has no administration in time range)  Ampicillin-Sulbactam (UNASYN) 3 g in sodium chloride 0.9 % 100 mL IVPB (has no administration in time range)  iohexol (OMNIPAQUE) 350 MG/ML injection 75 mL (75 mLs Intravenous Contrast Given 04/11/22 1753)  sodium chloride 0.9 % bolus 1,000 mL (0 mLs Intravenous Stopped 04/11/22 2041)  ondansetron (ZOFRAN) injection 4 mg (4 mg Intravenous Given 04/11/22 1928)  morphine (PF) 4 MG/ML injection 4 mg (4 mg Intravenous Given 04/11/22 1928)  Ampicillin-Sulbactam (UNASYN) 3 g in sodium chloride 0.9 % 100 mL IVPB (0  g Intravenous Stopped 04/11/22 2041)  azithromycin (ZITHROMAX) 500 mg in sodium chloride 0.9 % 250 mL IVPB (0 mg Intravenous Stopped 04/11/22 2141)  bisacodyl (DULCOLAX) suppository 10 mg (10 mg Rectal Given 04/11/22 2214)   IMPRESSION / MDM / ASSESSMENT AND PLAN / ED COURSE  I reviewed the triage vital signs and the nursing notes.                             The patient is on the cardiac monitor to evaluate for evidence of arrhythmia and/or significant heart rate changes. Patient's presentation is most consistent with acute presentation with potential threat to life or bodily function. Presents with shortness of breath, cough, and malaise concerning for pneumonia.  DDx: PE, COPD exacerbation, Pneumothorax, TB, Atypical ACS, Esophageal  Rupture, Toxic Exposure, Foreign Body Airway Obstruction.  Workup: CXR CBC, CMP, lactate, troponin  Given History, Exam, and Workup presentation most consistent with pneumonia.  Findings: CT angiography of the chest shows central pulmonary nodules in the right lung field concerning for aspiration pneumonia  Tx: Unasyn Azithromycin '500mg'$  IV  2019 Reassessment: As patient is continuing to require supplemental oxygenation for acute respiratory distress, patient will require admission to the internal medicine service for further evaluation and management  Disposition: Admit   FINAL CLINICAL IMPRESSION(S) / ED DIAGNOSES   Final diagnoses:  Aspiration pneumonia of right middle lobe due to gastric secretions ([place])  Dyspnea, unspecified type   Rx / DC Orders   ED Discharge Orders     None      Note:  This document was prepared using Dragon voice recognition software and may include unintentional dictation errors.   Naaman Plummer, MD 04/11/22 [phone]

## 2022-04-11 NOTE — Assessment & Plan Note (Signed)
Stable Continue methotrexate and folic acid

## 2022-04-11 NOTE — Assessment & Plan Note (Signed)
Hold metformin Blood sugar checks every 4 hours while patient is n.p.o. for swallow function evaluation

## 2022-04-11 NOTE — Assessment & Plan Note (Addendum)
Unknown severity Patient has a BMI of 16.76 kg/m2 She has temporal wasting, prominent clavicles and ribs. We will request dietitian evaluation during this hospitalization We will start patient on nutritional supplements after she has been evaluated by speech therapy

## 2022-04-11 NOTE — ED Triage Notes (Signed)
First nurse note: Patient arrived by [facility] from home for sob. Reports lower abdominal pain. Grimace on face with palpation. Hx hernia in esophagus and appendectomy  EMS vitals  RR 30s-40s 98% Ra 100HR 152/62 blood pressure

## 2022-04-11 NOTE — H&P (Addendum)
History and Physical    Patient: Wanda Monroe SPQ:[id number] DOB: 1941/07/14 DOA: 04/11/2022 DOS: the patient was seen and examined on 04/11/2022 PCP: Pcp, No  Patient coming from: Home  Chief Complaint:  Chief Complaint  Patient presents with   Shortness of Breath   Weakness   Abdominal Pain   HPI: Wanda Monroe is a 81 y.o. female with medical history significant for rheumatoid arthritis, lumbar spinal stenosis, hiatal hernia with GERD, type 2 diabetes mellitus, hypertension, anemia of chronic disease, history of CVA who presents to the ER via EMS for evaluation of shortness of breath. Per patient she has had shortness of breath for over 1 year but it got worse on the day of admission.  Shortness of breath is not related to rest or exertion.  She denies having any cough, no fever, no chills, no nausea or vomiting.  She has a history of hiatal hernia and has chronic epigastric pain as well as reflux symptoms.  She also complains of dysphagia mostly with solids and thinks that her symptoms have worsened over the last couple of weeks. She also complains of pain in the right lower quadrant which she rates a 6 x 10 in intensity at its worst.  Pain is nonradiating and is associated with constipation which she states is chronic. She denies having any chest pain, no blurred vision, no focal deficit, no headache, no leg swelling, no dizziness, no lightheadedness, no focal deficit. She had a CT angiogram done to rule out PE which showed Clustered centrilobular nodules of the right middle lobe and right upper lobe, largest measures 4 mm, likely due to infection or aspiration.  She received a dose of Unasyn and will be admitted to the hospital for further evaluation.    Review of Systems: As mentioned in the history of present illness. All other systems reviewed and are negative. Past Medical History:  Diagnosis Date   Anemia    Arthritis    rhuematoid,OSTEOARTHRITIS   Cellulitis    Diabetes  mellitus without complication (HCC)    type II   GERD (gastroesophageal reflux disease)    Gout    Hiatal hernia    Hyperlipidemia    Hypertension    Neuromuscular disorder ([place])    left foot neuropathy   Osteoporosis    Rosacea    Spinal stenosis of lumbar region    Stroke [facility])    x2, 9/10   Temporal arteritis ([place])    Wears dentures    full top, partial bottom   Past Surgical History:  Procedure Laterality Date   ABDOMINAL HYSTERECTOMY     APPENDECTOMY     BACK SURGERY     lumbar and cervical laminectomies   BREAST BIOPSY Left 1960,1980   twice negative results   CAPSULOTOMY METATARSOPHALANGEAL Left 01/22/2015   Procedure: CAPSULOTOMY METATARSOPHALANGEAL;  Surgeon: Albertine Patricia, DPM;  Location: [facility];  Service: Podiatry;  Laterality: Left;   CATARACT EXTRACTION W/PHACO Left 10/19/2016   Procedure: CATARACT EXTRACTION PHACO AND INTRAOCULAR LENS PLACEMENT (IOC)  Left Eye  Diabetic;  Surgeon: Leandrew Koyanagi, MD;  Location: [place];  Service: Ophthalmology;  Laterality: Left;  Diabetic - oral meds Left Eye   CATARACT EXTRACTION W/PHACO Right 11/16/2016   Procedure: CATARACT EXTRACTION PHACO AND INTRAOCULAR LENS PLACEMENT (IOC) dIABETIC;  Surgeon: Leandrew Koyanagi, MD;  Location: [place];  Service: Ophthalmology;  Laterality: Right;  DIABETES - oral meds   COLONOSCOPY WITH PROPOFOL N/A 04/24/2018   Procedure: COLONOSCOPY  WITH PROPOFOL;  Surgeon: Toledo, Benay Pike, MD;  Location: [facility];  Service: Gastroenterology;  Laterality: N/A;   ESOPHAGOGASTRODUODENOSCOPY N/A 07/01/2021   Procedure: ESOPHAGOGASTRODUODENOSCOPY (EGD);  Surgeon: Benjamine Sprague, DO;  Location: [facility];  Service: General;  Laterality: N/A;   ESOPHAGOGASTRODUODENOSCOPY (EGD) WITH PROPOFOL N/A 04/24/2018   Procedure: ESOPHAGOGASTRODUODENOSCOPY (EGD) WITH PROPOFOL;  Surgeon: Toledo, Benay Pike, MD;  Location: [facility];  Service: Gastroenterology;   Laterality: N/A;   FOOT SURGERY     HALLUX VALGUS AKIN Left 01/22/2015   Procedure: HALLUX VALGUS AKIN;  Surgeon: Albertine Patricia, DPM;  Location: [place];  Service: Podiatry;  Laterality: Left;  left great toe   HEMORRHOID SURGERY     KNEE ARTHROSCOPY     LUMBAR LAMINECTOMY  04/23/13   NOSE SURGERY     TONSILLECTOMY     URETER SURGERY     Social History:  reports that she has never smoked. She has never used smokeless tobacco. She reports that she does not currently use drugs. She reports that she does not drink alcohol.  Allergies  Allergen Reactions   Codeine Sulfate Nausea Only   Remicade [Infliximab] Other (See Comments)    Infection    Family History  Problem Relation Age of Onset   Diabetes Father    Stroke Father    Breast cancer Neg Hx     Prior to Admission medications   Medication Sig Start Date End Date Taking? Authorizing Provider  acetaminophen (TYLENOL) 650 MG CR tablet Take 650 mg by mouth every 8 (eight) hours as needed for pain.    [provider]  aspirin 81 MG tablet Take 81 mg by mouth daily. AM    [provider]  atorvastatin (LIPITOR) 80 MG tablet Take 80 mg by mouth daily. PM    [provider]  calcium citrate-vitamin D (CITRACAL+D) 315-200 MG-UNIT per tablet Take 1 tablet by mouth daily.    [provider]  Cholecalciferol (VITAMIN D-3) 25 MCG (1000 UT) CAPS Take 1,000 mg by mouth daily.    [provider]  DULoxetine (CYMBALTA) 30 MG capsule Take 30 mg by mouth daily. 07/07/21   [provider]  folic acid (FOLVITE) 1 MG tablet Take 1 mg by mouth daily.    [provider]  gabapentin (NEURONTIN) 100 MG capsule Take 200 mg by mouth 2 (two) times daily.    [provider]  hydrochlorothiazide (HYDRODIURIL) 25 MG tablet Take 25 mg by mouth daily.    [provider]  latanoprost (XALATAN) 0.005 % ophthalmic solution Place 1 drop into both eyes at bedtime.     [provider]  metFORMIN (GLUCOPHAGE) 500 MG tablet Take 500 mg by mouth 2 (two) times daily with a meal.    [provider]  methotrexate (RHEUMATREX) 2.5 MG tablet Take 12.5 mg by mouth every Thursday.    [provider]  mirtazapine (REMERON) 15 MG tablet Take 15 mg by mouth at bedtime. 08/09/21   [provider]  pantoprazole (PROTONIX) 40 MG tablet Take 40 mg by mouth daily. 07/07/21   [provider]  Potassium Chloride ER 20 MEQ TBCR Take 1 tablet by mouth 2 (two) times daily. 02/19/22   [provider]  sertraline (ZOLOFT) 50 MG tablet Take 50 mg by mouth at bedtime.    [provider]  spironolactone (ALDACTONE) 25 MG tablet Take 25 mg by mouth daily.    [provider]  timolol (TIMOPTIC) 0.5 % ophthalmic solution  Place 1 drop into both eyes 2 (two) times daily.    [provider]  tiZANidine (ZANAFLEX) 4 MG tablet Take 4 mg by mouth 3 (three) times daily as needed for muscle spasms. 06/22/21   [provider]  traMADol (ULTRAM) 50 MG tablet Take 50 mg by mouth daily as needed for moderate pain. 07/27/21   [provider]  vitamin B-12 (CYANOCOBALAMIN) 1000 MCG tablet Take 1,000 mcg by mouth daily.    [provider]    Physical Exam: Vitals:   04/11/22 1726 04/11/22 1729 04/11/22 1900 04/11/22 2000  BP: (!) 166/78  (!) 140/110 (!) 151/136  Pulse: 82  87 87  Resp: (!) 32  (!) 32 (!) 27  Temp: 98.3 F (36.8 C)     TempSrc: Oral     SpO2: 100%  100% 100%  Weight:  48.5 kg    Height:  '5\' 7"'$  (1.702 m)     Physical Exam Vitals and nursing note reviewed.  Constitutional:      Comments: Thin and frail.  Noted to be in mild respiratory distress  HENT:     Head: Normocephalic and atraumatic.  Eyes:     Pupils: Pupils are equal, round, and reactive to light.  Cardiovascular:     Rate and Rhythm: Normal rate and regular rhythm.  Pulmonary:     Effort: Tachypnea present.      Breath sounds: Examination of the right-middle field reveals rhonchi. Rhonchi present.  Abdominal:     General: Bowel sounds are normal.     Palpations: Abdomen is soft.     Tenderness: There is abdominal tenderness.     Comments: Right lower quadrant and suprapubic  Musculoskeletal:        General: Normal range of motion.     Cervical back: Normal range of motion and neck supple.  Skin:    General: Skin is warm and dry.  Neurological:     General: No focal deficit present.  Psychiatric:        Mood and Affect: Mood normal.        Behavior: Behavior normal.     Data Reviewed: Relevant notes from primary care and specialist visits, past discharge summaries as available in EHR, including Care Everywhere. Prior diagnostic testing as pertinent to current admission diagnoses Updated medications and problem lists for reconciliation ED course, including vitals, labs, imaging, treatment and response to treatment Triage notes, nursing and pharmacy notes and ED provider's notes Notable results as noted in HPI Labs reviewed.  White count 7.1, hemoglobin 11.1, hematocrit 34, platelet count 403, lipase 37, sodium 139, potassium 3.5, chloride 103, bicarb 24, glucose 107, BUN 18, creatinine 0.81, calcium 9.8, total protein 7.3, albumin 4.1, AST 32, ALT 18, alkaline phosphatase 80, total bilirubin 0.9, troponin 8 >> 7 CT scan of abdomen and pelvis shows previous appendectomy by history. No inflammatory change seen in the right lower quadrant. Large amount of stool throughout the colon. No evidence of diverticulosis or diverticulitis. Large hiatal hernia.  Aortic atherosclerosis. CT angiogram PE shows no evidence of pulmonary embolus. Large hiatal hernia.Clustered centrilobular nodules of the right middle lobe and right upper lobe, largest measures 4 mm, likely due to infection or aspiration. No follow-up needed if patient is low-risk (and has no known or suspected primary neoplasm). Non-contrast  chest CT can be considered in 12 months if patient is high-risk.Aortic Atherosclerosis (ICD10-I70.0). Twelve-lead EKG reviewed by me shows normal sinus rhythm   There are no new results  to review at this time.  Assessment and Plan: * Aspiration pneumonia [facility]) Patient presents for evaluation of worsening shortness of breath from her baseline and imaging shows findings concerning for aspiration pneumonia. Risk factors for aspiration include history of prior stroke and possible microaspiration from large hiatal hernia Speech therapy evaluation Aspiration precautions Place patient on Unasyn  Hiatal hernia with GERD Patient has a known history of a large hiatal hernia with GERD Head of bed elevated Aspiration precautions Continue Protonix  Type II diabetes mellitus with complication (HCC) Hold metformin Blood sugar checks every 4 hours while patient is n.p.o. for swallow function evaluation  Rheumatoid arthritis involving multiple sites with positive rheumatoid factor (HCC) Stable Continue methotrexate and folic acid  Hypertension Blood pressure is stable Continue spironolactone  Protein calorie malnutrition ([place]) Unknown severity Patient has a BMI of 16.76 kg/m2 She has temporal wasting, prominent clavicles and ribs. We will request dietitian evaluation during this hospitalization We will start patient on nutritional supplements after she has been evaluated by speech therapy  Depression Stable Continue sertraline, mirtazapine and Cymbalta  Stroke ([place]) Continue aspirin and atorvastatin      Advance Care Planning:   Code Status: DNR   Consults: Speech therapy  Family Communication: Greater than 50% of time was spent discussing patient's condition and plan of care with her at the bedside.  All questions and concerns have been addressed.  CODE STATUS was discussed and she wishes to be a DO NOT RESUSCITATE.  She lists Mamie Nick as her healthcare power of  attorney  Severity of Illness: The appropriate patient status for this patient is INPATIENT. Inpatient status is judged to be reasonable and necessary in order to provide the required intensity of service to ensure the patient's safety. The patient's presenting symptoms, physical exam findings, and initial radiographic and laboratory data in the context of their chronic comorbidities is felt to place them at high risk for further clinical deterioration. Furthermore, it is not anticipated that the patient will be medically stable for discharge from the hospital within 2 midnights of admission.   * I certify that at the point of admission it is my clinical judgment that the patient will require inpatient hospital care spanning beyond 2 midnights from the point of admission due to high intensity of service, high risk for further deterioration and high frequency of surveillance required.*  Author: Collier Bullock, MD 04/11/2022 9:21 PM  For on call review [email or web address].

## 2022-04-12 ENCOUNTER — Telehealth: Payer: Self-pay | Admitting: Emergency Medicine

## 2022-04-12 DIAGNOSIS — J690 Pneumonitis due to inhalation of food and vomit: Secondary | ICD-10-CM | POA: Diagnosis not present

## 2022-04-12 LAB — CBG MONITORING, ED: Glucose-Capillary: 76 mg/dL (ref 70–99)

## 2022-04-12 MED ORDER — AMOXICILLIN-POT CLAVULANATE 875-125 MG PO TABS
1.0000 | ORAL_TABLET | Freq: Two times a day (BID) | ORAL | Status: DC
  Administered 2022-04-12: 1 via ORAL
  Filled 2022-04-12: qty 1

## 2022-04-12 MED ORDER — AMOXICILLIN-POT CLAVULANATE 500-125 MG PO TABS: 1.0000 | ORAL_TABLET | Freq: Two times a day (BID) | ORAL | 0 refills | Status: DC

## 2022-04-12 MED ORDER — PANTOPRAZOLE SODIUM 40 MG PO TBEC: 40.0000 mg | DELAYED_RELEASE_TABLET | Freq: Every day | ORAL | 1 refills | Status: DC

## 2022-04-12 MED ORDER — AMOXICILLIN-POT CLAVULANATE 875-125 MG PO TABS: 1.0000 | ORAL_TABLET | Freq: Two times a day (BID) | ORAL | 0 refills | Status: AC

## 2022-04-12 MED ORDER — TIMOLOL MALEATE 0.5 % OP SOLN: 1.0000 [drp] | Freq: Two times a day (BID) | OPHTHALMIC | Status: DC

## 2022-04-12 MED ORDER — VITAMIN D3 25 MCG PO TABS: 1000.0000 [IU] | ORAL_TABLET | Freq: Every day | ORAL | 0 refills | Status: DC

## 2022-04-12 MED ORDER — BISACODYL 10 MG RE SUPP
10.0000 mg | Freq: Every day | RECTAL | Status: DC
  Filled 2022-04-12: qty 1

## 2022-04-12 NOTE — Discharge Instructions (Signed)
F/u with your PCP in 1-2 weeks

## 2022-04-12 NOTE — Discharge Summary (Addendum)
Physician Discharge Summary   Patient: Wanda Monroe MRN: [id number] DOB: [birth year]  Admit date:     04/11/2022  Discharge date: 04/12/22  Discharge Physician: Fritzi Mandes   PCP: Pcp, No   Recommendations at discharge:    F/u Dr Dahlia Byes if you wish to for further evaluation of Hiatal hernia   Discharge Diagnoses: Principal Problem:   Aspiration pneumonia ([place]) Active Problems:   Hiatal hernia with GERD   Type II diabetes mellitus with complication ([place])   Rheumatoid arthritis involving multiple sites with positive rheumatoid factor ([place])   Hypertension   Protein calorie malnutrition ([place])   Stroke [facility])   Depression   Hospital Course: Wanda Monroe is a 81 y.o. female with medical history significant for rheumatoid arthritis, lumbar spinal stenosis, hiatal hernia with GERD, type 2 diabetes mellitus, hypertension, anemia of chronic disease, history of CVA who presents to the ER via EMS for evaluation of shortness of breath. She also complains of dysphagia mostly with solids and thinks that her symptoms have worsened over the last couple of weeks.  CT angiogram done to rule out PE which showed Clustered centrilobular nodules of the right middle lobe and right upper lobe, largest measures 4 mm, likely due to infection or aspiration.  Aspiration pneumonia (Wanda Monroe) H/o Hiatal hernia Patient presents for evaluation of worsening shortness of breath from her baseline and imaging shows findings concerning for aspiration pneumonia. Risk factors for aspiration include history of prior stroke and possible microaspiration from large hiatal hernia Speech therapy evaluation appreciated Aspiration precautions Place patient on Unasyn--change to po augmentin No resp distress. Sats 100% on ra. Ambulating well. Pt is advised to f/u Dr pabon if she wants to pursue further evaluation   Hiatal hernia with GERD Patient has a known history of a large hiatal hernia with GERD Head of bed  elevated Aspiration precautions Continue Protonix   Type II diabetes mellitus with complication (Wanda Monroe) Resume home meds Blood sugar checks every 4 hours while patient is n.p.o. for swallow function evaluation   Rheumatoid arthritis involving multiple sites with positive rheumatoid factor ([facility]) Stable Continue methotrexate and folic acid   Hypertension Blood pressure is stable Continue spironolactone   Protein calorie malnutrition (Wanda Monroe) Unknown severity Patient has a BMI of 16.76 kg/m2 She has temporal wasting, prominent clavicles and ribs. We will request dietitian evaluation during this hospitalization We will start patient on nutritional supplements after she has been evaluated by speech therapy   Depression Stable Continue sertraline, mirtazapine and Cymbalta   Stroke (Wanda Monroe) Continue aspirin and atorvastatin   Pt overall feels well. Pt wants to go home. Will d/c to home with PCP f/u        Disposition: Home Diet recommendation:  Discharge Diet Orders (From admission, onward)     Start     Ordered   04/12/22 0000  Diet - low sodium heart healthy        04/12/22 1114           Cardiac and Carb modified diet DISCHARGE MEDICATION: Allergies as of 04/12/2022       Reactions   Codeine Sulfate Nausea Only   Remicade [infliximab] Other (See Comments)   Infection        Medication List     STOP taking these medications    Vitamin D-3 25 MCG (1000 UT) Caps Replaced by: vitamin D3 25 MCG tablet       TAKE these medications    acetaminophen 650 MG CR tablet Commonly  known as: TYLENOL Take 650 mg by mouth every 8 (eight) hours as needed for pain.   amoxicillin-clavulanate 875-125 MG tablet Commonly known as: AUGMENTIN Take 1 tablet by mouth every 12 (twelve) hours for 6 days.   aspirin 81 MG tablet Take 81 mg by mouth daily. AM   atorvastatin 80 MG tablet Commonly known as: LIPITOR Take 80 mg by mouth daily. PM   calcium citrate-vitamin D  315-200 MG-UNIT tablet Commonly known as: CITRACAL+D Take 1 tablet by mouth daily.   cyanocobalamin 1000 MCG tablet Commonly known as: VITAMIN B12 Take 1,000 mcg by mouth daily.   DULoxetine 30 MG capsule Commonly known as: CYMBALTA Take 30 mg by mouth daily.   folic acid 1 MG tablet Commonly known as: FOLVITE Take 1 mg by mouth daily.   gabapentin 100 MG capsule Commonly known as: NEURONTIN Take 200 mg by mouth 2 (two) times daily.   hydrochlorothiazide 25 MG tablet Commonly known as: HYDRODIURIL Take 25 mg by mouth daily.   latanoprost 0.005 % ophthalmic solution Commonly known as: XALATAN Place 1 drop into both eyes at bedtime.   metFORMIN 500 MG tablet Commonly known as: GLUCOPHAGE Take 500 mg by mouth 2 (two) times daily with a meal.   methotrexate 2.5 MG tablet Commonly known as: RHEUMATREX Take 12.5 mg by mouth every Thursday.   mirtazapine 15 MG tablet Commonly known as: REMERON Take 15 mg by mouth at bedtime.   pantoprazole 40 MG tablet Commonly known as: PROTONIX Take 1 tablet (40 mg total) by mouth daily.   Potassium Chloride ER 20 MEQ Tbcr Take 1 tablet by mouth 2 (two) times daily.   sertraline 50 MG tablet Commonly known as: ZOLOFT Take 50 mg by mouth at bedtime.   spironolactone 25 MG tablet Commonly known as: ALDACTONE Take 25 mg by mouth daily.   timolol 0.5 % ophthalmic solution Commonly known as: TIMOPTIC Place 1 drop into both eyes 2 (two) times daily.   tiZANidine 4 MG tablet Commonly known as: ZANAFLEX Take 4 mg by mouth 3 (three) times daily as needed for muscle spasms.   traMADol 50 MG tablet Commonly known as: ULTRAM Take 50 mg by mouth daily as needed for moderate pain.   vitamin D3 25 MCG tablet Commonly known as: CHOLECALCIFEROL Take 1 tablet (1,000 Units total) by mouth daily. Start taking on: April 13, 2022 Replaces: Vitamin D-3 25 MCG (1000 UT) Caps        Follow-up Information     Pabon, [place] F, MD. Call.    Specialty: General Surgery Why: As needed for Hiatal hernia Contact information: [address] [place] [address] [phone]                Discharge Exam: Danley Danker Weights   04/11/22 1729  Weight: 48.5 kg     Condition at discharge: fair  The results of significant diagnostics from this hospitalization (including imaging, microbiology, ancillary and laboratory) are listed below for reference.   Imaging Studies: CT Angio Chest PE W and/or Wo Contrast  Result Date: 04/11/2022 CLINICAL DATA:  Shortness of breath EXAM: CT ANGIOGRAPHY CHEST WITH CONTRAST TECHNIQUE: Multidetector CT imaging of the chest was performed using the standard protocol during bolus administration of intravenous contrast. Multiplanar CT image reconstructions and MIPs were obtained to evaluate the vascular anatomy. RADIATION DOSE REDUCTION: This exam was performed according to the departmental dose-optimization program which includes automated exposure control, adjustment of the mA and/or kV according to patient size  and/or use of iterative reconstruction technique. CONTRAST:  42m OMNIPAQUE IOHEXOL 350 MG/ML SOLN COMPARISON:  None Available. FINDINGS: Cardiovascular: Normal heart size. No pericardial effusion. Normal caliber thoracic aorta with moderate atherosclerotic disease. Mediastinum/Nodes: Large hiatal hernia. No pathologically enlarged lymph nodes seen in the chest. Lungs/Pleura: Central airways are patent. Clustered centrilobular nodules of the right middle lobe and right upper lobe, largest measures 4 mm on series 6, image 47. No consolidation, pleural effusion or pneumothorax. Upper Abdomen: No acute abnormality. Musculoskeletal: No chest wall abnormality. No acute or significant osseous findings. Review of the MIP images confirms the above findings. IMPRESSION: 1. No evidence of pulmonary embolus. 2. Large hiatal hernia. 3. Clustered centrilobular nodules of the right middle lobe and  right upper lobe, largest measures 4 mm, likely due to infection or aspiration. No follow-up needed if patient is low-risk (and has no known or suspected primary neoplasm). Non-contrast chest CT can be considered in 12 months if patient is high-risk. This recommendation follows the consensus statement: Guidelines for Management of Incidental Pulmonary Nodules Detected on CT Images: From the Fleischner Society 2017; Radiology 2017; 284:228-243. 4. Aortic Atherosclerosis (ICD10-I70.0). Electronically Signed   By: LYetta GlassmanM.D.   On: 04/11/2022 18:13   CT Abdomen Pelvis W Contrast  Result Date: 04/11/2022 CLINICAL DATA:  Right lower quadrant abdominal pain. Shortness of breath. EXAM: CT ABDOMEN AND PELVIS WITH CONTRAST TECHNIQUE: Multidetector CT imaging of the abdomen and pelvis was performed using the standard protocol following bolus administration of intravenous contrast. RADIATION DOSE REDUCTION: This exam was performed according to the departmental dose-optimization program which includes automated exposure control, adjustment of the mA and/or kV according to patient size and/or use of iterative reconstruction technique. CONTRAST:  740mOMNIPAQUE IOHEXOL 350 MG/ML SOLN COMPARISON:  CT 12/23/2016 FINDINGS: Lower chest: Large hiatal hernia as seen previously. Lung bases are clear. Hepatobiliary: Liver parenchyma is normal.  No calcified gallstones. Pancreas: Normal Spleen: Spleen upper limits of normal in size, slightly enlarged since the prior study. Adrenals/Urinary Tract: Adrenal glands are normal. Kidneys are normal. No cyst, mass, stone or hydronephrosis. Bladder is normal. Stomach/Bowel: Hiatal hernia as noted above. No small bowel abnormality is seen. No visible appendix. No inflammatory change noted in the right lower quadrant. Patient does have a large amount of stool throughout the colon. No evidence of diverticulosis or diverticulitis. Vascular/Lymphatic: Aortic atherosclerosis. No aneurysm.  IVC is normal. No adenopathy. Reproductive: Previous hysterectomy.  No pelvic mass. Other: No free fluid or air. Musculoskeletal: Scoliosis and degenerative change of the spine. IMPRESSION: Previous appendectomy by history. No inflammatory change seen in the right lower quadrant. Large amount of stool throughout the colon. No evidence of diverticulosis or diverticulitis. Large hiatal hernia.  No acute feature. Aortic atherosclerosis. Electronically Signed   By: MaNelson Chimes.D.   On: 04/11/2022 18:09    Microbiology: Results for orders placed or performed during the hospital encounter of 04/11/22  SARS Coronavirus 2 by RT PCR (hospital order[facility]) *cepheid single result test* Urine, Clean Catch     Status: None   Collection Time: 04/11/22  9:49 PM   Specimen: Urine, Clean Catch; Nasal Swab  Result Value Ref Range Status   SARS Coronavirus 2 by RT PCR NEGATIVE NEGATIVE Final    Comment: (NOTE) SARS-CoV-2 target nucleic acids are NOT DETECTED.  The SARS-CoV-2 RNA is generally detectable in upper and lower respiratory specimens during the acute phase of infection. The lowest concentration of SARS-CoV-2 viral copies  this assay can detect is 250 copies / mL. A negative result does not preclude SARS-CoV-2 infection and should not be used as the sole basis for treatment or other patient management decisions.  A negative result may occur with improper specimen collection / handling, submission of specimen other than nasopharyngeal swab, presence of viral mutation(s) within the areas targeted by this assay, and inadequate number of viral copies (<250 copies / mL). A negative result must be combined with clinical observations, patient history, and epidemiological information.  Fact Sheet for Patients:   https://www.[employer] Happ.info/  Fact Sheet for Healthcare Providers: https://hall.com/  This test is not yet approved or   cleared by the [place] FDA and has been authorized for detection and/or diagnosis of SARS-CoV-2 by FDA under an Emergency Use Authorization (EUA).  This EUA will remain in effect (meaning this test can be used) for the duration of the COVID-19 declaration under Section 564(b)(1) of the Act, 21 U.S.C. section 360bbb-3(b)(1), unless the authorization is terminated or revoked sooner.  Performed at [facility], [address] [place], [place] [address]     Labs: CBC: Recent Labs  Lab 04/11/22 1728  WBC 7.1  NEUTROABS 5.2  HGB 11.1*  HCT 34.1*  MCV 92.4  PLT 662*   Basic Metabolic Panel: Recent Labs  Lab 04/11/22 1728  NA 139  K 3.5  CL 103  CO2 24  GLUCOSE 107*  BUN 18  CREATININE 0.81  CALCIUM 9.8   Liver Function Tests: Recent Labs  Lab 04/11/22 1728  AST 32  ALT 18  ALKPHOS 80  BILITOT 0.9  PROT 7.3  ALBUMIN 4.1   CBG: Recent Labs  Lab 04/12/22 0815  GLUCAP 76    Discharge time spent: greater than 30 minutes.  Signed: Fritzi Mandes, MD [facility] 04/12/2022

## 2022-04-12 NOTE — Care Management CC44 (Signed)
Condition Code 44 Documentation Completed  Patient Details  Name: MARVENA TALLY MRN: [id number] Date of Birth: October 05, 1940   Condition Code 44 given:  Yes Patient signature on Condition Code 44 notice:  Yes Documentation of 2 MD's agreement:  Yes Code 44 added to claim:  Yes    Shelbie Hutching, RN 04/12/2022, 11:47 AM

## 2022-04-12 NOTE — Care Management Obs Status (Signed)
[facility] NOTIFICATION   Patient Details  Name: EMAAN GARY MRN: [id number] Date of Birth: 1941-07-15   Medicare Observation Status Notification Given:  Yes    Shelbie Hutching, RN 04/12/2022, 11:47 AM

## 2022-04-12 NOTE — Evaluation (Signed)
Clinical/Bedside Swallow Evaluation Patient Details  Name: Wanda Monroe MRN: [id number] Date of Birth: 21-Feb-1941  Today's Date: 04/12/2022 Time: SLP Start Time (ACUTE ONLY): 0950 SLP Stop Time (ACUTE ONLY): 1050 SLP Time Calculation (min) (ACUTE ONLY): 60 min  Past Medical History:  Past Medical History:  Diagnosis Date   Anemia    Arthritis    rhuematoid,OSTEOARTHRITIS   Cellulitis    Diabetes mellitus without complication (HCC)    type II   GERD (gastroesophageal reflux disease)    Gout    Hiatal hernia    Hyperlipidemia    Hypertension    Neuromuscular disorder ([place])    left foot neuropathy   Osteoporosis    Rosacea    Spinal stenosis of lumbar region    Stroke ([place])    x2, 9/10   Temporal arteritis ([place])    Wears dentures    full top, partial bottom   Past Surgical History:  Past Surgical History:  Procedure Laterality Date   ABDOMINAL HYSTERECTOMY     APPENDECTOMY     BACK SURGERY     lumbar and cervical laminectomies   BREAST BIOPSY Left 1960,1980   twice negative results   CAPSULOTOMY METATARSOPHALANGEAL Left 01/22/2015   Procedure: CAPSULOTOMY METATARSOPHALANGEAL;  Surgeon: Albertine Patricia, DPM;  Location: [facility];  Service: Podiatry;  Laterality: Left;   CATARACT EXTRACTION W/PHACO Left 10/19/2016   Procedure: CATARACT EXTRACTION PHACO AND INTRAOCULAR LENS PLACEMENT (IOC)  Left Eye  Diabetic;  Surgeon: Leandrew Koyanagi, MD;  Location: [facility];  Service: Ophthalmology;  Laterality: Left;  Diabetic - oral meds Left Eye   CATARACT EXTRACTION W/PHACO Right 11/16/2016   Procedure: CATARACT EXTRACTION PHACO AND INTRAOCULAR LENS PLACEMENT (IOC) dIABETIC;  Surgeon: Leandrew Koyanagi, MD;  Location: [facility];  Service: Ophthalmology;  Laterality: Right;  DIABETES - oral meds   COLONOSCOPY WITH PROPOFOL N/A 04/24/2018   Procedure: COLONOSCOPY WITH PROPOFOL;  Surgeon: Toledo, Benay Pike, MD;  Location: [facility];  Service:  Gastroenterology;  Laterality: N/A;   ESOPHAGOGASTRODUODENOSCOPY N/A 07/01/2021   Procedure: ESOPHAGOGASTRODUODENOSCOPY (EGD);  Surgeon: Benjamine Sprague, DO;  Location: [facility];  Service: General;  Laterality: N/A;   ESOPHAGOGASTRODUODENOSCOPY (EGD) WITH PROPOFOL N/A 04/24/2018   Procedure: ESOPHAGOGASTRODUODENOSCOPY (EGD) WITH PROPOFOL;  Surgeon: Toledo, Benay Pike, MD;  Location: [facility];  Service: Gastroenterology;  Laterality: N/A;   FOOT SURGERY     HALLUX VALGUS AKIN Left 01/22/2015   Procedure: HALLUX VALGUS AKIN;  Surgeon: Albertine Patricia, DPM;  Location: [facility];  Service: Podiatry;  Laterality: Left;  left great toe   HEMORRHOID SURGERY     KNEE ARTHROSCOPY     LUMBAR LAMINECTOMY  04/23/13   NOSE SURGERY     TONSILLECTOMY     URETER SURGERY     HPI:  Pt is a 81 y.o. female with medical history significant for rheumatoid arthritis, lumbar spinal stenosis, Mod-Large hiatal hernia with GERD, type 2 diabetes mellitus, hypertension, anemia of chronic disease, history of CVA who presents to the ER via EMS for evaluation of shortness of breath.  Per patient she has had shortness of breath for over 1 year but it got worse on the day of admission.  Shortness of breath is not related to rest or exertion.  She denies having any cough, no fever, no chills, no nausea or vomiting.  She has a history of hiatal hernia and has chronic epigastric pain as well as reflux symptoms.  She also complains of dysphagia mostly with solids  and thinks that her symptoms have worsened over the last couple of weeks.  She also complains of pain in the right lower quadrant which she rates a 6 x 10 in intensity at its worst.  Pain is nonradiating and is associated with constipation which she states is chronic.  She denies having any chest pain, no blurred vision, no focal deficit, no headache, no leg swelling, no dizziness, no lightheadedness, no focal deficit.  She had a CT angiogram done to rule out PE  which showed Clustered centrilobular nodules of the right middle lobe and  right upper lobe, largest measures 4 mm, likely due to infection or aspiration.  MD stated concern of REFLUX aspiration -- suspect this could be attributed to the [facility] Hiatal Hernia per Upper Endoscopy/Imaging in 2022: "A medium-sized hiatal hernia was present.". CT of Abd. this visit: Large hiatal hernia.    Assessment / Plan / Recommendation  Clinical Impression   Pt seen for BSE today in the ED. Pt was verbal and engaged easily; min distracted at times. She reported her bottom Denture partial "broke" recently. She also stated some concern re: her Hiatal Hernia and options of treatment.   Pt appears to present w/ adequate oropharyngeal phase swallowing function w/ No overt oropharyngeal phase dysphagia appreciated; No neuromuscular swallowing deficits appreciated. Pt is at reduced risk for aspiration from an oropharyngeal phase standpoint following general aspiration precautions.   HOWEVER, pt has a baseline of GERD/REFLUX and a MOD-LARGE Hiatal Hernia per chart Imaging/notes. ANY Dysmotility of the Esophagus or Regurgitation of Reflux material can increase risk for aspiration of the Reflux material during Retrograde backflow thus impact Voicing and Pulmonary status. Pt described ongoing issues w/ "fullness in my chest" pointing to mid-sternum area then described difficulty w/ solid foods in her diet; "I do not eat chicken/meat anymore" d/t the discomfort.   Pt sat upright in bed this evaluation and fed self trials of thin liquids Via Cup (both room temp and cool drinks), then purees and soft solids moistened well w/ no immediate, overt clinical s/s of aspiration noted; clear vocal quality b/t trials, no decline in pulmonary status, no multiple swallows noted post initial pharyngeal swallow. Oral phase appeared [facility] for bolus management and timely A-P transfer/clearing of material. Pt swallowed Pills WHOLE in Puree w/ NSG w/  ease also(educated on for ease of clearing if pt wanted to use at home).  OM exam was [facility] for oral clearing; lingual/labial movements. No unilateral weakness. Speech clear. Of Note, pt stated she was not wearing the broken Denture partial(lower) -- discussed how this impacts effective mastication of solids and the need to moisten and cut solids small for ease of oral phase mashing.   Recommend a more Mech Soft diet for ease of cooked/moistened foods w/ meats Cut(w/ less meats/breads in diet) w/ thin liquids via Cup/less straw use d/t air swallowing - pt agreed; general aspiration precautions. Rest Breaks during meals/oral intake to allow for Esophageal clearing. REFLUX precautions strongly recommended to lessen chance for Regurgitation. Pills WHOLE in Puree.  Recommend pt continue f/u w/ GI for management of Reflux and tx as indicated. Discussion of options to address Hiatal Hernias.  Recommend Dietician f/u for nutritional support.  No further skilled ST needs indicated. MD to reconsult ST services if any new needs while admitted. SLP Visit Diagnosis: Dysphagia, unspecified (R13.10)    Aspiration Risk  No limitations    Diet Recommendation   a more Mech Soft diet for ease of cooked/moistened foods  w/ meats Cut(w/ less meats/breads in diet) w/ thin liquids via Cup/less straw use d/t air swallowing - pt agreed; general aspiration precautions. Rest Breaks during meals/oral intake to allow for Esophageal clearing. REFLUX precautions strongly recommended to lessen chance for Regurgitation.  Medication Administration: Whole meds with puree (for ease of swallowing/Esophageal clearing -- practiced w/ NSG)    Other  Recommendations Recommended Consults: Consider GI evaluation;Consider esophageal assessment (Dietician f/u; Palliative Care f/u for [place]) Oral Care Recommendations: Oral care BID;Oral care before and after PO;Patient independent with oral care (denture care) Other Recommendations:  (n/a)     Recommendations for follow up therapy are one component of a multi-disciplinary discharge planning process, led by the attending physician.  Recommendations may be updated based on patient status, additional functional criteria and insurance authorization.  Follow up Recommendations No SLP follow up      Assistance Recommended at Discharge None  Functional Status Assessment Patient has had a recent decline in their functional status and/or demonstrates limited ability to make significant improvements in function in a reasonable and predictable amount of time  Frequency and Duration  (n/a)   (n/a)       Prognosis Prognosis for Safe Diet Advancement: Fair Barriers to Reach Goals: Time post onset;Severity of deficits Barriers/Prognosis Comment: MOD-LARGE Hiatal Hernia; GERD baseline      Swallow Study   General Date of Onset: 04/11/22 HPI: Pt is a 81 y.o. female with medical history significant for rheumatoid arthritis, lumbar spinal stenosis, Mod-Large hiatal hernia with GERD, type 2 diabetes mellitus, hypertension, anemia of chronic disease, history of CVA who presents to the ER via EMS for evaluation of shortness of breath.  Per patient she has had shortness of breath for over 1 year but it got worse on the day of admission.  Shortness of breath is not related to rest or exertion.  She denies having any cough, no fever, no chills, no nausea or vomiting.  She has a history of hiatal hernia and has chronic epigastric pain as well as reflux symptoms.  She also complains of dysphagia mostly with solids and thinks that her symptoms have worsened over the last couple of weeks.  She also complains of pain in the right lower quadrant which she rates a 6 x 10 in intensity at its worst.  Pain is nonradiating and is associated with constipation which she states is chronic.  She denies having any chest pain, no blurred vision, no focal deficit, no headache, no leg swelling, no dizziness, no  lightheadedness, no focal deficit.  She had a CT angiogram done to rule out PE which showed Clustered centrilobular nodules of the right middle lobe and  right upper lobe, largest measures 4 mm, likely due to infection or aspiration.  MD stated concern of REFLUX aspiration -- suspect this could be attributed to the [facility] Hiatal Hernia per Upper Endoscopy/Imaging in 2022: "A medium-sized hiatal hernia was present.". CT of Abd. this visit: Large hiatal hernia. Type of Study: Bedside Swallow Evaluation Previous Swallow Assessment: none Diet Prior to this Study: NPO (regular diet at home) Temperature Spikes Noted: No (wbc 7.1) Respiratory Status: Nasal cannula (3L) History of Recent Intubation: No Behavior/Cognition: Alert;Cooperative;Pleasant mood;Distractible (mild) Oral Cavity Assessment: Within Functional Limits Oral Care Completed by SLP: Yes Oral Cavity - Dentition: Dentures, top (few front lower teeth -- bottom partial broke recently) Vision: Functional for self-feeding Self-Feeding Abilities: Able to feed self;Needs set up Patient Positioning: Upright in bed (supported positioning) Baseline Vocal Quality: Normal Volitional  Cough: Strong Volitional Swallow: Able to elicit    Oral/Motor/Sensory Function Overall Oral Motor/Sensory Function: Within functional limits   Ice Chips Ice chips: Within functional limits Presentation: Spoon (fed; 2 trials)   Thin Liquid Thin Liquid: Within functional limits Presentation: Cup;Self Fed (~6 ozs) Other Comments: does not use straws    Nectar Thick Nectar Thick Liquid: Not tested   Honey Thick Honey Thick Liquid: Not tested   Puree Puree: Within functional limits Presentation: Self Fed;Spoon (~4 ozs)   Solid     Solid: Within functional limits (moistened) Presentation: Self Fed (10 trials)         Orinda Kenner, MS, CCC-SLP Speech Language Pathologist Rehab Services; [place] [phone]  (ascom) Wanda Monroe 04/12/2022,4:05 PM

## 2022-04-14 DIAGNOSIS — K5901 Slow transit constipation: Secondary | ICD-10-CM | POA: Diagnosis not present

## 2022-04-14 DIAGNOSIS — J690 Pneumonitis due to inhalation of food and vomit: Secondary | ICD-10-CM | POA: Diagnosis not present

## 2022-04-14 DIAGNOSIS — Z9181 History of falling: Secondary | ICD-10-CM | POA: Diagnosis not present

## 2022-04-18 DIAGNOSIS — Z8673 Personal history of transient ischemic attack (TIA), and cerebral infarction without residual deficits: Secondary | ICD-10-CM | POA: Diagnosis not present

## 2022-04-18 DIAGNOSIS — J690 Pneumonitis due to inhalation of food and vomit: Secondary | ICD-10-CM | POA: Diagnosis not present

## 2022-04-18 DIAGNOSIS — M1990 Unspecified osteoarthritis, unspecified site: Secondary | ICD-10-CM | POA: Diagnosis not present

## 2022-04-18 DIAGNOSIS — M0579 Rheumatoid arthritis with rheumatoid factor of multiple sites without organ or systems involvement: Secondary | ICD-10-CM | POA: Diagnosis not present

## 2022-04-18 DIAGNOSIS — J9601 Acute respiratory failure with hypoxia: Secondary | ICD-10-CM | POA: Diagnosis not present

## 2022-04-18 DIAGNOSIS — J310 Chronic rhinitis: Secondary | ICD-10-CM | POA: Diagnosis not present

## 2022-04-18 DIAGNOSIS — R54 Age-related physical debility: Secondary | ICD-10-CM | POA: Diagnosis not present

## 2022-04-18 DIAGNOSIS — R630 Anorexia: Secondary | ICD-10-CM | POA: Diagnosis not present

## 2022-04-18 DIAGNOSIS — D849 Immunodeficiency, unspecified: Secondary | ICD-10-CM | POA: Diagnosis not present

## 2022-05-12 DIAGNOSIS — E1149 Type 2 diabetes mellitus with other diabetic neurological complication: Secondary | ICD-10-CM | POA: Diagnosis not present

## 2022-05-12 DIAGNOSIS — I10 Essential (primary) hypertension: Secondary | ICD-10-CM | POA: Diagnosis not present

## 2022-05-12 DIAGNOSIS — R54 Age-related physical debility: Secondary | ICD-10-CM | POA: Diagnosis not present

## 2022-05-12 DIAGNOSIS — E785 Hyperlipidemia, unspecified: Secondary | ICD-10-CM | POA: Diagnosis not present

## 2022-05-12 DIAGNOSIS — M1990 Unspecified osteoarthritis, unspecified site: Secondary | ICD-10-CM | POA: Diagnosis not present

## 2022-05-12 DIAGNOSIS — D649 Anemia, unspecified: Secondary | ICD-10-CM | POA: Diagnosis not present

## 2022-05-12 DIAGNOSIS — F339 Major depressive disorder, recurrent, unspecified: Secondary | ICD-10-CM | POA: Diagnosis not present

## 2022-05-19 DIAGNOSIS — J310 Chronic rhinitis: Secondary | ICD-10-CM | POA: Diagnosis not present

## 2022-05-19 DIAGNOSIS — J690 Pneumonitis due to inhalation of food and vomit: Secondary | ICD-10-CM | POA: Diagnosis not present

## 2022-05-19 DIAGNOSIS — M1990 Unspecified osteoarthritis, unspecified site: Secondary | ICD-10-CM | POA: Diagnosis not present

## 2022-05-19 DIAGNOSIS — M0579 Rheumatoid arthritis with rheumatoid factor of multiple sites without organ or systems involvement: Secondary | ICD-10-CM | POA: Diagnosis not present

## 2022-06-18 DIAGNOSIS — M0579 Rheumatoid arthritis with rheumatoid factor of multiple sites without organ or systems involvement: Secondary | ICD-10-CM | POA: Diagnosis not present

## 2022-06-18 DIAGNOSIS — M1990 Unspecified osteoarthritis, unspecified site: Secondary | ICD-10-CM | POA: Diagnosis not present

## 2022-06-18 DIAGNOSIS — J310 Chronic rhinitis: Secondary | ICD-10-CM | POA: Diagnosis not present

## 2022-06-18 DIAGNOSIS — J690 Pneumonitis due to inhalation of food and vomit: Secondary | ICD-10-CM | POA: Diagnosis not present

## 2022-07-19 DIAGNOSIS — J310 Chronic rhinitis: Secondary | ICD-10-CM | POA: Diagnosis not present

## 2022-07-19 DIAGNOSIS — M0579 Rheumatoid arthritis with rheumatoid factor of multiple sites without organ or systems involvement: Secondary | ICD-10-CM | POA: Diagnosis not present

## 2022-07-19 DIAGNOSIS — J690 Pneumonitis due to inhalation of food and vomit: Secondary | ICD-10-CM | POA: Diagnosis not present

## 2022-07-19 DIAGNOSIS — M1990 Unspecified osteoarthritis, unspecified site: Secondary | ICD-10-CM | POA: Diagnosis not present

## 2022-08-18 DIAGNOSIS — M0579 Rheumatoid arthritis with rheumatoid factor of multiple sites without organ or systems involvement: Secondary | ICD-10-CM | POA: Diagnosis not present

## 2022-08-18 DIAGNOSIS — J690 Pneumonitis due to inhalation of food and vomit: Secondary | ICD-10-CM | POA: Diagnosis not present

## 2022-08-18 DIAGNOSIS — J310 Chronic rhinitis: Secondary | ICD-10-CM | POA: Diagnosis not present

## 2022-08-18 DIAGNOSIS — M1990 Unspecified osteoarthritis, unspecified site: Secondary | ICD-10-CM | POA: Diagnosis not present

## 2023-05-20 ENCOUNTER — Encounter: Payer: Self-pay | Admitting: Internal Medicine

## 2023-05-20 ENCOUNTER — Inpatient Hospital Stay
Admission: EM | Admit: 2023-05-20 | Discharge: 2023-05-25 | DRG: 522 | Disposition: A | Discharge status: Skilled Nursing Facility | Attending: Internal Medicine | Admitting: Internal Medicine

## 2023-05-20 ENCOUNTER — Other Ambulatory Visit: Payer: Self-pay

## 2023-05-20 ENCOUNTER — Emergency Department

## 2023-05-20 DIAGNOSIS — F32A Depression, unspecified: Secondary | ICD-10-CM | POA: Diagnosis present

## 2023-05-20 DIAGNOSIS — E785 Hyperlipidemia, unspecified: Secondary | ICD-10-CM | POA: Diagnosis present

## 2023-05-20 DIAGNOSIS — M810 Age-related osteoporosis without current pathological fracture: Secondary | ICD-10-CM | POA: Diagnosis present

## 2023-05-20 DIAGNOSIS — Z7982 Long term (current) use of aspirin: Secondary | ICD-10-CM

## 2023-05-20 DIAGNOSIS — Z833 Family history of diabetes mellitus: Secondary | ICD-10-CM

## 2023-05-20 DIAGNOSIS — I10 Essential (primary) hypertension: Secondary | ICD-10-CM | POA: Diagnosis present

## 2023-05-20 DIAGNOSIS — Z79899 Other long term (current) drug therapy: Secondary | ICD-10-CM | POA: Diagnosis not present

## 2023-05-20 DIAGNOSIS — M25551 Pain in right hip: Secondary | ICD-10-CM | POA: Diagnosis present

## 2023-05-20 DIAGNOSIS — Z751 Person awaiting admission to adequate facility elsewhere: Secondary | ICD-10-CM

## 2023-05-20 DIAGNOSIS — I160 Hypertensive urgency: Secondary | ICD-10-CM | POA: Diagnosis present

## 2023-05-20 DIAGNOSIS — G8929 Other chronic pain: Secondary | ICD-10-CM | POA: Diagnosis present

## 2023-05-20 DIAGNOSIS — Z515 Encounter for palliative care: Secondary | ICD-10-CM | POA: Diagnosis not present

## 2023-05-20 DIAGNOSIS — S72001A Fracture of unspecified part of neck of right femur, initial encounter for closed fracture: Secondary | ICD-10-CM | POA: Diagnosis present

## 2023-05-20 DIAGNOSIS — K219 Gastro-esophageal reflux disease without esophagitis: Secondary | ICD-10-CM | POA: Diagnosis present

## 2023-05-20 DIAGNOSIS — E1142 Type 2 diabetes mellitus with diabetic polyneuropathy: Secondary | ICD-10-CM | POA: Diagnosis present

## 2023-05-20 DIAGNOSIS — M069 Rheumatoid arthritis, unspecified: Secondary | ICD-10-CM | POA: Diagnosis present

## 2023-05-20 DIAGNOSIS — W19XXXA Unspecified fall, initial encounter: Principal | ICD-10-CM

## 2023-05-20 DIAGNOSIS — D509 Iron deficiency anemia, unspecified: Secondary | ICD-10-CM | POA: Diagnosis present

## 2023-05-20 DIAGNOSIS — Z823 Family history of stroke: Secondary | ICD-10-CM

## 2023-05-20 DIAGNOSIS — Z7984 Long term (current) use of oral hypoglycemic drugs: Secondary | ICD-10-CM | POA: Diagnosis not present

## 2023-05-20 DIAGNOSIS — W010XXA Fall on same level from slipping, tripping and stumbling without subsequent striking against object, initial encounter: Secondary | ICD-10-CM | POA: Diagnosis present

## 2023-05-20 DIAGNOSIS — M109 Gout, unspecified: Secondary | ICD-10-CM | POA: Diagnosis present

## 2023-05-20 DIAGNOSIS — Z888 Allergy status to other drugs, medicaments and biological substances status: Secondary | ICD-10-CM | POA: Diagnosis not present

## 2023-05-20 DIAGNOSIS — Y92012 Bathroom of single-family (private) house as the place of occurrence of the external cause: Secondary | ICD-10-CM

## 2023-05-20 DIAGNOSIS — D75839 Thrombocytosis, unspecified: Secondary | ICD-10-CM | POA: Diagnosis present

## 2023-05-20 DIAGNOSIS — Z885 Allergy status to narcotic agent status: Secondary | ICD-10-CM

## 2023-05-20 DIAGNOSIS — Z90710 Acquired absence of both cervix and uterus: Secondary | ICD-10-CM

## 2023-05-20 DIAGNOSIS — D62 Acute posthemorrhagic anemia: Secondary | ICD-10-CM | POA: Diagnosis not present

## 2023-05-20 DIAGNOSIS — Z66 Do not resuscitate: Secondary | ICD-10-CM | POA: Diagnosis present

## 2023-05-20 DIAGNOSIS — Z8673 Personal history of transient ischemic attack (TIA), and cerebral infarction without residual deficits: Secondary | ICD-10-CM

## 2023-05-20 LAB — CBC WITH DIFFERENTIAL/PLATELET
Abs Immature Granulocytes: 0.04 10*3/uL (ref 0.00–0.07)
Basophils Absolute: 0 10*3/uL (ref 0.0–0.1)
Basophils Relative: 0 %
Eosinophils Absolute: 0.1 10*3/uL (ref 0.0–0.5)
Eosinophils Relative: 1 %
HCT: 35.1 % — ABNORMAL LOW (ref 36.0–46.0)
Hemoglobin: 10.3 g/dL — ABNORMAL LOW (ref 12.0–15.0)
Immature Granulocytes: 0 %
Lymphocytes Relative: 12 %
Lymphs Abs: 1.2 10*3/uL (ref 0.7–4.0)
MCH: 25.1 pg — ABNORMAL LOW (ref 26.0–34.0)
MCHC: 29.3 g/dL — ABNORMAL LOW (ref 30.0–36.0)
MCV: 85.6 fL (ref 80.0–100.0)
Monocytes Absolute: 0.7 10*3/uL (ref 0.1–1.0)
Monocytes Relative: 7 %
Neutro Abs: 8.2 10*3/uL — ABNORMAL HIGH (ref 1.7–7.7)
Neutrophils Relative %: 80 %
Platelets: 413 10*3/uL — ABNORMAL HIGH (ref 150–400)
RBC: 4.1 MIL/uL (ref 3.87–5.11)
RDW: 15.9 % — ABNORMAL HIGH (ref 11.5–15.5)
WBC: 10.2 10*3/uL (ref 4.0–10.5)
nRBC: 0 % (ref 0.0–0.2)

## 2023-05-20 LAB — BASIC METABOLIC PANEL
Anion gap: 10 (ref 5–15)
BUN: 19 mg/dL (ref 8–23)
CO2: 25 mmol/L (ref 22–32)
Calcium: 9.1 mg/dL (ref 8.9–10.3)
Chloride: 103 mmol/L (ref 98–111)
Creatinine, Ser: 0.92 mg/dL (ref 0.44–1.00)
GFR, Estimated: >60 mL/min (ref >60)
Glucose, Bld: 95 mg/dL (ref 70–99)
Potassium: 3.9 mmol/L (ref 3.5–5.1)
Sodium: 138 mmol/L (ref 135–145)

## 2023-05-20 LAB — URINALYSIS, ROUTINE W REFLEX MICROSCOPIC
Bilirubin Urine: NEGATIVE
Glucose, UA: NEGATIVE mg/dL
Hgb urine dipstick: NEGATIVE
Ketones, ur: NEGATIVE mg/dL
Leukocytes,Ua: NEGATIVE
Nitrite: NEGATIVE
Protein, ur: NEGATIVE mg/dL
Specific Gravity, Urine: 1.023 (ref 1.005–1.030)
pH: 5 (ref 5.0–8.0)

## 2023-05-20 LAB — SAMPLE TO BLOOD BANK

## 2023-05-20 MED ORDER — TRAMADOL HCL 50 MG PO TABS: 50.0000 mg | ORAL_TABLET | Freq: Every day | ORAL | Status: DC | PRN

## 2023-05-20 MED ORDER — MIRTAZAPINE 15 MG PO TABS
15.0000 mg | ORAL_TABLET | Freq: Every day | ORAL | Status: DC
  Administered 2023-05-21 – 2023-05-24 (×4): 15 mg via ORAL
  Filled 2023-05-20 (×4): qty 1

## 2023-05-20 MED ORDER — CALCIUM CITRATE 950 (200 CA) MG PO TABS
1.0000 | ORAL_TABLET | Freq: Every day | ORAL | Status: DC
  Administered 2023-05-22 – 2023-05-25 (×4): 200 mg via ORAL
  Filled 2023-05-20 (×5): qty 1

## 2023-05-20 MED ORDER — ACETAMINOPHEN 500 MG PO TABS
1000.0000 mg | ORAL_TABLET | Freq: Once | ORAL | Status: AC
  Administered 2023-05-20: 1000 mg via ORAL
  Filled 2023-05-20: qty 2

## 2023-05-20 MED ORDER — VITAMIN B-12 1000 MCG PO TABS
1000.0000 ug | ORAL_TABLET | Freq: Every day | ORAL | Status: DC
  Administered 2023-05-22 – 2023-05-25 (×4): 1000 ug via ORAL
  Filled 2023-05-20: qty 2
  Filled 2023-05-20 (×4): qty 1

## 2023-05-20 MED ORDER — MORPHINE SULFATE (PF) 4 MG/ML IV SOLN
4.0000 mg | Freq: Once | INTRAVENOUS | Status: AC
  Administered 2023-05-20: 4 mg via INTRAVENOUS
  Filled 2023-05-20: qty 1

## 2023-05-20 MED ORDER — GABAPENTIN 100 MG PO CAPS
200.0000 mg | ORAL_CAPSULE | Freq: Two times a day (BID) | ORAL | Status: DC
  Administered 2023-05-21 – 2023-05-25 (×8): 200 mg via ORAL
  Filled 2023-05-20 (×8): qty 2

## 2023-05-20 MED ORDER — SODIUM CHLORIDE 0.9 % IV SOLN: Freq: Once | INTRAVENOUS | Status: AC

## 2023-05-20 MED ORDER — METHOTREXATE (ANTI-RHEUMATIC) 2.5 MG PO TABS: 12.5000 mg | ORAL_TABLET | ORAL | Status: DC

## 2023-05-20 MED ORDER — SERTRALINE HCL 50 MG PO TABS
50.0000 mg | ORAL_TABLET | Freq: Every day | ORAL | Status: DC
  Administered 2023-05-21 – 2023-05-24 (×4): 50 mg via ORAL
  Filled 2023-05-20 (×4): qty 1

## 2023-05-20 MED ORDER — ONDANSETRON HCL 4 MG/2ML IJ SOLN
4.0000 mg | Freq: Once | INTRAMUSCULAR | Status: AC
  Administered 2023-05-20: 4 mg via INTRAVENOUS
  Filled 2023-05-20: qty 2

## 2023-05-20 MED ORDER — FENTANYL CITRATE PF 50 MCG/ML IJ SOSY
50.0000 ug | PREFILLED_SYRINGE | Freq: Once | INTRAMUSCULAR | Status: AC
  Administered 2023-05-20: 50 ug via INTRAVENOUS
  Filled 2023-05-20: qty 1

## 2023-05-20 MED ORDER — SPIRONOLACTONE 25 MG PO TABS
25.0000 mg | ORAL_TABLET | Freq: Every day | ORAL | Status: DC
  Administered 2023-05-22 – 2023-05-25 (×4): 25 mg via ORAL
  Filled 2023-05-20 (×4): qty 1

## 2023-05-20 MED ORDER — ONDANSETRON HCL 4 MG/2ML IJ SOLN: 4.0000 mg | INTRAMUSCULAR | Status: DC | PRN

## 2023-05-20 MED ORDER — VITAMIN D 25 MCG (1000 UNIT) PO TABS
1000.0000 [IU] | ORAL_TABLET | Freq: Every day | ORAL | Status: DC
  Administered 2023-05-22 – 2023-05-25 (×4): 1000 [IU] via ORAL
  Filled 2023-05-20 (×4): qty 1

## 2023-05-20 MED ORDER — FOLIC ACID 1 MG PO TABS
1.0000 mg | ORAL_TABLET | Freq: Every day | ORAL | Status: DC
  Administered 2023-05-22 – 2023-05-25 (×4): 1 mg via ORAL
  Filled 2023-05-20 (×4): qty 1

## 2023-05-20 MED ORDER — SODIUM CHLORIDE 0.9 % IV SOLN: INTRAVENOUS | Status: DC

## 2023-05-20 MED ORDER — ACETAMINOPHEN 325 MG PO TABS: 650.0000 mg | ORAL_TABLET | ORAL | Status: DC | PRN

## 2023-05-20 MED ORDER — MORPHINE SULFATE (PF) 2 MG/ML IV SOLN: 0.5000 mg | INTRAVENOUS | Status: DC | PRN

## 2023-05-20 MED ORDER — ATORVASTATIN CALCIUM 20 MG PO TABS
80.0000 mg | ORAL_TABLET | Freq: Every day | ORAL | Status: DC
  Administered 2023-05-21 – 2023-05-24 (×4): 80 mg via ORAL
  Filled 2023-05-20 (×4): qty 4

## 2023-05-20 MED ORDER — LATANOPROST 0.005 % OP SOLN
1.0000 [drp] | Freq: Every day | OPHTHALMIC | Status: DC
  Filled 2023-05-20: qty 2.5

## 2023-05-20 MED ORDER — POTASSIUM CHLORIDE CRYS ER 20 MEQ PO TBCR
20.0000 meq | EXTENDED_RELEASE_TABLET | Freq: Two times a day (BID) | ORAL | Status: DC
  Administered 2023-05-21 – 2023-05-25 (×8): 20 meq via ORAL
  Filled 2023-05-20 (×8): qty 1

## 2023-05-20 MED ORDER — TIZANIDINE HCL 4 MG PO TABS
4.0000 mg | ORAL_TABLET | Freq: Three times a day (TID) | ORAL | Status: DC | PRN
  Administered 2023-05-21 – 2023-05-22 (×2): 4 mg via ORAL
  Filled 2023-05-20 (×2): qty 2
  Filled 2023-05-20: qty 1

## 2023-05-20 MED ORDER — HYDROCODONE-ACETAMINOPHEN 5-325 MG PO TABS
1.0000 | ORAL_TABLET | Freq: Four times a day (QID) | ORAL | Status: DC | PRN
  Administered 2023-05-21: 2 via ORAL
  Administered 2023-05-21 – 2023-05-22 (×2): 1 via ORAL
  Administered 2023-05-24 (×2): 2 via ORAL
  Filled 2023-05-20: qty 2
  Filled 2023-05-20: qty 1
  Filled 2023-05-20: qty 2
  Filled 2023-05-20: qty 1
  Filled 2023-05-20 (×2): qty 2

## 2023-05-20 MED ORDER — TRAZODONE HCL 50 MG PO TABS
25.0000 mg | ORAL_TABLET | Freq: Every evening | ORAL | Status: DC | PRN
  Administered 2023-05-21 – 2023-05-24 (×2): 25 mg via ORAL
  Filled 2023-05-20 (×2): qty 1

## 2023-05-20 MED ORDER — CEFAZOLIN SODIUM-DEXTROSE 2-4 GM/100ML-% IV SOLN
2.0000 g | INTRAVENOUS | Status: AC
  Administered 2023-05-21: 2 g via INTRAVENOUS

## 2023-05-20 MED ORDER — TIMOLOL MALEATE 0.5 % OP SOLN
1.0000 [drp] | Freq: Two times a day (BID) | OPHTHALMIC | Status: DC
  Administered 2023-05-23 – 2023-05-25 (×2): 1 [drp] via OPHTHALMIC
  Filled 2023-05-20: qty 5

## 2023-05-20 MED ORDER — DULOXETINE HCL 30 MG PO CPEP
30.0000 mg | ORAL_CAPSULE | Freq: Every day | ORAL | Status: DC
  Administered 2023-05-22 – 2023-05-25 (×4): 30 mg via ORAL
  Filled 2023-05-20 (×4): qty 1

## 2023-05-20 MED ORDER — HYDROCHLOROTHIAZIDE 25 MG PO TABS
25.0000 mg | ORAL_TABLET | Freq: Every day | ORAL | Status: DC
  Administered 2023-05-22 – 2023-05-25 (×4): 25 mg via ORAL
  Filled 2023-05-20 (×4): qty 1

## 2023-05-20 MED ORDER — PANTOPRAZOLE SODIUM 40 MG PO TBEC
40.0000 mg | DELAYED_RELEASE_TABLET | Freq: Every day | ORAL | Status: DC
  Administered 2023-05-22 – 2023-05-25 (×4): 40 mg via ORAL
  Filled 2023-05-20 (×4): qty 1

## 2023-05-20 NOTE — ED Provider Notes (Signed)
[facility] Provider Note    Event Date/Time   First MD Initiated Contact with Patient 05/20/23 2133     (approximate)   History   Hip Pain and Fall   HPI  Wanda Monroe is a 82 y.o. female with past medical history of RA, CVA, on hospice though she does not recall why, here with fall.  Patient mechanical fall at home.  She fell directly onto her right hip.  She has been unable to ambulate and has had severe pain in the hip since then.  She crawled to her back deck and was calling for help for about 30 minutes before family arrived to check on her.  She denies recent fevers or chills or recent illness.  She has some chronic pain and takes low-dose oxycodone for this.     Physical Exam   Triage Vital Signs: ED Triage Vitals  Encounter Vitals Group     BP 05/20/23 2131 (!) 186/81     Systolic BP Percentile --      Diastolic BP Percentile --      Pulse Rate 05/20/23 2131 84     Resp 05/20/23 2131 (!) 21     Temp 05/20/23 2131 98.3 F (36.8 C)     Temp Source 05/20/23 2131 Oral     SpO2 05/20/23 2131 100 %     Weight 05/20/23 2132 133 lb 14.4 oz (60.7 kg)     Height 05/20/23 2132 5\' 7"  (1.702 m)     Head Circumference --      Peak Flow --      Pain Score 05/20/23 2133 10     Pain Loc --      Pain Education --      Exclude from Growth Chart --     Most recent vital signs: Vitals:   05/20/23 2230 05/21/23 0025  BP: 104/80 (!) 195/78  Pulse: 82 (!) 101  Resp: 14 18  Temp:  98 F (36.7 C)  SpO2: 100% 94%     General: Awake, no distress.  CV:  Good peripheral perfusion.  Resp:  Normal work of breathing.  Abd:  No distention.  Other:  Significant tenderness to palpation over the right hip with shortening and external rotation of the extremity.  Distal pulses are 2+ and symmetric.  Normal cap refill.   ED Results / Procedures / Treatments   Labs (all labs ordered are listed, but only abnormal results are displayed) Labs Reviewed  CBC  WITH DIFFERENTIAL/PLATELET - Abnormal; Notable for the following components:      Result Value   Hemoglobin 10.3 (*)    HCT 35.1 (*)    MCH 25.1 (*)    MCHC 29.3 (*)    RDW 15.9 (*)    Platelets 413 (*)    Neutro Abs 8.2 (*)    All other components within normal limits  URINALYSIS, ROUTINE W REFLEX MICROSCOPIC - Abnormal; Notable for the following components:   Color, Urine YELLOW (*)    APPearance CLEAR (*)    All other components within normal limits  BASIC METABOLIC PANEL  CBC  BASIC METABOLIC PANEL  SAMPLE TO BLOOD BANK     EKG Normal sinus rhythm, ventricular rate 85.  PR 127, QRS 74, QTc 452.  No acute ST elevations or depressions.  No acute events of acute ischemia or infarct.   RADIOLOGY DG hip right: Suspicious for femoral neck fracture Chest x-ray: No active disease DG knee right:  Negative   I also independently reviewed and agree with radiologist interpretations.   PROCEDURES:  Critical Care performed: No    MEDICATIONS ORDERED IN ED: Medications  ceFAZolin (ANCEF) IVPB 2g/100 mL premix (has no administration in time range)  methotrexate (RHEUMATREX) tablet 12.5 mg (has no administration in time range)  atorvastatin (LIPITOR) tablet 80 mg (has no administration in time range)  hydrochlorothiazide (HYDRODIURIL) tablet 25 mg (has no administration in time range)  spironolactone (ALDACTONE) tablet 25 mg (has no administration in time range)  DULoxetine (CYMBALTA) DR capsule 30 mg (has no administration in time range)  mirtazapine (REMERON) tablet 15 mg (has no administration in time range)  sertraline (ZOLOFT) tablet 50 mg (has no administration in time range)  pantoprazole (PROTONIX) EC tablet 40 mg (has no administration in time range)  folic acid (FOLVITE) tablet 1 mg (has no administration in time range)  cyanocobalamin (VITAMIN B12) tablet 1,000 mcg (has no administration in time range)  gabapentin (NEURONTIN) capsule 200 mg (has no administration in  time range)  tiZANidine (ZANAFLEX) tablet 4 mg (has no administration in time range)  calcium citrate-vitamin D (CITRACAL+D) 315-200 MG-UNIT per tablet 1 tablet (has no administration in time range)  cholecalciferol (VITAMIN D3) 25 MCG (1000 UNIT) tablet 1,000 Units (has no administration in time range)  Potassium Chloride ER TBCR 20 mEq (has no administration in time range)  latanoprost (XALATAN) 0.005 % ophthalmic solution 1 drop (has no administration in time range)  timolol (TIMOPTIC) 0.5 % ophthalmic solution 1 drop (has no administration in time range)  HYDROcodone-acetaminophen (NORCO/VICODIN) 5-325 MG per tablet 1-2 tablet (2 tablets Oral Given 05/21/23 0100)  morphine (PF) 2 MG/ML injection 0.5 mg (has no administration in time range)  0.9 %  sodium chloride infusion ( Intravenous Transfusing/Transfer 05/21/23 0103)  acetaminophen (TYLENOL) tablet 650 mg (has no administration in time range)  traZODone (DESYREL) tablet 25 mg (has no administration in time range)  ondansetron (ZOFRAN) injection 4 mg (has no administration in time range)  labetalol (NORMODYNE) injection 20 mg (has no administration in time range)  morphine (PF) 4 MG/ML injection 4 mg (4 mg Intravenous Given 05/20/23 2202)  ondansetron (ZOFRAN) injection 4 mg (4 mg Intravenous Given 05/20/23 2202)  morphine (PF) 4 MG/ML injection 4 mg (4 mg Intravenous Given 05/20/23 2251)  0.9 %  sodium chloride infusion ( Intravenous New Bag/Given 05/20/23 2307)  fentaNYL (SUBLIMAZE) injection 50 mcg (50 mcg Intravenous Given 05/20/23 2312)  acetaminophen (TYLENOL) tablet 1,000 mg (1,000 mg Oral Given 05/20/23 2312)     IMPRESSION / MDM / ASSESSMENT AND PLAN / ED COURSE  I reviewed the triage vital signs and the nursing notes.                              Differential diagnosis includes, but is not limited to, fall, hip fracture, contusion, dislocation, quality generalized medical condition such as occult UTI, pneumonia, anemia,  dehydration  Patient's presentation is most consistent with acute presentation with potential threat to life or bodily function.  The patient is on the cardiac monitor to evaluate for evidence of arrhythmia and/or significant heart rate changes   82 year old female here with fall and right hip pain.  Exam concerning for hip fracture. Imaging reviewed, shows R femoral neck fracture. CT Head/Cspine unremarkable. No recent illnesses or apparent precipitating factors for the fall. Dr. Joice Lofts consulted, will admit to medicine. Npo at [facility].   FINAL CLINICAL IMPRESSION(S) /  ED DIAGNOSES   Final diagnoses:  Fall, initial encounter  Closed fracture of right hip, initial encounter [facility])     Rx / DC Orders   ED Discharge Orders     None        Note:  This document was prepared using Dragon voice recognition software and may include unintentional dictation errors.   Shaune Pollack, MD 05/21/23 [phone]

## 2023-05-20 NOTE — H&P (Incomplete)
[place]   PATIENT NAME: Wanda Monroe    MR#:  [id number]  DATE OF BIRTH:  04-18-1941  DATE OF ADMISSION:  05/20/2023  PRIMARY CARE PHYSICIAN: Pcp, No   Patient is coming from: Home  REQUESTING/REFERRING PHYSICIAN: Ward, Layla Maw, DO  CHIEF COMPLAINT:   Chief Complaint  Patient presents with   Hip Pain   Fall    HISTORY OF PRESENT ILLNESS:  AVRIE SUCHARSKI is a 82 y.o. Caucasian female with medical history significant for type diabetes mellitus, GERD, hypertension, gout, dyslipidemia, osteoporosis, TIA and temporal arthritis who presented to the emergency room with acute onset of accidental mechanical fall with subsequent right hip pain.  She was having a bowel movement and was getting off of the commode when she lost her balance.  No head injuries.  She denies any presyncope or syncope.  No paresthesias or focal muscle weakness.  No chest pain or palpitations.  No cough or wheezing or hemoptysis.  No dysuria, oliguria or hematuria or flank pain.  No bleeding diathesis.  ED Course:  Upon presentation to the emergency room BP was 186/81 with respiratory rate of 21 and otherwise normal vital signs.  Labs revealed unremarkable BMP and CBC showed anemia slightly lower than previous levels and thrombocytosis likely reactive.  UA was unremarkable.  EKG as reviewed by me :EKG showed normal sinus rhythm with rate of 85 with PVC with poor R wave progression.  Imaging: Right knee x-ray showed tricompartment arthritis of the knee with severe lateral tibiofemoral joint space disease and trace knee effusion with no acute osseous abnormality.  Portable chest x-ray showed no acute cardiopulmonary disease.  Right hip x-ray revealed right femoral neck fracture.  The patient was given 4 mg of IV morphine sulfate twice, 4 mg of IV Zofran, 50 mcg of IV fentanyl, 1 g of p.o. Tylenol and hydration with IV normal saline at 100 mL/h.  Dr. Joette Catching was notified about the patient she will be ordered 2 g of  IV Ancef preoperatively.  She will be admitted to a medical-surgical bed for further.  The patient will be revoking hospice for further aggressive management. 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   Temporal arteritis (HCC)    Wears dentures    full top, partial bottom    PAST SURGICAL HISTORY:   Past Surgical History:  Procedure Laterality Date   ABDOMINAL HYSTERECTOMY     APPENDECTOMY     BACK SURGERY     lumbar and cervical laminectomies   BREAST BIOPSY Left 1960,1980   twice negative results   CAPSULOTOMY METATARSOPHALANGEAL Left 01/22/2015   Procedure: CAPSULOTOMY METATARSOPHALANGEAL;  Surgeon: Recardo Evangelist, DPM;  Location: [facility];  Service: Podiatry;  Laterality: Left;   CATARACT EXTRACTION W/PHACO Left 10/19/2016   Procedure: CATARACT EXTRACTION PHACO AND INTRAOCULAR LENS PLACEMENT (IOC)  Left Eye  Diabetic;  Surgeon: Lockie Mola, MD;  Location: [facility];  Service: Ophthalmology;  Laterality: Left;  Diabetic - oral meds Left Eye   CATARACT EXTRACTION W/PHACO Right 11/16/2016   Procedure: CATARACT EXTRACTION PHACO AND INTRAOCULAR LENS PLACEMENT (IOC) dIABETIC;  Surgeon: Lockie Mola, MD;  Location: [facility];  Service: Ophthalmology;  Laterality: Right;  DIABETES - oral meds   COLONOSCOPY WITH PROPOFOL N/A 04/24/2018   Procedure: COLONOSCOPY WITH PROPOFOL;  Surgeon: Toledo, Boykin Nearing, MD;  Location: [facility];  Service: Gastroenterology;  Laterality: N/A;   ESOPHAGOGASTRODUODENOSCOPY N/A 07/01/2021   Procedure:  ESOPHAGOGASTRODUODENOSCOPY (EGD);  Surgeon: Sung Amabile, DO;  Location: [facility] END[facility];  Service: General;  Laterality: N/A;   ESOPHAGOGASTRODUODENOSCOPY (EGD) WITH PROPOFOL N/A 04/24/2018   Procedure: ESOPHAGOGASTRODUODENOSCOPY (EGD) WITH PROPOFOL;  Surgeon: Toledo, Boykin Nearing, MD;  Location: [facility] END[facility]COPY;  Service: Gastroenterology;  Laterality: N/A;   FOOT SURGERY     HALLUX VALGUS AKIN Left 01/22/2015   Procedure: HALLUX VALGUS AKIN;  Surgeon: Recardo Evangelist, DPM;  Location: [facility];  Service: Podiatry;  Laterality: Left;  left great toe   HEMORRHOID SURGERY     KNEE ARTHROSCOPY     LUMBAR LAMINECTOMY  04/23/13   NOSE SURGERY     TONSILLECTOMY     URETER SURGERY      SOCIAL HISTORY:   Social History   Tobacco Use   Smoking status: Never   Smokeless tobacco: Never  Substance Use Topics   Alcohol use: No    FAMILY HISTORY:   Family History  Problem Relation Age of Onset   Diabetes Father    Stroke Father    Breast cancer Neg Hx     DRUG ALLERGIES:   Allergies  Allergen Reactions   Codeine Sulfate Nausea Only   Remicade [Infliximab] Other (See Comments)    Infection    REVIEW OF SYSTEMS:   ROS As per history of present illness. All pertinent systems were reviewed above. Constitutional, HEENT, cardiovascular, respiratory, GI, GU, musculoskeletal, neuro, psychiatric, endocrine, integumentary and hematologic systems were reviewed and are otherwise negative/unremarkable except for positive findings mentioned above in the HPI.   MEDICATIONS AT HOME:   Prior to Admission medications   Medication Sig Start Date End Date Taking? Authorizing Provider  acetaminophen (TYLENOL) 650 MG CR tablet Take 650 mg by mouth every 8 (eight) hours as needed for pain.    [provider]  aspirin 81 MG tablet Take 81 mg by mouth daily. AM    [provider]  atorvastatin (LIPITOR) 80 MG tablet Take 80 mg by mouth daily. PM    [provider]   calcium citrate-vitamin D (CITRACAL+D) 315-200 MG-UNIT per tablet Take 1 tablet by mouth daily.    [provider]  cholecalciferol (CHOLECALCIFEROL) 25 MCG tablet Take 1 tablet (1,000 Units total) by mouth daily. 04/13/22   Enedina Finner, MD  DULoxetine (CYMBALTA) 30 MG capsule Take 30 mg by mouth daily. 07/07/21   [provider]  folic acid (FOLVITE) 1 MG tablet Take 1 mg by mouth daily.    [provider]  gabapentin (NEURONTIN) 100 MG capsule Take 200 mg by mouth 2 (two) times daily.    [provider]  hydrochlorothiazide (HYDRODIURIL) 25 MG tablet Take 25 mg by mouth daily.    [provider]  latanoprost (XALATAN) 0.005 % ophthalmic solution Place 1 drop into both eyes at bedtime.    [provider]  metFORMIN (GLUCOPHAGE) 500 MG tablet Take 500 mg by mouth 2 (two) times daily with a meal.    [provider]  methotrexate (RHEUMATREX) 2.5 MG tablet Take 12.5 mg by mouth every Thursday.    [provider]  mirtazapine (REMERON) 15 MG tablet Take 15 mg by mouth at bedtime. 08/09/21   [provider]  pantoprazole (PROTONIX) 40 MG tablet Take 1 tablet (40 mg total) by mouth daily. 04/12/22   Enedina Finner, MD  Potassium Chloride ER 20 MEQ TBCR Take 1 tablet by mouth 2 (two) times daily. 02/19/22   [provider]  sertraline (ZOLOFT) 50 MG tablet Take 50 mg by mouth at bedtime.    [provider]  spironolactone (ALDACTONE) 25 MG tablet Take 25 mg by mouth daily.    [provider]  timolol (TIMOPTIC) 0.5 % ophthalmic solution Place 1 drop into both eyes 2 (two) times daily.    [provider]  tiZANidine (ZANAFLEX) 4 MG tablet Take 4 mg by mouth 3 (three) times daily as needed for muscle spasms. 06/22/21   [provider]  traMADol (ULTRAM) 50 MG tablet Take 50 mg by mouth daily as needed for moderate pain. 07/27/21   [provider]  vitamin B-12 (CYANOCOBALAMIN)  1000 MCG tablet Take 1,000 mcg by mouth daily.    [provider]      VITAL SIGNS:  Blood pressure (!) 195/78, pulse (!) 101, temperature 98 F (36.7 C), resp. rate 18, height 5\' 7"  (1.702 m), weight 60.7 kg, SpO2 94%.  PHYSICAL EXAMINATION:  Physical Exam  GENERAL:  82 y.o.-year-old Caucasian female patient lying in the bed with no acute distress.  EYES: Pupils equal, round, reactive to light and accommodation. No scleral icterus. Extraocular muscles intact.  HEENT: Head atraumatic, normocephalic. Oropharynx and nasopharynx clear.  NECK:  Supple, no jugular venous distention. No thyroid enlargement, no tenderness.  LUNGS: Normal breath sounds bilaterally, no wheezing, rales,rhonchi or crepitation. No use of accessory muscles of respiration.  CARDIOVASCULAR: Regular rate and rhythm, S1, S2 normal. No murmurs, rubs, or gallops.  ABDOMEN: Soft, nondistended, nontender. Bowel sounds present. No organomegaly or mass.  EXTREMITIES: No pedal edema, cyanosis, or clubbing.  NEUROLOGIC: Cranial nerves II through XII are intact. Muscle strength 5/5 in all extremities. Sensation intact. Gait not checked. Musculoskeletal: Right lateral hip tenderness. PSYCHIATRIC: The patient is alert and oriented x 3.  Normal affect and good eye contact. SKIN: No obvious rash, lesion, or ulcer.   LABORATORY PANEL:   CBC Recent Labs  Lab 05/20/23 2137  WBC 10.2  HGB 10.3*  HCT 35.1*  PLT 413*   ------------------------------------------------------------------------------------------------------------------  Chemistries  Recent Labs  Lab 05/20/23 2137  NA 138  K 3.9  CL 103  CO2 25  GLUCOSE 95  BUN 19  CREATININE 0.92  CALCIUM 9.1   ------------------------------------------------------------------------------------------------------------------  Cardiac Enzymes No results for input(s): "TROPONINI" in the last 168  hours. ------------------------------------------------------------------------------------------------------------------  RADIOLOGY:  CT HEAD WO CONTRAST ( )  Result Date: 05/21/2023 CLINICAL DATA:  Larey Seat from standing, head and neck trauma. EXAM: CT HEAD WITHOUT CONTRAST CT CERVICAL SPINE WITHOUT CONTRAST TECHNIQUE: Multidetector CT imaging of the head and cervical spine was performed following the standard protocol without intravenous contrast. Multiplanar CT image reconstructions of the cervical spine were also generated. RADIATION DOSE REDUCTION: This exam was performed according to the departmental dose-optimization program which includes automated exposure control, adjustment of the mA and/or kV according to patient size and/or use of iterative reconstruction technique. COMPARISON:  MRI brain 08/29/2009, with no prior cross-sectional imaging or films of the cervical spine. Limited comparison available with CTA chest 04/11/2022.  FINDINGS: CT HEAD FINDINGS Brain: There is chronic right posterior parieto-occipital encephalomalacia corresponding to the distribution of the previously acute infarct. There is additional wedge-shaped chronic infarct which was also previously acute, in the posteroinferior right cerebellar hemisphere and additional small scattered chronic bilateral cerebellar lacunar infarcts. There is mild cerebellar atrophy also increased as well as progressive moderate cerebral atrophy, small-vessel disease and atrophic ventriculomegaly. No asymmetry is seen worrisome for an acute cortical based infarct, hemorrhage, mass or mass effect and there is no midline shift. Basal cisterns are clear. Vascular: Moderate patchy calcific plaque both siphons. No hyperdense central vessels. Skull: Negative for fractures or focal lesions. There is calvarial osteopenia. There is no visible scalp hematoma. Sinuses/Orbits: Negative orbits except for interval bilateral lens replacements. Clear sinuses and  mastoids. Midline nasal septum. Other: None. CT CERVICAL SPINE FINDINGS Alignment: There is a trace retrolisthesis C3-4, C4-5 and C5-6, believed to be discogenic in etiology. No traumatic listhesis is suspected or other misalignment. Narrowing and spurring of the anterior atlantodental joint is noted, with the C1 lateral masses normally positioned on C2. Skull base and vertebrae: There is osteopenia. No acute fracture is evident. No primary bone lesion or focal pathologic process. There is mild anterior wedging of the C7, T1, and T2 vertebral bodies, but this was seen on the prior CTA chest unchanged. The other cervical vertebrae are normal in Incidentally noted is bone-on-bone degenerative arthrosis and mild remodeling of both TMJs. The patient is edentulous except for the low anteriors. Soft tissues and spinal canal: No prevertebral fluid or swelling. No visible canal hematoma. There are moderately heavy calcifications of the carotid bifurcations. No laryngeal mass. There is a 9 mm nodule in the right lobe of the thyroid gland. No follow-up imaging is recommended. Disc levels: There is mild disc space loss at C3-4 at C4-5. The other cervical discs are normal in heights. No herniated disc or cord compromise are seen. There is mild cervical spondylosis but no significant posterior disc osteophyte complex. Mild features of facet arthrosis are present but do not result in bony foraminal compromise. Upper chest: Negative. Other: None. IMPRESSION: 1. No acute intracranial CT findings or depressed skull fractures. 2. Chronic right posterior parieto-occipital encephalomalacia, and additional chronic infarct in the dorsal inferior right cerebellar hemisphere with bilateral cerebellar lacunae. 3. Progressive atrophy, small-vessel disease, and atrophic ventriculomegaly since 2010. 4. Osteopenia and degenerative change without evidence of cervical fractures or traumatic listhesis. 5. Mild chronic wedging of the C7, T1, and T2  vertebral bodies. 6. Carotid atherosclerosis. 7. 9 mm right thyroid nodule.  No follow-up imaging is recommended. Electronically Signed   By: Almira Bar M.D.   On: 05/21/2023 00:07   CT Cervical Spine Wo Contrast  Result Date: 05/21/2023 CLINICAL DATA:  Larey Seat from standing, head and neck trauma. EXAM: CT HEAD WITHOUT CONTRAST CT CERVICAL SPINE WITHOUT CONTRAST TECHNIQUE: Multidetector CT imaging of the head and cervical spine was performed following the standard protocol without intravenous contrast. Multiplanar CT image reconstructions of the cervical spine were also generated. RADIATION DOSE REDUCTION: This exam was performed according to the departmental dose-optimization program which includes automated exposure control, adjustment of the mA and/or kV according to patient size and/or use of iterative reconstruction technique. COMPARISON:  MRI brain 08/29/2009, with no prior cross-sectional imaging or films of the cervical spine. Limited comparison available with CTA chest 04/11/2022. FINDINGS: CT HEAD FINDINGS Brain: There is chronic right posterior parieto-occipital encephalomalacia corresponding to the distribution of the previously acute infarct. There is  additional wedge-shaped chronic infarct which was also previously acute, in the posteroinferior right cerebellar hemisphere and additional small scattered chronic bilateral cerebellar lacunar infarcts. There is mild cerebellar atrophy also increased as well as progressive moderate cerebral atrophy, small-vessel disease and atrophic ventriculomegaly. No asymmetry is seen worrisome for an acute cortical based infarct, hemorrhage, mass or mass effect and there is no midline shift. Basal cisterns are clear. Vascular: Moderate patchy calcific plaque both siphons. No hyperdense central vessels. Skull: Negative for fractures or focal lesions. There is calvarial osteopenia. There is no visible scalp hematoma. Sinuses/Orbits: Negative orbits except for interval  bilateral lens replacements. Clear sinuses and mastoids. Midline nasal septum. Other: None. CT CERVICAL SPINE FINDINGS Alignment: There is a trace retrolisthesis C3-4, C4-5 and C5-6, believed to be discogenic in etiology. No traumatic listhesis is suspected or other misalignment. Narrowing and spurring of the anterior atlantodental joint is noted, with the C1 lateral masses normally positioned on C2. Skull base and vertebrae: There is osteopenia. No acute fracture is evident. No primary bone lesion or focal pathologic process. There is mild anterior wedging of the C7, T1, and T2 vertebral bodies, but this was seen on the prior CTA chest unchanged. The other cervical vertebrae are normal in Incidentally noted is bone-on-bone degenerative arthrosis and mild remodeling of both TMJs. The patient is edentulous except for the low anteriors. Soft tissues and spinal canal: No prevertebral fluid or swelling. No visible canal hematoma. There are moderately heavy calcifications of the carotid bifurcations. No laryngeal mass. There is a 9 mm nodule in the right lobe of the thyroid gland. No follow-up imaging is recommended. Disc levels: There is mild disc space loss at C3-4 at C4-5. The other cervical discs are normal in heights. No herniated disc or cord compromise are seen. There is mild cervical spondylosis but no significant posterior disc osteophyte complex. Mild features of facet arthrosis are present but do not result in bony foraminal compromise. Upper chest: Negative. Other: None. IMPRESSION: 1. No acute intracranial CT findings or depressed skull fractures. 2. Chronic right posterior parieto-occipital encephalomalacia, and additional chronic infarct in the dorsal inferior right cerebellar hemisphere with bilateral cerebellar lacunae. 3. Progressive atrophy, small-vessel disease, and atrophic ventriculomegaly since 2010. 4. Osteopenia and degenerative change without evidence of cervical fractures or traumatic  listhesis. 5. Mild chronic wedging of the C7, T1, and T2 vertebral bodies. 6. Carotid atherosclerosis. 7. 9 mm right thyroid nodule.  No follow-up imaging is recommended. Electronically Signed   By: Almira Bar M.D.   On: 05/21/2023 00:07   CT Hip Right Wo Contrast  Result Date: 05/20/2023 CLINICAL DATA:  Trauma EXAM: CT OF THE RIGHT HIP WITHOUT CONTRAST TECHNIQUE: Multidetector CT imaging of the right hip was performed according to the standard protocol. Multiplanar CT image reconstructions were also generated. RADIATION DOSE REDUCTION: This exam was performed according to the departmental dose-optimization program which includes automated exposure control, adjustment of the mA and/or kV according to patient size and/or use of iterative reconstruction technique. COMPARISON:  Right hip x-ray same day FINDINGS: Bones/Joint/Cartilage There is an acute right femoral neck fracture. There is mild apex anterior angulation there is 1/2 shaft with posterior displacement of the distal fracture fragment. There is mild impaction. There are moderate degenerative changes of the right hip with joint space narrowing and osteophyte formation. Ligaments Suboptimally assessed by CT. Muscles and Tendons No focal hematoma or fluid collection. Soft tissues Peripheral vascular calcifications are present. There is a Foley catheter in the bladder. There  is sigmoid colon diverticulosis. IMPRESSION: Acute right femoral neck fracture. Electronically Signed   By: Darliss Cheney M.D.   On: 05/20/2023 23:48   DG Knee 2 Views Right  Result Date: 05/20/2023 CLINICAL DATA:  Fall EXAM: RIGHT KNEE - 1-2 VIEW COMPARISON:  None Available. FINDINGS: No fracture or malalignment. Vascular calcifications. 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 Trace knee effusion. IMPRESSION: 1. No acute osseous abnormality. 2. Tricompartment arthritis of the knee with severe lateral tibiofemoral joint space  disease. 3. Trace knee effusion. Electronically Signed   By: Jasmine Pang M.D.   On: 05/20/2023 22:35   DG Chest Portable 1 View  Result Date: 05/20/2023 CLINICAL DATA:  Fall EXAM: PORTABLE CHEST 1 VIEW COMPARISON:  CT chest 04/11/2022 FINDINGS: No acute airspace disease or effusion. Normal cardiomediastinal silhouette with aortic atherosclerosis. No pneumothorax. IMPRESSION: No active disease. Electronically Signed   By: Jasmine Pang M.D.   On: 05/20/2023 22:35   DG HIP UNILAT WITH PELVIS 2-3 VIEWS RIGHT  Result Date: 05/20/2023 CLINICAL DATA:  Fall with right-sided hip pain EXAM: DG HIP (WITH OR WITHOUT PELVIS) 2-3V RIGHT COMPARISON:  01/24/2015 FINDINGS: Right femoral head projects in joint. Pubic symphysis and rami appear intact. Findings suspicious for right femoral neck fracture. Advanced vascular calcifications IMPRESSION: Findings suspicious for right femoral neck fracture. Electronically Signed   By: Jasmine Pang M.D.   On: 05/20/2023 22:34      IMPRESSION AND PLAN:  Assessment and Plan: * Closed right hip fracture [facility]) - The patient be admitted to a medical-surgical bed. - Pain management will be provided. - Orthopedic consult will be obtained. - Dr. Joice Lofts was notified about the patient. - The patient has no history of CHF, CAD, renal failure with creatinine more than 2, diabetes mellitus on insulin.  She had a history of CVA.  She is at slightly increased risk than her average for age for perioperative cardiovascular events per the revised cardiac risk index.  She has no current pulmonary issues.  Hypertensive urgency - We will continue her antihypertensive therapy. - This could be related to pain. - She will be placed on as needed IV labetalol.  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 - We will continue Neurontin. - We will hold off metformin.  Dyslipidemia - We will continue  statin therapy.  Depression - We will continue Zoloft.  GERD without esophagitis - We will need PPI therapy.  Rheumatoid arthritis (HCC) - We will continue methotrexate.   DVT prophylaxis: Lovenox.  Advanced Care Planning:  Code Status: The patient is DNR and DNI. Family Communication:  The plan of care was discussed in details with the patient (and family). I answered all questions. The patient agreed to proceed with the above mentioned plan. Further management will depend upon hospital course. Disposition Plan: Back to previous home environment Consults called: Orthopedic surgery All the records are reviewed and case discussed with ED provider.  Status is: Inpatient  At the time of the admission, it appears that the appropriate admission status for this patient is inpatient.  This is judged to be reasonable and necessary in order to provide the required intensity of service to ensure the patient's safety given the presenting symptoms, physical exam findings and initial radiographic and laboratory data in the context of comorbid conditions.  The patient requires inpatient status due to high intensity of service, high risk of  further deterioration and high frequency of surveillance required.  I certify that at the time of admission, it is my clinical judgment that the patient will require inpatient hospital care extending more than 2 midnights.                            Dispo: The patient is from: Home              Anticipated d/c is to: Home              Patient currently is not medically stable to d/c.              Difficult to place patient: No  Hannah Beat M.D on 05/21/2023 at 1:11 AM  [facility]   From 7 PM-7 AM, contact night-coverage [email or web address]  CC: Primary care physician; Pcp, No

## 2023-05-20 NOTE — Assessment & Plan Note (Addendum)
--  Sliding scale Novolog  --Continue Neurontin --Hold metformin

## 2023-05-20 NOTE — ED Triage Notes (Signed)
patient to arrive from home via [facility], patient had a fall trying to remove her depend. Patient cc/o r hip pain at this time. R hip pain with shortening and rotation noted Vs w/ems 198/81 75 mcg fentanyl given by EMS aox4. no thinners

## 2023-05-20 NOTE — Assessment & Plan Note (Signed)
Continue methotrexate 

## 2023-05-20 NOTE — Assessment & Plan Note (Signed)
--  Mgmt per Orthopedic Surgery --Underwent Right hip unipolar hemiarthroplasty on 05/21/23 with Dr. Joice Lofts --Pain control PRN --Bowel regimen --PT/OT evaluations post-op --Ortho recommends: ----WBAT on right leg ----Lovenox, TED hose for DVT ppx

## 2023-05-20 NOTE — Assessment & Plan Note (Signed)
-   We will continue statin therapy. 

## 2023-05-21 ENCOUNTER — Other Ambulatory Visit: Payer: Self-pay

## 2023-05-21 ENCOUNTER — Inpatient Hospital Stay: Admitting: Anesthesiology

## 2023-05-21 ENCOUNTER — Inpatient Hospital Stay

## 2023-05-21 ENCOUNTER — Encounter
Admission: EM | Disposition: A | Discharge status: Skilled Nursing Facility | Payer: Self-pay | Source: Home / Self Care | Attending: Internal Medicine

## 2023-05-21 DIAGNOSIS — E1142 Type 2 diabetes mellitus with diabetic polyneuropathy: Secondary | ICD-10-CM | POA: Diagnosis not present

## 2023-05-21 DIAGNOSIS — S72001A Fracture of unspecified part of neck of right femur, initial encounter for closed fracture: Secondary | ICD-10-CM | POA: Diagnosis not present

## 2023-05-21 DIAGNOSIS — Z515 Encounter for palliative care: Secondary | ICD-10-CM | POA: Diagnosis not present

## 2023-05-21 DIAGNOSIS — K219 Gastro-esophageal reflux disease without esophagitis: Secondary | ICD-10-CM | POA: Diagnosis present

## 2023-05-21 DIAGNOSIS — I160 Hypertensive urgency: Secondary | ICD-10-CM | POA: Diagnosis present

## 2023-05-21 HISTORY — PX: HIP ARTHROPLASTY: SHX981

## 2023-05-21 LAB — BASIC METABOLIC PANEL
Anion gap: 8 (ref 5–15)
BUN: 18 mg/dL (ref 8–23)
CO2: 28 mmol/L (ref 22–32)
Calcium: 8.6 mg/dL — ABNORMAL LOW (ref 8.9–10.3)
Chloride: 103 mmol/L (ref 98–111)
Creatinine, Ser: 0.84 mg/dL (ref 0.44–1.00)
GFR, Estimated: >60 mL/min (ref >60)
Glucose, Bld: 119 mg/dL — ABNORMAL HIGH (ref 70–99)
Potassium: 3.7 mmol/L (ref 3.5–5.1)
Sodium: 139 mmol/L (ref 135–145)

## 2023-05-21 LAB — CBC
HCT: 30.8 % — ABNORMAL LOW (ref 36.0–46.0)
Hemoglobin: 9.3 g/dL — ABNORMAL LOW (ref 12.0–15.0)
MCH: 25.5 pg — ABNORMAL LOW (ref 26.0–34.0)
MCHC: 30.2 g/dL (ref 30.0–36.0)
MCV: 84.6 fL (ref 80.0–100.0)
Platelets: 310 10*3/uL (ref 150–400)
RBC: 3.64 MIL/uL — ABNORMAL LOW (ref 3.87–5.11)
RDW: 15.9 % — ABNORMAL HIGH (ref 11.5–15.5)
WBC: 5.4 10*3/uL (ref 4.0–10.5)
nRBC: 0 % (ref 0.0–0.2)

## 2023-05-21 SURGERY — HEMIARTHROPLASTY, HIP, DIRECT ANTERIOR APPROACH, FOR FRACTURE
Anesthesia: Spinal | Site: Hip | Laterality: Right

## 2023-05-21 MED ORDER — BUPIVACAINE-EPINEPHRINE (PF) 0.5% -1:200000 IJ SOLN
INTRAMUSCULAR | Status: AC
  Filled 2023-05-21: qty 30

## 2023-05-21 MED ORDER — EPHEDRINE SULFATE (PRESSORS) 50 MG/ML IJ SOLN
INTRAMUSCULAR | Status: DC | PRN
  Administered 2023-05-21: 15 mg via INTRAVENOUS
  Administered 2023-05-21: 10 mg via INTRAVENOUS

## 2023-05-21 MED ORDER — TRANEXAMIC ACID-NACL 1000-0.7 MG/100ML-% IV SOLN
INTRAVENOUS | Status: AC
  Filled 2023-05-21: qty 100

## 2023-05-21 MED ORDER — DOCUSATE SODIUM 100 MG PO CAPS
100.0000 mg | ORAL_CAPSULE | Freq: Two times a day (BID) | ORAL | Status: DC
  Administered 2023-05-21 – 2023-05-25 (×9): 100 mg via ORAL
  Filled 2023-05-21 (×9): qty 1

## 2023-05-21 MED ORDER — SODIUM CHLORIDE FLUSH 0.9 % IV SOLN
INTRAVENOUS | Status: AC
  Filled 2023-05-21: qty 10

## 2023-05-21 MED ORDER — ACETAMINOPHEN 10 MG/ML IV SOLN
INTRAVENOUS | Status: DC | PRN
  Administered 2023-05-21: 1000 mg via INTRAVENOUS

## 2023-05-21 MED ORDER — CEFAZOLIN SODIUM-DEXTROSE 2-4 GM/100ML-% IV SOLN
INTRAVENOUS | Status: AC
  Filled 2023-05-21: qty 100

## 2023-05-21 MED ORDER — KETOROLAC TROMETHAMINE 30 MG/ML IJ SOLN
INTRAMUSCULAR | Status: AC
  Filled 2023-05-21: qty 1

## 2023-05-21 MED ORDER — ACETAMINOPHEN 10 MG/ML IV SOLN
INTRAVENOUS | Status: AC
  Filled 2023-05-21: qty 100

## 2023-05-21 MED ORDER — ACETAMINOPHEN 500 MG PO TABS
500.0000 mg | ORAL_TABLET | Freq: Four times a day (QID) | ORAL | Status: AC
  Administered 2023-05-21 – 2023-05-22 (×4): 500 mg via ORAL
  Filled 2023-05-21 (×4): qty 1

## 2023-05-21 MED ORDER — OXYCODONE HCL 5 MG PO TABS: 5.0000 mg | ORAL_TABLET | Freq: Once | ORAL | Status: DC | PRN

## 2023-05-21 MED ORDER — VASOPRESSIN 20 UNIT/ML IV SOLN
INTRAVENOUS | Status: DC | PRN
Start: 2023-05-21 — End: 2023-05-21
  Administered 2023-05-21: 2 [IU] via INTRAVENOUS

## 2023-05-21 MED ORDER — MAGNESIUM HYDROXIDE 400 MG/5ML PO SUSP: 30.0000 mL | Freq: Every day | ORAL | Status: DC | PRN

## 2023-05-21 MED ORDER — ONDANSETRON HCL 4 MG/2ML IJ SOLN
INTRAMUSCULAR | Status: AC
  Filled 2023-05-21: qty 8

## 2023-05-21 MED ORDER — PROPOFOL 500 MG/50ML IV EMUL
INTRAVENOUS | Status: DC | PRN
  Administered 2023-05-21: 30 ug/kg/min via INTRAVENOUS

## 2023-05-21 MED ORDER — TRANEXAMIC ACID-NACL 1000-0.7 MG/100ML-% IV SOLN
1000.0000 mg | INTRAVENOUS | Status: AC
  Administered 2023-05-21: 1000 mg via INTRAVENOUS

## 2023-05-21 MED ORDER — TRIAMCINOLONE ACETONIDE 40 MG/ML IJ SUSP
INTRAMUSCULAR | Status: AC
  Filled 2023-05-21: qty 2

## 2023-05-21 MED ORDER — LABETALOL HCL 5 MG/ML IV SOLN: 20.0000 mg | INTRAVENOUS | Status: DC | PRN

## 2023-05-21 MED ORDER — PROPOFOL 1000 MG/100ML IV EMUL
INTRAVENOUS | Status: AC
  Filled 2023-05-21: qty 100

## 2023-05-21 MED ORDER — BISACODYL 10 MG RE SUPP: 10.0000 mg | Freq: Every day | RECTAL | Status: DC | PRN

## 2023-05-21 MED ORDER — ONDANSETRON HCL 4 MG/2ML IJ SOLN: 4.0000 mg | Freq: Four times a day (QID) | INTRAMUSCULAR | Status: DC | PRN

## 2023-05-21 MED ORDER — METOCLOPRAMIDE HCL 5 MG PO TABS: 5.0000 mg | ORAL_TABLET | Freq: Three times a day (TID) | ORAL | Status: DC | PRN

## 2023-05-21 MED ORDER — ENOXAPARIN SODIUM 40 MG/0.4ML IJ SOSY
40.0000 mg | PREFILLED_SYRINGE | INTRAMUSCULAR | Status: DC
  Administered 2023-05-22 – 2023-05-25 (×4): 40 mg via SUBCUTANEOUS
  Filled 2023-05-21 (×4): qty 0.4

## 2023-05-21 MED ORDER — FENTANYL CITRATE (PF) 100 MCG/2ML IJ SOLN: 25.0000 ug | INTRAMUSCULAR | Status: DC | PRN

## 2023-05-21 MED ORDER — BUPIVACAINE HCL (PF) 0.5 % IJ SOLN
INTRAMUSCULAR | Status: DC | PRN
  Administered 2023-05-21: 2.5 mL

## 2023-05-21 MED ORDER — PHENYLEPHRINE HCL-NACL 20-0.9 MG/250ML-% IV SOLN
INTRAVENOUS | Status: AC
  Filled 2023-05-21: qty 250

## 2023-05-21 MED ORDER — LACTATED RINGERS IV SOLN
INTRAVENOUS | Status: DC | PRN
Start: 2023-05-21 — End: 2023-05-21

## 2023-05-21 MED ORDER — ONDANSETRON HCL 4 MG/2ML IJ SOLN
INTRAMUSCULAR | Status: DC | PRN
  Administered 2023-05-21: 4 mg via INTRAVENOUS

## 2023-05-21 MED ORDER — SODIUM CHLORIDE 0.9 % IR SOLN
Status: DC | PRN
  Administered 2023-05-21: 3000 mL

## 2023-05-21 MED ORDER — TRIAMCINOLONE ACETONIDE 40 MG/ML IJ SUSP
INTRAMUSCULAR | Status: DC | PRN
  Administered 2023-05-21: 63 mL via INTRAMUSCULAR

## 2023-05-21 MED ORDER — ACETAMINOPHEN 325 MG PO TABS
325.0000 mg | ORAL_TABLET | Freq: Four times a day (QID) | ORAL | Status: DC | PRN
  Administered 2023-05-23 – 2023-05-24 (×2): 650 mg via ORAL
  Filled 2023-05-21 (×2): qty 2

## 2023-05-21 MED ORDER — BUPIVACAINE LIPOSOME 1.3 % IJ SUSP
INTRAMUSCULAR | Status: AC
  Filled 2023-05-21: qty 20

## 2023-05-21 MED ORDER — FLEET ENEMA RE ENEM: 1.0000 | ENEMA | Freq: Once | RECTAL | Status: DC | PRN

## 2023-05-21 MED ORDER — ONDANSETRON HCL 4 MG PO TABS: 4.0000 mg | ORAL_TABLET | Freq: Four times a day (QID) | ORAL | Status: DC | PRN

## 2023-05-21 MED ORDER — DIPHENHYDRAMINE HCL 12.5 MG/5ML PO ELIX: 12.5000 mg | ORAL_SOLUTION | ORAL | Status: DC | PRN

## 2023-05-21 MED ORDER — PROPOFOL 10 MG/ML IV BOLUS
INTRAVENOUS | Status: DC | PRN
  Administered 2023-05-21 (×2): 30 mg via INTRAVENOUS

## 2023-05-21 MED ORDER — PHENYLEPHRINE HCL-NACL 20-0.9 MG/250ML-% IV SOLN
INTRAVENOUS | Status: DC | PRN
  Administered 2023-05-21: 30 ug/min via INTRAVENOUS

## 2023-05-21 MED ORDER — SODIUM CHLORIDE 0.9 % IV SOLN: INTRAVENOUS | Status: DC

## 2023-05-21 MED ORDER — NOREPINEPHRINE BITARTRATE 1 MG/ML IV SOLN
INTRAVENOUS | Status: AC
  Filled 2023-05-21: qty 4

## 2023-05-21 MED ORDER — CEFAZOLIN SODIUM-DEXTROSE 2-4 GM/100ML-% IV SOLN
2.0000 g | Freq: Four times a day (QID) | INTRAVENOUS | Status: AC
  Administered 2023-05-21 – 2023-05-22 (×3): 2 g via INTRAVENOUS
  Filled 2023-05-21 (×3): qty 100

## 2023-05-21 MED ORDER — OXYCODONE HCL 5 MG/5ML PO SOLN: 5.0000 mg | Freq: Once | ORAL | Status: DC | PRN

## 2023-05-21 MED ORDER — 0.9 % SODIUM CHLORIDE (POUR BTL) OPTIME
TOPICAL | Status: DC | PRN
  Administered 2023-05-21: 1000 mL

## 2023-05-21 MED ORDER — METOCLOPRAMIDE HCL 5 MG/ML IJ SOLN: 5.0000 mg | Freq: Three times a day (TID) | INTRAMUSCULAR | Status: DC | PRN

## 2023-05-21 MED ORDER — PROPOFOL 10 MG/ML IV BOLUS
INTRAVENOUS | Status: AC
  Filled 2023-05-21: qty 20

## 2023-05-21 SURGICAL SUPPLY — 64 items
APL PRP STRL LF DISP 70% ISPRP (MISCELLANEOUS) ×2
BAG DECANTER FOR FLEXI CONT (MISCELLANEOUS) IMPLANT
BLADE SAGITTAL WIDE XTHICK NO (BLADE) ×1 IMPLANT
BLADE SAW SAG 25.4X90 (BLADE) ×1 IMPLANT
BLADE SURG SZ20 CARB STEEL (BLADE) ×1 IMPLANT
BOWL CEMENT MIXING ADV NOZZLE (MISCELLANEOUS) IMPLANT
CEMENT BONE 40GM (Cement) IMPLANT
CEMENT RESTRICTOR DEPUY SZ 3 (Cement) IMPLANT
CHLORAPREP W/TINT 26 (MISCELLANEOUS) ×2 IMPLANT
DRAPE IMP U-DRAPE 54X76 (DRAPES) ×1 IMPLANT
DRAPE INCISE IOBAN 66X60 STRL (DRAPES) ×1 IMPLANT
DRAPE ORTHO SPLIT 77X108 STRL (DRAPES) ×2
DRAPE SURG 17X11 SM STRL (DRAPES) ×1 IMPLANT
DRAPE SURG 17X23 STRL (DRAPES) ×1 IMPLANT
DRAPE SURG ORHT 6 SPLT 77X108 (DRAPES) IMPLANT
DRSG OPSITE POSTOP 4X10 (GAUZE/BANDAGES/DRESSINGS) IMPLANT
DRSG OPSITE POSTOP 4X12 (GAUZE/BANDAGES/DRESSINGS) IMPLANT
DRSG OPSITE POSTOP 4X8 (GAUZE/BANDAGES/DRESSINGS) IMPLANT
ELECT BLADE 6.5 EXT (BLADE) IMPLANT
ELECT CAUTERY BLADE 6.4 (BLADE) ×1 IMPLANT
ELECT REM PT RETURN 9FT ADLT (ELECTROSURGICAL) ×1
ELECTRODE REM PT RTRN 9FT ADLT (ELECTROSURGICAL) ×1 IMPLANT
GAUZE PACK 2X3YD (PACKING) IMPLANT
GAUZE XEROFORM 1X8 LF (GAUZE/BANDAGES/DRESSINGS) ×1 IMPLANT
GLOVE BIO SURGEON STRL SZ8 (GLOVE) ×3 IMPLANT
GLOVE BIOGEL M STRL SZ7.5 (GLOVE) IMPLANT
GLOVE BIOGEL PI IND STRL 8 (GLOVE) ×1 IMPLANT
GOWN STRL REUS W/ TWL LRG LVL3 (GOWN DISPOSABLE) ×1 IMPLANT
GOWN STRL REUS W/ TWL XL LVL3 (GOWN DISPOSABLE) ×1 IMPLANT
GOWN STRL REUS W/TWL LRG LVL3 (GOWN DISPOSABLE) ×1
GOWN STRL REUS W/TWL XL LVL3 (GOWN DISPOSABLE) ×1
HEAD ENDO II MOD SZ 47 (Orthopedic Implant) IMPLANT
HOOD PEEL AWAY T7 (MISCELLANEOUS) ×3 IMPLANT
INSERT TAPER ENDO II STD (Orthopedic Implant) IMPLANT
IV NS 100ML SINGLE PACK (IV SOLUTION) IMPLANT
IV NS IRRIG 3000ML ARTHROMATIC (IV SOLUTION) ×2 IMPLANT
KIT PREP HIP W/CEMENT RESTRICT (Miscellaneous) IMPLANT
LABEL OR SOLS (LABEL) ×1 IMPLANT
MANIFOLD NEPTUNE II (INSTRUMENTS) ×1 IMPLANT
NDL FILTER BLUNT 18X1 1/2 (NEEDLE) ×1 IMPLANT
NDL SAFETY ECLIP 18X1.5 (MISCELLANEOUS) ×1 IMPLANT
NDL SPNL 20GX3.5 QUINCKE YW (NEEDLE) ×1 IMPLANT
NEEDLE FILTER BLUNT 18X1 1/2 (NEEDLE) ×1 IMPLANT
NEEDLE SPNL 20GX3.5 QUINCKE YW (NEEDLE) ×1 IMPLANT
NS IRRIG 500ML POUR BTL (IV SOLUTION) ×1 IMPLANT
PACK HIP PROSTHESIS (MISCELLANEOUS) ×1 IMPLANT
PULSAVAC PLUS IRRIG FAN TIP (DISPOSABLE) ×1
SPIKE FLUID TRANSFER (MISCELLANEOUS) ×2 IMPLANT
SPONGE T-LAP 18X18 ~~LOC~~+RFID (SPONGE) ×2 IMPLANT
STAPLER SKIN PROX 35W (STAPLE) ×1 IMPLANT
STEM CENTRALIZER DISTAL SZ10 (Stem) IMPLANT
STEM HIP FEMORAL 9MMX130MM (Stem) IMPLANT
STRAP SAFETY 5IN WIDE (MISCELLANEOUS) ×1 IMPLANT
SUT TICRON 2-0 30IN 311381 (SUTURE) ×4 IMPLANT
SUT VIC AB 0 CT1 36 (SUTURE) ×1 IMPLANT
SUT VIC AB 1 CT1 36 (SUTURE) ×1 IMPLANT
SUT VIC AB 2-0 CT1 (SUTURE) ×2 IMPLANT
SYR 10ML LL (SYRINGE) ×1 IMPLANT
SYR 30ML LL (SYRINGE) ×3 IMPLANT
SYR TB 1ML LL NO SAFETY (SYRINGE) IMPLANT
TIP BRUSH PULSAVAC PLUS 24.33 (MISCELLANEOUS) IMPLANT
TIP FAN IRRIG PULSAVAC PLUS (DISPOSABLE) ×1 IMPLANT
TRAP FLUID SMOKE EVACUATOR (MISCELLANEOUS) ×2 IMPLANT
WATER STERILE IRR 1000ML POUR (IV SOLUTION) ×1 IMPLANT

## 2023-05-21 NOTE — Anesthesia Preprocedure Evaluation (Addendum)
Anesthesia Evaluation  Patient identified by MRN, date of birth, ID band Patient awake    Reviewed: Allergy & Precautions, NPO status , Patient's Chart, lab work & pertinent test results  History of Anesthesia Complications Negative for: history of anesthetic complications  Airway Mallampati: III   Neck ROM: full  Mouth opening: Limited Mouth Opening  Dental  (+) Partial Lower, Partial Upper, Poor Dentition   Pulmonary neg pulmonary ROS   Pulmonary exam normal        Cardiovascular hypertension, On Medications Normal cardiovascular exam     Neuro/Psych  PSYCHIATRIC DISORDERS  Depression     Neuromuscular disease CVA, No Residual Symptoms    GI/Hepatic Neg liver ROS,GERD  ,,  Endo/Other  diabetes, Type 2, Oral Hypoglycemic Agents    Renal/GU      Musculoskeletal   Abdominal   Peds  Hematology  (+) Blood dyscrasia, anemia   Anesthesia Other Findings Past Medical History: No date: Anemia No date: Arthritis     Comment:  rhuematoid,OSTEOARTHRITIS No date: Cellulitis No date: 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 No date: GERD (gastroesophageal reflux disease) No date: Gout No date: Hiatal hernia No date: Hyperlipidemia No date: Hypertension No date: 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 No date: Osteoporosis No date: Rosacea No date: Spinal stenosis of lumbar region No date: Stroke [facility])     Comment:  x2, 9/10 No date: Temporal arteritis (HCC) No date: Wears dentures     Comment:  full top, partial bottom  Past Surgical History: No date: ABDOMINAL HYSTERECTOMY No date: APPENDECTOMY No date: BACK SURGERY     Comment:  lumbar and cervical laminectomies [id number]: BREAST BIOPSY; Left     Comment:  twice negative results 01/22/2015: CAPSULOTOMY METATARSOPHALANGEAL; Left     Comment:  Procedure: CAPSULOTOMY METATARSOPHALANGEAL;  Surgeon:                Recardo Evangelist, DPM;  Location: [facility];                Service: Podiatry;  Laterality: Left; 10/19/2016: CATARACT EXTRACTION W/PHACO; Left     Comment:  Procedure: CATARACT EXTRACTION PHACO AND INTRAOCULAR               LENS PLACEMENT (IOC)  Left Eye  Diabetic;  Surgeon:               Lockie Mola, MD;  Location: [facility];               Service: Ophthalmology;  Laterality: Left;  Diabetic -               oral meds Left Eye 11/16/2016: CATARACT EXTRACTION W/PHACO; Right     Comment:  Procedure: CATARACT EXTRACTION PHACO AND INTRAOCULAR               LENS PLACEMENT (IOC) dIABETIC;  Surgeon: Lockie Mola, MD;  Location: [facility];  Service:              Ophthalmology;  Laterality: Right;  DIABETES - oral meds 04/24/2018: COLONOSCOPY WITH PROPOFOL; N/A     Comment:  Procedure: COLONOSCOPY WITH PROPOFOL;  Surgeon: Tol[place],               Boykin Nearing, MD;  Location: [facility];  Service:  Gastroenterology;  Laterality: N/A; 07/01/2021: ESOPHAGOGASTRODUODENOSCOPY; N/A     Comment:  Procedure: ESOPHAGOGASTRODUODENOSCOPY (EGD);  Surgeon:               Sung Amabile, DO;  Location: [facility];  Service:               General;  Laterality: N/A; 04/24/2018: ESOPHAGOGASTRODUODENOSCOPY (EGD) WITH PROPOFOL; N/A     Comment:  Procedure: ESOPHAGOGASTRODUODENOSCOPY (EGD) WITH               PROPOFOL;  Surgeon: Toledo, Boykin Nearing, MD;  Location:               [facility];  Service: Gastroenterology;  Laterality:               N/A; No date: FOOT SURGERY 01/22/2015: HALLUX VALGUS AKIN; Left     Comment:  Procedure: HALLUX VALGUS AKIN;  Surgeon: Recardo Evangelist, DPM;  Location: [facility];  Service:               Podiatry;  Laterality: Left;  left great toe No date: HEMORRHOID SURGERY No date: KNEE ARTHROSCOPY 04/23/13: LUMBAR LAMINECTOMY No date: NOSE SURGERY No date: TONSILLECTOMY No date: URETER  SURGERY  BMI    Body Mass Index: 20.97 kg/m      Reproductive/Obstetrics negative OB ROS                             Anesthesia Physical Anesthesia Plan  ASA: 2  Anesthesia Plan: Spinal   Post-op Pain Management: Regional block*, Toradol IV (intra-op)* and Ofirmev IV (intra-op)*   Induction: Intravenous  PONV Risk Score and Plan: 2 and Propofol infusion, TIVA and Treatment may vary due to age or medical condition  Airway Management Planned: Natural Airway and Nasal Cannula  Additional Equipment:   Intra-op Plan:   Post-operative Plan:   Informed Consent: I have reviewed the patients History and Physical, chart, labs and discussed the procedure including the risks, benefits and alternatives for the proposed anesthesia with the patient or authorized representative who has indicated his/her understanding and acceptance.     Dental Advisory Given  Plan Discussed with: Anesthesiologist, CRNA and Surgeon  Anesthesia Plan Comments: (Patient reports no bleeding problems and no anticoagulant use.  Plan for spinal with backup GA  Patient consented for risks of anesthesia including but not limited to:  - adverse reactions to medications - damage to eyes, teeth, lips or other oral mucosa - nerve damage due to positioning  - risk of bleeding, infection and or nerve damage from spinal that could lead to paralysis - risk of headache or failed spinal - damage to teeth, lips or other oral mucosa - sore throat or hoarseness - damage to heart, brain, nerves, lungs, other parts of body or loss of life  Patient voiced understanding.)        Anesthesia Quick Evaluation

## 2023-05-21 NOTE — Assessment & Plan Note (Signed)
Continue Zoloft 

## 2023-05-21 NOTE — Assessment & Plan Note (Signed)
Hx of Hypertension BP on initially 186/81, later 195/78, suspect due to pain from hip fracture. BP's more controlled, stable --Continue hydrochlorothiazide, spironolactone --IV labetalol PRN

## 2023-05-21 NOTE — Assessment & Plan Note (Signed)
Protonix

## 2023-05-21 NOTE — Progress Notes (Signed)
Civil engineer, contracting Hospitalized Hospice Patient   Wanda Monroe is a current hospice patient followed at home for terminal diagnosis of respiratory failure.  Patient suffered a fall on 9.7.24 and was sent to the ED by hospice nurse for further examination and testing.  Patient was admitted 9.7.24 with right hip fracture.  Patient with planned surgical intervention today.  Per Dr. Gordy Savers, hospice physician, this is a related hospital admission.  Patient pre-op for repair of right hip fracture.  Patient suffered a fall last evening and had a visit from hospice RN.  No family present at the hospital.  I called and spoke with Worthy Keeler, hospice primary caregiver to discuss hospital liaison role.  He states he is Ms. Hochstatter's neighbor and he is her primary caregiver.  He states he works out of town a lot and states his wish for patient to have rehab if she is able, due to patient lives by herself and she will need to be as mobil as possible.  I discussed with him hospice Medicare benefit and Medicare rehab/skilled days.  He verbalized understanding and will likely want to revoke her hospice benefit if she qualifies for skilled rehab days.    Patient is appropriate for hospice GIP requiring skilled interventions following right hip fracture to manage pain and ongoing assessment of symptoms.   Vital Signs:  T-98.3 oral, BP 186/81, P 84, R21, Oxi 100%                        Weight: 133#  Abnormal labs:  HGB 10.3, HCT 35.1, PLT 413  Diagnostics:   Right knee x-ray showed tricompartment arthritis of the knee with severe lateral tibiofemoral joint space disease and trace knee effusion with no acute osseous abnormality.  Portable chest x-ray showed no acute cardiopulmonary disease.  Right hip x-ray revealed right femoral neck fracture.   EKG:  EKG showed normal sinus rhythm with rate of 85 with PVC with poor R wave progression.   IV/PRN Meds:  Morphine 4mg  IV x 2 for pain Zofran 4mg  IV-  nausea Fentanyl IV pain Ancef 2g pre-op  Problem list:    * Closed right hip fracture [facility]) - The patient be admitted to a medical-surgical bed. - Pain management will be provided. - Orthopedic consult will be obtained. - Dr. Joice Lofts was notified about the patient. - The patient has no history of CHF, CAD, renal failure with creatinine more than 2, diabetes mellitus on insulin.  She had a history of CVA.  She is at slightly increased risk than her average for age for perioperative cardiovascular events per the revised cardiac risk index.  She has no current pulmonary issues.   Hypertensive urgency - We will continue her antihypertensive therapy. - This could be related to pain. - She will be placed on as needed IV labetalol.   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 - We will continue Neurontin. - We will hold off metformin.   Dyslipidemia - We will continue statin therapy.  Depression - We will continue Zoloft.   GERD without esophagitis - We will need PPI therapy.   Rheumatoid arthritis (HCC) - We will continue methotrexate.   Discharge Planning:  Ongoing-   Family contact:  spoke with patient's primary caregiver for hospice- Worthy Keeler.  He is currently driving to a conference. He will be available by phone and ask  that a message be left for him if he does not answer the phone.  Thereasa Distance should be primary contact for hospital.  IDT:  Updated    Goals of Care- comfort approach.  Thereasa Distance does state he feels patient will need rehab- as he is her neighbor, primary caregiver, and she will need to be able to ambulate.   Medication list given to ED personnel.   Norris Cross, RN Nurse Liaison

## 2023-05-21 NOTE — Assessment & Plan Note (Signed)
[facility] Liaison is following. Appreciate input and care.

## 2023-05-21 NOTE — Plan of Care (Signed)

## 2023-05-21 NOTE — Op Note (Signed)
05/21/2023  10:23 AM  Patient:   Wanda Monroe  Pre-Op Diagnosis:   Displaced femoral neck fracture, right hip.  Post-Op Diagnosis:   Same.  Procedure:   Right hip unipolar hemiarthroplasty.  Surgeon:   Maryagnes Amos, MD  Assistant:   Dedra Skeens, PA-C  Anesthesia:   Spinal  Findings:   As above.  Complications:   None  EBL:   50 cc  Fluids:   500 cc crystalloid  UOP:   None  TT:   None  Drains:   None  Closure:   Staples  Implants:   Biomet cemented system with a #9 lateral offset cemented echo femoral stem, a 47 mm outer diameter shell, and a +0 mm neck adapter.  Brief Clinical Note:   The patient is an 82 year old female who sustained the above-noted injury last evening when she apparently lost her balance and fell while trying to get up from her commode.  She was brought to the emergency room where x-rays of her right hip were obtained and demonstrated the findings as described above.  The patient has been cleared medically and presents at this time for definitive management of the injury.  Procedure:   The patient was brought into the operating room.  After adequate spinal anesthesia was obtained, the patient was repositioned in the left lateral decubitus position and secured using a lateral hip positioner. The right hip and lower extremity were prepped with ChloroPrep solution before being draped sterilely. Preoperative antibiotics were administered. A timeout was performed to verify the appropriate surgical site.    A standard posterior approach to the hip was made through an approximately 4-5 inch incision. The incision was carried down through the subcutaneous tissues to expose the gluteal fascia and proximal end of the iliotibial band. These structures were split the length of the incision and the Charnley self-retaining hip retractor placed. The bursal tissues were swept posteriorly to expose the short external rotators. The anterior border of the piriformis tendon  was identified and this plane developed down through the capsule to enter the joint. Abundant fracture hematoma was suctioned. A flap of tissue was elevated off the posterior aspect of the femoral neck and greater trochanter and retracted posteriorly. This flap included the piriformis tendon, the short external rotators, and the posterior capsule. The femoral head was removed in its entirety, then taken to the back table where it was measured and found to be optimally replicated by a 47 mm head. The appropriate trial head was inserted and found to demonstrate an excellent suction fit.   Attention was directed to the femoral side. The femoral neck was recut 10-12 mm above the lesser trochanter using an oscillating saw. The piriformis fossa was debrided of soft tissues before the intramedullary canal was accessed through this point using a triple step reamer. The canal was reamed sequentially beginning with a #7 tapered reamer and progressing to a #11 tapered reamer. This provided excellent circumferential chatter. A box osteotome was used to establish version before the canal was broached sequentially beginning with a #7 broach and progressing to a #11 broach. This was left in place and several trial reductions performed.   Given the patient's age and quality of bone noted in the proximal femur, it was felt best to proceed with a cemented femoral stem.  The femoral canal was prepared for cementing by irrigating it thoroughly before using a bottle brush to gently grasp of the canal.  The canal was sounded and found  to be best replicated by a #3 sound.  Therefore, the #3 cement restrictor was placed and advanced to the appropriate depth.  The canal was packed with a Neo-Synephrine soaked gauze.  Meanwhile, the cement was mixed on the back table.  When the cement was ready, it was injected into the femoral canal and pressurized.  The permanent #9 lateral offset cemented Echo femoral stem was inserted and held in  place until the cement hardened.  The excess cement was removed using the appropriate curettes and elevators.    Once the cement had hardened, A repeat trial reduction was performed using both the -6 mm and +0 mm neck lengths. The +0 mm neck length demonstrated excellent stability both in extension and external rotation as well as with flexion to 90 and internal rotation beyond 70. It also was stable in the position of sleep. The 47 mm outer diameter shell with the +0 mm neck adapter construct was put together on the back table before being impacted onto the stem of the femoral component. The Morse taper locking mechanism was verified using manual distraction before the head was relocated and the hip placed through a range of motion with the findings as described above.  The wound was copiously irrigated with sterile saline solution via the jet lavage system before the peri-incisional and pericapsular tissues were injected with a "cocktail" of 20 cc of Exparel, 30 cc of 0.5% Sensorcaine, 2 cc of Kenalog 40 (80 mg), and 30 mg of Toradol diluted out to 60 cc with normal saline was injected to help with postoperative analgesia. The posterior flap was reapproximated to the posterior aspect of the greater trochanter using #2 Tycron interrupted sutures placed through drill holes. The iliotibial band was reapproximated using #1 Vicryl interrupted sutures before the gluteal fascia was closed using a running #1 Vicryl suture. The subcutaneous tissues were closed in several layers using 2-0 Vicryl interrupted sutures before the skin was closed using staples. A sterile occlusive dressing was applied to the wound . The patient then was rolled back into the supine position on the hospital bed before being awakened and returned to the recovery room in satisfactory condition after tolerating the procedure well.

## 2023-05-21 NOTE — Progress Notes (Signed)
  Progress Note   Patient: Wanda Monroe ZOX:[id number] DOB: 11-20-1940 DOA: 05/20/2023     1 DOS: the patient was seen and examined on 05/21/2023   Brief hospital course: HPI on admission 05/20/23 PM: "Wanda Monroe is a 82 y.o. Caucasian female with medical history significant for type diabetes mellitus, GERD, hypertension, gout, dyslipidemia, osteoporosis, TIA and temporal arthritis who presented to the emergency room with acute onset of accidental mechanical fall with subsequent right hip pain.  She was having a bowel movement and was getting off of the commode when she lost her balance.  No head injuries.  She denies any presyncope or syncope. ..."  ED course - BP 186/81, RR 21 otherwise normal vitals.  BMP was unremarkable.  CBC showed chronic anemia with Hbg 10.3 (from 11.1 in July 2023).  UA negative for infection.   Chest xray negative. Trauma imaging including CT's of head, C-spine and Right Hip revealed an acute right femoral neck fracture.   Patient was admitted to medicine service with orthopedic surgery consulted.  Further details of hospital course and management as outlined below.    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 Appreciate input and care.  GERD without esophagitis Protonix  Rheumatoid arthritis (HCC) Continue methotrexate        Subjective: Pt seen with son and nephew at bedside  after surgery today.  Pt reports no pain currently.  She wants to rest and asks her very talkative nephew to please "hush". Later asks them both to leave so she can rest.  No acute complaints.     Physical Exam: Vitals:   05/21/23 1100 05/21/23 1115 05/21/23 1130 05/21/23 1528  BP: (!) 85/36 (!) 94/40 (!) 94/40 (!) 118/50  Pulse: 67 62 63 92  Resp: 18 14 20 14   Temp: (!) 97.5 F (36.4 C)   98.3 F (36.8 C)  TempSrc:      SpO2: 97% 100% 99% 99%  Weight:      Height:       General exam: awake, alert, no acute distress HEENT: moist mucus membranes, hearing grossly normal  Respiratory system: CTAB no wheezes, rales or rhonchi, normal respiratory effort. Cardiovascular system: normal S1/S2, RRR, no JVD, murmurs, rubs, gallops, no pedal edema.   Gastrointestinal system: soft, NT, ND, no HSM felt, +bowel sounds. Central nervous system: A&O x 3. no gross focal neurologic deficits, normal speech Extremities: right hip dressing in place clean dry intact, no edema, normal tone Skin: dry, intact, normal temperature Psychiatry: normal mood, congruent affect, judgement and insight appear normal   Data Reviewed:  Notable labs ---  Glucose 119, Ca 8.6, Hbg 9.3  Family Communication: son and nephew at bedside today  Disposition: Status is: Inpatient Remains inpatient appropriate because: post-op hip fracture, awaiting PT/OT evaluations for dispo planning   Planned Discharge Destination: Skilled nursing facility vs Home health    Time spent: 42 minutes  Author: Pennie Banter, DO 05/21/2023 4:14 PM  For on call review [email or web address].

## 2023-05-21 NOTE — Transfer of Care (Signed)
Immediate Anesthesia Transfer of Care Note  Patient: NATACIA JUILLERAT  Procedure(s) Performed: ARTHROPLASTY BIPOLAR HIP (HEMIARTHROPLASTY) (Right: Hip)  Patient Location: PACU  Anesthesia Type:General  Level of Consciousness: awake, alert , and oriented  Airway & Oxygen Therapy: Patient Spontanous Breathing and Patient connected to face mask oxygen  Post-op Assessment: Report given to RN and Post -op Vital signs reviewed and stable  Post vital signs: Reviewed and stable  Last Vitals:  Vitals Value Taken Time  BP 120/48 05/21/23 1010  Temp    Pulse 71 05/21/23 1012  Resp 14 05/21/23 1013  SpO2 100 % 05/21/23 1012  Vitals shown include unfiled device data.  Last Pain:  Vitals:   05/21/23 0501  TempSrc: Oral  PainSc:          Complications: No notable events documented.

## 2023-05-21 NOTE — Consult Note (Signed)
ORTHOPAEDIC CONSULTATION  REQUESTING PHYSICIAN: Pennie Banter, DO  Chief Complaint:   Right hip pain.  History of Present Illness: Wanda Monroe is a 82 y.o. female with multiple medical problems including diabetes, hypertension, hyperlipidemia, gastroesophageal reflux disease, osteoporosis, gout and 2 strokes who normally lives independently and ambulates with a cane.  Apparently she lost her balance getting up from the commode and fell onto her right side at home last evening.  She was brought to the emergency room where x-rays and subsequent CT scanning confirmed the presence of a displaced right femoral neck fracture.  The patient denies any associated injuries.  She did not strike her head or lose consciousness.  She also denies any lightheadedness, dizziness, chest pain, shortness of breath, or other symptoms which may have precipitated her fall.  Past Medical History:  Diagnosis Date   Anemia    Arthritis    rhuematoid,OSTEOARTHRITIS   Cellulitis    Diabetes mellitus without complication (HCC)    type II   GERD (gastroesophageal reflux disease)    Gout    Hiatal hernia    Hyperlipidemia    Hypertension    Neuromuscular disorder (HCC)    left foot neuropathy   Osteoporosis    Rosacea    Spinal stenosis of lumbar region    Stroke [facility])    x2, 9/10   Temporal arteritis (HCC)    Wears dentures    full top, partial bottom   Past Surgical History:  Procedure Laterality Date   ABDOMINAL HYSTERECTOMY     APPENDECTOMY     BACK SURGERY     lumbar and cervical laminectomies   BREAST BIOPSY Left 1960,1980   twice negative results   CAPSULOTOMY METATARSOPHALANGEAL Left 01/22/2015   Procedure: CAPSULOTOMY METATARSOPHALANGEAL;  Surgeon: Recardo Evangelist, DPM;  Location: [facility];  Service: Podiatry;  Laterality: Left;   CATARACT EXTRACTION W/PHACO Left 10/19/2016   Procedure: CATARACT EXTRACTION PHACO AND  INTRAOCULAR LENS PLACEMENT (IOC)  Left Eye  Diabetic;  Surgeon: Lockie Mola, MD;  Location: [facility];  Service: Ophthalmology;  Laterality: Left;  Diabetic - oral meds Left Eye   CATARACT EXTRACTION W/PHACO Right 11/16/2016   Procedure: CATARACT EXTRACTION PHACO AND INTRAOCULAR LENS PLACEMENT (IOC) dIABETIC;  Surgeon: Lockie Mola, MD;  Location: [facility];  Service: Ophthalmology;  Laterality: Right;  DIABETES - oral meds   COLONOSCOPY WITH PROPOFOL N/A 04/24/2018   Procedure: COLONOSCOPY WITH PROPOFOL;  Surgeon: Toledo, Boykin Nearing, MD;  Location: [facility] ENDOSCOPY;  Service: Gastroenterology;  Laterality: N/A;   ESOPHAGOGASTRODUODENOSCOPY N/A 07/01/2021   Procedure: ESOPHAGOGASTRODUODENOSCOPY (EGD);  Surgeon: Sung Amabile, DO;  Location: [facility] ENDOSCOPY;  Service: General;  Laterality: N/A;   ESOPHAGOGASTRODUODENOSCOPY (EGD) WITH PROPOFOL N/A 04/24/2018   Procedure: ESOPHAGOGASTRODUODENOSCOPY (EGD) WITH PROPOFOL;  Surgeon: Toledo, Boykin Nearing, MD;  Location: [facility] ENDOSCOPY;  Service: Gastroenterology;  Laterality: N/A;   FOOT SURGERY     HALLUX VALGUS AKIN Left 01/22/2015   Procedure: HALLUX VALGUS AKIN;  Surgeon: Recardo Evangelist, DPM;  Location: [facility];  Service: Podiatry;  Laterality: Left;  left great toe   HEMORRHOID SURGERY     KNEE ARTHROSCOPY     LUMBAR LAMINECTOMY  04/23/13   NOSE SURGERY     TONSILLECTOMY     URETER SURGERY     Social History   Socioeconomic History   Marital status: Widowed    Spouse name: Not on file   Number of children: Not on file   Years of education: Not  on file   Highest education level: Not on file  Occupational History   Not on file  Tobacco Use   Smoking status: Never   Smokeless tobacco: Never  Vaping Use   Vaping status: Never Used  Substance and Sexual Activity   Alcohol use: No   Drug use: Not Currently   Sexual activity: Not on file  Other Topics Concern   Not on file  Social History Narrative    Not on file   Social Determinants of Health   Financial Resource Strain: Not on file  Food Insecurity: No Food Insecurity (05/21/2023)   Hunger Vital Sign    Worried About Running Out of Food in the Last Year: Never true    Ran Out of Food in the Last Year: Never true  Transportation Needs: No Transportation Needs (05/21/2023)   PRAPARE - Administrator, Civil Service (Medical): No    Lack of Transportation (Non-Medical): No  Physical Activity: Not on file  Stress: Not on file  Social Connections: Not on file   Family History  Problem Relation Age of Onset   Diabetes Father    Stroke Father    Breast cancer Neg Hx    Allergies  Allergen Reactions   Codeine Sulfate Nausea Only   Remicade [Infliximab] Other (See Comments)    Infection   Prior to Admission medications   Medication Sig Start Date End Date Taking? Authorizing Provider  acetaminophen (TYLENOL) 650 MG CR tablet Take 650 mg by mouth every 8 (eight) hours as needed for pain.    [provider]  aspirin 81 MG tablet Take 81 mg by mouth daily. AM    [provider]  atorvastatin (LIPITOR) 80 MG tablet Take 80 mg by mouth daily. PM    [provider]  calcium citrate-vitamin D (CITRACAL+D) 315-200 MG-UNIT per tablet Take 1 tablet by mouth daily.    [provider]  cholecalciferol (CHOLECALCIFEROL) 25 MCG tablet Take 1 tablet (1,000 Units total) by mouth daily. 04/13/22   Enedina Finner, MD  DULoxetine (CYMBALTA) 30 MG capsule Take 30 mg by mouth daily. 07/07/21   [provider]  folic acid (FOLVITE) 1 MG tablet Take 1 mg by mouth daily.    [provider]  gabapentin (NEURONTIN) 100 MG capsule Take 200 mg by mouth 2 (two) times daily.    [provider]  hydrochlorothiazide (HYDRODIURIL) 25 MG tablet Take 25 mg by mouth daily.    [provider]  latanoprost (XALATAN) 0.005 % ophthalmic solution Place 1 drop into both eyes at bedtime.    [provider]  metFORMIN (GLUCOPHAGE) 500 MG tablet Take 500 mg by mouth 2 (two) times daily with a meal.    [provider]  methotrexate (RHEUMATREX) 2.5 MG tablet Take 12.5 mg by mouth every Thursday.    [provider]  mirtazapine (REMERON) 15 MG tablet Take 15 mg by mouth at bedtime. 08/09/21   [provider]  pantoprazole (PROTONIX) 40 MG tablet Take 1 tablet (40 mg total) by mouth daily. 04/12/22   Enedina Finner, MD  Potassium Chloride ER 20 MEQ TBCR Take 1 tablet by mouth 2 (two) times daily. 02/19/22   [provider]  sertraline (ZOLOFT) 50 MG tablet Take 50 mg by mouth at bedtime.    [provider]  spironolactone (ALDACTONE) 25 MG tablet Take 25 mg by mouth daily.    [provider]  timolol (TIMOPTIC) 0.5 % ophthalmic solution  Place 1 drop into both eyes 2 (two) times daily.    [provider]  tiZANidine (ZANAFLEX) 4 MG tablet Take 4 mg by mouth 3 (three) times daily as needed for muscle spasms. 06/22/21   [provider]  traMADol (ULTRAM) 50 MG tablet Take 50 mg by mouth daily as needed for moderate pain. 07/27/21   [provider]  vitamin B-12 (CYANOCOBALAMIN) 1000 MCG tablet Take 1,000 mcg by mouth daily.    [provider]   CT HEAD WO CONTRAST ( )  Result Date: 05/21/2023 CLINICAL DATA:  Larey Seat from standing, head and neck trauma. EXAM: CT HEAD WITHOUT CONTRAST CT CERVICAL SPINE WITHOUT CONTRAST TECHNIQUE: Multidetector CT imaging of the head and cervical spine was performed following the standard protocol without intravenous contrast. Multiplanar CT image reconstructions of the cervical spine were also generated. RADIATION DOSE REDUCTION: This exam was performed according to the departmental dose-optimization program which includes automated exposure control, adjustment of the mA and/or kV according to patient size and/or use of iterative reconstruction technique. COMPARISON:  MRI brain  08/29/2009, with no prior cross-sectional imaging or films of the cervical spine. Limited comparison available with CTA chest 04/11/2022. FINDINGS: CT HEAD FINDINGS Brain: There is chronic right posterior parieto-occipital encephalomalacia corresponding to the distribution of the previously acute infarct. There is additional wedge-shaped chronic infarct which was also previously acute, in the posteroinferior right cerebellar hemisphere and additional small scattered chronic bilateral cerebellar lacunar infarcts. There is mild cerebellar atrophy also increased as well as progressive moderate cerebral atrophy, small-vessel disease and atrophic ventriculomegaly. No asymmetry is seen worrisome for an acute cortical based infarct, hemorrhage, mass or mass effect and there is no midline shift. Basal cisterns are clear. Vascular: Moderate patchy calcific plaque both siphons. No hyperdense central vessels. Skull: Negative for fractures or focal lesions. There is calvarial osteopenia. There is no visible scalp hematoma. Sinuses/Orbits: Negative orbits except for interval bilateral lens replacements. Clear sinuses and mastoids. Midline nasal septum. Other: None. CT CERVICAL SPINE FINDINGS Alignment: There is a trace retrolisthesis C3-4, C4-5 and C5-6, believed to be discogenic in etiology. No traumatic listhesis is suspected or other misalignment. Narrowing and spurring of the anterior atlantodental joint is noted, with the C1 lateral masses normally positioned on C2. Skull base and vertebrae: There is osteopenia. No acute fracture is evident. No primary bone lesion or focal pathologic process. There is mild anterior wedging of the C7, T1, and T2 vertebral bodies, but this was seen on the prior CTA chest unchanged. The other cervical vertebrae are normal in Incidentally noted is bone-on-bone degenerative arthrosis and mild remodeling of both TMJs. The patient is edentulous except for the low anteriors. Soft tissues and spinal  canal: No prevertebral fluid or swelling. No visible canal hematoma. There are moderately heavy calcifications of the carotid bifurcations. No laryngeal mass. There is a 9 mm nodule in the right lobe of the thyroid gland. No follow-up imaging is recommended. Disc levels: There is mild disc space loss at C3-4 at C4-5. The other cervical discs are normal in heights. No herniated disc or cord compromise are seen. There is mild cervical spondylosis but no significant posterior disc osteophyte complex. Mild features of facet arthrosis are present but do not result in bony foraminal compromise. Upper chest: Negative. Other: None. IMPRESSION: 1. No acute intracranial CT findings or depressed skull fractures. 2. Chronic right posterior parieto-occipital encephalomalacia, and additional chronic infarct in the dorsal inferior right cerebellar hemisphere with bilateral cerebellar lacunae. 3. Progressive atrophy,  small-vessel disease, and atrophic ventriculomegaly since 2010. 4. Osteopenia and degenerative change without evidence of cervical fractures or traumatic listhesis. 5. Mild chronic wedging of the C7, T1, and T2 vertebral bodies. 6. Carotid atherosclerosis. 7. 9 mm right thyroid nodule.  No follow-up imaging is recommended. Electronically Signed   By: Almira Bar M.D.   On: 05/21/2023 00:07   CT Cervical Spine Wo Contrast  Result Date: 05/21/2023 CLINICAL DATA:  Larey Seat from standing, head and neck trauma. EXAM: CT HEAD WITHOUT CONTRAST CT CERVICAL SPINE WITHOUT CONTRAST TECHNIQUE: Multidetector CT imaging of the head and cervical spine was performed following the standard protocol without intravenous contrast. Multiplanar CT image reconstructions of the cervical spine were also generated. RADIATION DOSE REDUCTION: This exam was performed according to the departmental dose-optimization program which includes automated exposure control, adjustment of the mA and/or kV according to patient size and/or use of iterative  reconstruction technique. COMPARISON:  MRI brain 08/29/2009, with no prior cross-sectional imaging or films of the cervical spine. Limited comparison available with CTA chest 04/11/2022. FINDINGS: CT HEAD FINDINGS Brain: There is chronic right posterior parieto-occipital encephalomalacia corresponding to the distribution of the previously acute infarct. There is additional wedge-shaped chronic infarct which was also previously acute, in the posteroinferior right cerebellar hemisphere and additional small scattered chronic bilateral cerebellar lacunar infarcts. There is mild cerebellar atrophy also increased as well as progressive moderate cerebral atrophy, small-vessel disease and atrophic ventriculomegaly. No asymmetry is seen worrisome for an acute cortical based infarct, hemorrhage, mass or mass effect and there is no midline shift. Basal cisterns are clear. Vascular: Moderate patchy calcific plaque both siphons. No hyperdense central vessels. Skull: Negative for fractures or focal lesions. There is calvarial osteopenia. There is no visible scalp hematoma. Sinuses/Orbits: Negative orbits except for interval bilateral lens replacements. Clear sinuses and mastoids. Midline nasal septum. Other: None. CT CERVICAL SPINE FINDINGS Alignment: There is a trace retrolisthesis C3-4, C4-5 and C5-6, believed to be discogenic in etiology. No traumatic listhesis is suspected or other misalignment. Narrowing and spurring of the anterior atlantodental joint is noted, with the C1 lateral masses normally positioned on C2. Skull base and vertebrae: There is osteopenia. No acute fracture is evident. No primary bone lesion or focal pathologic process. There is mild anterior wedging of the C7, T1, and T2 vertebral bodies, but this was seen on the prior CTA chest unchanged. The other cervical vertebrae are normal in Incidentally noted is bone-on-bone degenerative arthrosis and mild remodeling of both TMJs. The patient is edentulous  except for the low anteriors. Soft tissues and spinal canal: No prevertebral fluid or swelling. No visible canal hematoma. There are moderately heavy calcifications of the carotid bifurcations. No laryngeal mass. There is a 9 mm nodule in the right lobe of the thyroid gland. No follow-up imaging is recommended. Disc levels: There is mild disc space loss at C3-4 at C4-5. The other cervical discs are normal in heights. No herniated disc or cord compromise are seen. There is mild cervical spondylosis but no significant posterior disc osteophyte complex. Mild features of facet arthrosis are present but do not result in bony foraminal compromise. Upper chest: Negative. Other: None. IMPRESSION: 1. No acute intracranial CT findings or depressed skull fractures. 2. Chronic right posterior parieto-occipital encephalomalacia, and additional chronic infarct in the dorsal inferior right cerebellar hemisphere with bilateral cerebellar lacunae. 3. Progressive atrophy, small-vessel disease, and atrophic ventriculomegaly since 2010. 4. Osteopenia and degenerative change without evidence of cervical fractures or traumatic listhesis. 5. Mild chronic  wedging of the C7, T1, and T2 vertebral bodies. 6. Carotid atherosclerosis. 7. 9 mm right thyroid nodule.  No follow-up imaging is recommended. Electronically Signed   By: Almira Bar M.D.   On: 05/21/2023 00:07   CT Hip Right Wo Contrast  Result Date: 05/20/2023 CLINICAL DATA:  Trauma EXAM: CT OF THE RIGHT HIP WITHOUT CONTRAST TECHNIQUE: Multidetector CT imaging of the right hip was performed according to the standard protocol. Multiplanar CT image reconstructions were also generated. RADIATION DOSE REDUCTION: This exam was performed according to the departmental dose-optimization program which includes automated exposure control, adjustment of the mA and/or kV according to patient size and/or use of iterative reconstruction technique. COMPARISON:  Right hip x-ray same day  FINDINGS: Bones/Joint/Cartilage There is an acute right femoral neck fracture. There is mild apex anterior angulation there is 1/2 shaft with posterior displacement of the distal fracture fragment. There is mild impaction. There are moderate degenerative changes of the right hip with joint space narrowing and osteophyte formation. Ligaments Suboptimally assessed by CT. Muscles and Tendons No focal hematoma or fluid collection. Soft tissues Peripheral vascular calcifications are present. There is a Foley catheter in the bladder. There is sigmoid colon diverticulosis. IMPRESSION: Acute right femoral neck fracture. Electronically Signed   By: Darliss Cheney M.D.   On: 05/20/2023 23:48   DG Knee 2 Views Right  Result Date: 05/20/2023 CLINICAL DATA:  Fall EXAM: RIGHT KNEE - 1-2 VIEW COMPARISON:  None Available. FINDINGS: No fracture or malalignment. Vascular calcifications. 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 Trace knee effusion. IMPRESSION: 1. No acute osseous abnormality. 2. 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 3. Trace knee effusion. Electronically Signed   By: Jasmine Pang M.D.   On: 05/20/2023 22:35   DG Chest Portable 1 View  Result Date: 05/20/2023 CLINICAL DATA:  Fall EXAM: PORTABLE CHEST 1 VIEW COMPARISON:  CT chest 04/11/2022 FINDINGS: No acute airspace disease or effusion. Normal cardiomediastinal silhouette with aortic atherosclerosis. No pneumothorax. IMPRESSION: No active disease. Electronically Signed   By: Jasmine Pang M.D.   On: 05/20/2023 22:35   DG HIP UNILAT WITH PELVIS 2-3 VIEWS RIGHT  Result Date: 05/20/2023 CLINICAL DATA:  Fall with right-sided hip pain EXAM: DG HIP (WITH OR WITHOUT PELVIS) 2-3V RIGHT COMPARISON:  01/24/2015 FINDINGS: Right femoral head projects in joint. Pubic symphysis and rami appear intact. Findings suspicious for right femoral neck fracture. Advanced vascular  calcifications IMPRESSION: Findings suspicious for right femoral neck fracture. Electronically Signed   By: Jasmine Pang M.D.   On: 05/20/2023 22:34    Positive ROS: All other systems have been reviewed and were otherwise negative with the exception of those mentioned in the HPI and as above.  Physical Exam: General:  Alert, no acute distress Psychiatric:  Patient is competent for consent with normal mood and affect   Cardiovascular:  No pedal edema Respiratory:  No wheezing, non-labored breathing GI:  Abdomen is soft and non-tender Skin:  No lesions in the area of chief complaint Neurologic:  Sensation intact distally Lymphatic:  No axillary or cervical lymphadenopathy  Orthopedic Exam:  Orthopedic examination is limited to the right hip and lower extremity.  The right lower extremity is somewhat shortened and externally rotated as compared to the left.  Skin inspection around the right hip is unremarkable.  No swelling, erythema, ecchymosis, abrasions, or other skin abnormalities are identified.  She has moderate tenderness palpation over the anterolateral  aspect of the right hip.  She has more severe pain with any attempted active or passive motion of the hip.  She is grossly neurovascularly intact to the right lower extremity and foot.  X-rays:  Recent x-rays of the pelvis and right hip, as well as a CT scan of the pelvis are available for review.  The findings were as described above.  These films depict a displaced right femoral neck fracture.  No lytic lesions or other acute bony abnormalities are identified.  No significant degenerative changes of the hip joint are noted.  Assessment: Displaced right femoral neck fracture.  Plan: The treatment options, including both surgical and nonsurgical choices, have been discussed in detail with the patient and her family.  The patient would like to proceed with a right hip hemiarthroplasty.  The risks (including bleeding, infection, nerve  and/or blood vessel injury, persistent or recurrent pain, loosening or failure of the components, leg length inequality, dislocation, need for further surgery, blood clots, strokes, heart attacks or arrhythmias, pneumonia, etc.) and benefits of the surgical procedure were discussed.  The patient states her understanding and agrees to proceed.  A formal written consent will be obtained by the nursing staff.  Thank you for asking me to participate in the care of this most pleasant and unfortunate woman.  I will be happy to follow her with you.   Maryagnes Amos, MD  Beeper #:  [phone]  05/21/2023 6:18 AM

## 2023-05-21 NOTE — Hospital Course (Signed)
HPI on admission 05/20/23 PM: "Wanda Monroe is a 82 y.o. Caucasian female with medical history significant for type diabetes mellitus, GERD, hypertension, gout, dyslipidemia, osteoporosis, TIA and temporal arthritis who presented to the emergency room with acute onset of accidental mechanical fall with subsequent right hip pain.  She was having a bowel movement and was getting off of the commode when she lost her balance.  No head injuries.  She denies any presyncope or syncope. ..."  ED course - BP 186/81, RR 21 otherwise normal vitals.  BMP was unremarkable.  CBC showed chronic anemia with Hbg 10.3 (from 11.1 in July 2023).  UA negative for infection.   Chest xray negative. Trauma imaging including CT's of head, C-spine and Right Hip revealed an acute right femoral neck fracture.   Patient was admitted to medicine service with orthopedic surgery consulted.  Further details of hospital course and management as outlined below.

## 2023-05-22 ENCOUNTER — Encounter: Payer: Self-pay | Admitting: Surgery

## 2023-05-22 DIAGNOSIS — D62 Acute posthemorrhagic anemia: Secondary | ICD-10-CM | POA: Diagnosis not present

## 2023-05-22 DIAGNOSIS — Z515 Encounter for palliative care: Secondary | ICD-10-CM | POA: Diagnosis not present

## 2023-05-22 DIAGNOSIS — E1142 Type 2 diabetes mellitus with diabetic polyneuropathy: Secondary | ICD-10-CM | POA: Diagnosis not present

## 2023-05-22 LAB — CBC
HCT: 28.3 % — ABNORMAL LOW (ref 36.0–46.0)
Hemoglobin: 8.7 g/dL — ABNORMAL LOW (ref 12.0–15.0)
MCH: 26.2 pg (ref 26.0–34.0)
MCHC: 30.7 g/dL (ref 30.0–36.0)
MCV: 85.2 fL (ref 80.0–100.0)
Platelets: 274 10*3/uL (ref 150–400)
RBC: 3.32 MIL/uL — ABNORMAL LOW (ref 3.87–5.11)
RDW: 15.6 % — ABNORMAL HIGH (ref 11.5–15.5)
WBC: 7.4 10*3/uL (ref 4.0–10.5)
nRBC: 0 % (ref 0.0–0.2)

## 2023-05-22 LAB — MAGNESIUM: Magnesium: 1.7 mg/dL (ref 1.7–2.4)

## 2023-05-22 LAB — BASIC METABOLIC PANEL
Anion gap: 8 (ref 5–15)
BUN: 17 mg/dL (ref 8–23)
CO2: 25 mmol/L (ref 22–32)
Calcium: 8.4 mg/dL — ABNORMAL LOW (ref 8.9–10.3)
Chloride: 106 mmol/L (ref 98–111)
Creatinine, Ser: 0.75 mg/dL (ref 0.44–1.00)
GFR, Estimated: >60 mL/min (ref >60)
Glucose, Bld: 167 mg/dL — ABNORMAL HIGH (ref 70–99)
Potassium: 4.2 mmol/L (ref 3.5–5.1)
Sodium: 139 mmol/L (ref 135–145)

## 2023-05-22 MED ORDER — ENSURE ENLIVE PO LIQD
237.0000 mL | Freq: Two times a day (BID) | ORAL | Status: DC
  Administered 2023-05-23 – 2023-05-25 (×2): 237 mL via ORAL

## 2023-05-22 MED ORDER — ADULT MULTIVITAMIN W/MINERALS CH
1.0000 | ORAL_TABLET | Freq: Every day | ORAL | Status: DC
  Administered 2023-05-22 – 2023-05-25 (×4): 1 via ORAL
  Filled 2023-05-22 (×4): qty 1

## 2023-05-22 NOTE — Assessment & Plan Note (Signed)
Iron deficiency anemia Hbg trend 10.3 >> 9.3 >> 8.7>> 8.7 Iron studies show deficiency --Consider iron infusion if deficient --Monitor CBC --Start iron supplement

## 2023-05-22 NOTE — Anesthesia Postprocedure Evaluation (Signed)
Anesthesia Post Note  Patient: Wanda Monroe  Procedure(s) Performed: ARTHROPLASTY BIPOLAR HIP (HEMIARTHROPLASTY) (Right: Hip)  Patient location during evaluation: Nursing Unit Anesthesia Type: Spinal Level of consciousness: oriented and awake and alert Pain management: pain level controlled Vital Signs Assessment: post-procedure vital signs reviewed and stable Respiratory status: spontaneous breathing and respiratory function stable Cardiovascular status: blood pressure returned to baseline and stable Postop Assessment: no headache, no backache, no apparent nausea or vomiting and patient able to bend at knees Anesthetic complications: no  No notable events documented.   Last Vitals:  Vitals:   05/21/23 2348 05/22/23 0352  BP: (!) 157/58 (!) 145/60  Pulse: 87 80  Resp: 17 17  Temp: 36.7 C 36.7 C  SpO2: 94% 96%    Last Pain:  Vitals:   05/22/23 0622  TempSrc:   PainSc: Asleep                 Sara Keys B Alonza Smoker

## 2023-05-22 NOTE — Progress Notes (Signed)
Physical Therapy Treatment Patient Details Name: Wanda Monroe MRN: [id number] DOB: [birth year] Today's Date: 05/22/2023   History of Present Illness Pt is an 82 y.o. female presenting to hospital 05/20/23 s/p fall at home.  Imaging showing R femoral neck fx.  Pt also noted with hypertensive urgency.  Pt s/p R hip unipolar hemiarthroplasty 05/21/23 d/t R hip displaced femoral neck fx.  Per notes pt is a hospice pt (for terminal diagnosis of respiratory failure).  PMH includes DM, htn, gout, TIA, temporal arthritis, anemia, L foot neuropathy, back sx.    PT Comments  Pt sitting on BSC upon PT arrival; pt finished toileting and agreeable to therapy.  During session pt CGA with transfers and ambulation 30 feet with RW use.  Mild R hip pain noted during session.  Reviewed posterior hip precautions with pt (pt unable to state any precaution and requiring cueing during session to maintain precautions; pt issued written HEP handout with precautions).  Will continue to focus on strengthening and progressive functional mobility during hospitalization.    If plan is discharge home, recommend the following: A little help with walking and/or transfers;A little help with bathing/dressing/bathroom;Assistance with cooking/housework;Direct supervision/assist for medications management;Direct supervision/assist for financial management;Assist for transportation;Help with stairs or ramp for entrance;Supervision due to cognitive status   Can travel by private vehicle        Equipment Recommendations  Rolling walker (2 wheels);BSC/3in1    Recommendations for Other Services       Precautions / Restrictions Precautions Precautions: Posterior Hip;Fall Precaution Booklet Issued: Yes (comment) Restrictions Weight Bearing Restrictions: Yes RLE Weight Bearing: Weight bearing as tolerated     Mobility  Bed Mobility Overal bed mobility: Needs Assistance Bed Mobility: Sit to Supine     Sit to supine: Min assist, HOB  elevated   General bed mobility comments: assist for R LE    Transfers Overall transfer level: Needs assistance Equipment used: Rolling walker (2 wheels) Transfers: Sit to/from Stand Sit to Stand: Contact guard assist           General transfer comment: vc's for UE/LE placement and posterior hip precautions    Ambulation/Gait Ambulation/Gait assistance: Contact guard assist Gait Distance (Feet): 30 Feet Assistive device: Rolling walker (2 wheels)   Gait velocity: decreased     General Gait Details: antalgic; decreased stance time R LE; vc's for walker use   Stairs             Wheelchair Mobility     Tilt Bed    Modified Rankin (Stroke Patients Only)       Balance Overall balance assessment: Needs assistance Sitting-balance support: No upper extremity supported, Feet supported Sitting balance-Leahy Scale: Good Sitting balance - Comments: steady reaching within BOS   Standing balance support: Bilateral upper extremity supported, Reliant on assistive device for balance Standing balance-Leahy Scale: Fair Standing balance comment: steady static standing with B UE support on RW                            Cognition Arousal: Alert Behavior During Therapy: WFL for tasks assessed/performed Overall Cognitive Status: No family/caregiver present to determine baseline cognitive functioning                                 General Comments: Oriented to person, place, month/year, and situation; minimal confusion noted this session  Exercises Total Joint Exercises Long Arc Quad: AROM, Strengthening, Both, 10 reps, Seated    General Comments        Pertinent Vitals/Pain Pain Assessment Pain Assessment: Faces Faces Pain Scale: Hurts a little bit Pain Location: R hip Pain Descriptors / Indicators: Discomfort, Sore Pain Intervention(s): Limited activity within patient's tolerance, Monitored during session, Repositioned Vitals  (HR and SpO2 on room air) stable and WFL throughout treatment session.    Home Living Family/patient expects to be discharged to:: Private residence Living Arrangements: Alone Available Help at Discharge: Friend(s);Available PRN/intermittently Type of Home: House Home Access: Ramped entrance       Home Layout: One level Home Equipment: Cane - quad      Prior Function            PT Goals (current goals can now be found in the care plan section) Acute Rehab PT Goals Patient Stated Goal: to improve mobility PT Goal Formulation: With patient Time For Goal Achievement: 06/05/23 Potential to Achieve Goals: Good Progress towards PT goals: Progressing toward goals    Frequency    7X/week      PT Plan      Co-evaluation              AM-PAC PT "6 Clicks" Mobility   Outcome Measure  Help needed turning from your back to your side while in a flat bed without using bedrails?: A Little Help needed moving from lying on your back to sitting on the side of a flat bed without using bedrails?: A Little Help needed moving to and from a bed to a chair (including a wheelchair)?: A Little Help needed standing up from a chair using your arms (e.g., wheelchair or bedside chair)?: A Little Help needed to walk in hospital room?: A Little Help needed climbing 3-5 steps with a railing? : A Little 6 Click Score: 18    End of Session Equipment Utilized During Treatment: Gait belt Activity Tolerance: Patient tolerated treatment well Patient left: in bed;with call bell/phone within reach;with bed alarm set;with SCD's reapplied;Other (comment) (pillow between pt's knees for posterior hip precautions) Nurse Communication: Mobility status;Precautions (via white board) PT Visit Diagnosis: Unsteadiness on feet (R26.81);Muscle weakness (generalized) (M62.81);History of falling (Z91.81);Other abnormalities of gait and mobility (R26.89);Pain Pain - Right/Left: Right Pain - part of body: Hip      Time: 0272-5366 PT Time Calculation (min) (ACUTE ONLY): 27 min  Charges:    $Therapeutic Activity: 23-37 mins PT General Charges $$ ACUTE PT VISIT: 1 Visit                     Hendricks Limes, PT 05/22/23, 4:32 PM

## 2023-05-22 NOTE — TOC Progression Note (Signed)
Transition of Care [facility]) - Progression Note    Patient Details  Name: Wanda Monroe MRN: [id number] Date of Birth: 1941-07-27  Transition of Care [facility]) CM/SW Contact  Marlowe Sax, RN Phone Number: 05/22/2023, 4:43 PM  Clinical Narrative:    Spoke with the patient She does not want to go to STR She is a Current [facility] patient She said that a person comes Mon Tue Wed and Friday, her nephew stays with her at night,   She has Dme at home including a Education administrator and [facility]  I asked the patient about who will help her at home during the day, she stated that she does not have ongoing help during the day   Will attempt to talk to her again tomorrow about going to [facility]  Expected Discharge Plan and Services                                               Social Determinants of Health (SDOH) Interventions SDOH Screenings   Food Insecurity: No Food Insecurity (05/21/2023)  Housing: Low Risk  (05/21/2023)  Transportation Needs: No Transportation Needs (05/21/2023)  Utilities: Not At Risk (05/21/2023)  Tobacco Use: Low Risk  (05/20/2023)    Readmission Risk Interventions     No data to display

## 2023-05-22 NOTE — Plan of Care (Signed)
  Problem: Education: Goal: Knowledge of General Education information will improve Description: Including pain rating scale, medication(s)/side effects and non-pharmacologic comfort measures Outcome: Progressing   Problem: Clinical Measurements: Goal: Ability to maintain clinical measurements within normal limits will improve Outcome: Progressing Goal: Will remain free from infection Outcome: Progressing   Problem: Activity: Goal: Risk for activity intolerance will decrease Outcome: Progressing   Problem: Elimination: Goal: Will not experience complications related to bowel motility Outcome: Progressing Goal: Will not experience complications related to urinary retention Outcome: Progressing   Problem: Safety: Goal: Ability to remain free from injury will improve Outcome: Progressing

## 2023-05-22 NOTE — Evaluation (Signed)
Physical Therapy Evaluation Patient Details Name: Wanda Monroe MRN: [id number] DOB: 02-22-1941 Today's Date: 05/22/2023  History of Present Illness  Pt is an 82 y.o. female presenting to hospital 05/20/23 s/p fall at home.  Imaging showing R femoral neck fx.  Pt also noted with hypertensive urgency.  Pt s/p R hip unipolar hemiarthroplasty 05/21/23 d/t R hip displaced femoral neck fx.  Per notes pt is a hospice pt (for terminal diagnosis of respiratory failure).  PMH includes DM, htn, gout, TIA, temporal arthritis, anemia, L foot neuropathy, back sx.  Clinical Impression  Prior to hospital admission, (per chart and pt) pt was ambulatory with cane.  Pt oriented to person and reason she fell; pt reported she was told she had surgery; pt reported it was September 2002; general confusion noted during session.  Currently pt is mod assist semi-supine to sitting EOB; min assist with transfers; and min assist to ambulate a few feet bed to recliner with RW use.  Minimal R hip pain reported during activities.  Pt would currently benefit from skilled PT to address noted impairments and functional limitations (see below for any additional details).  Upon hospital discharge, pt would benefit from ongoing therapy.     If plan is discharge home, recommend the following: A little help with walking and/or transfers;A little help with bathing/dressing/bathroom;Assistance with cooking/housework;Direct supervision/assist for medications management;Direct supervision/assist for financial management;Assist for transportation;Help with stairs or ramp for entrance;Supervision due to cognitive status   Can travel by private vehicle        Equipment Recommendations Rolling walker (2 wheels);BSC/3in1  Recommendations for Other Services       Functional Status Assessment Patient has had a recent decline in their functional status and demonstrates the ability to make significant improvements in function in a reasonable and  predictable amount of time.     Precautions / Restrictions Precautions Precautions: Posterior Hip;Fall Restrictions Weight Bearing Restrictions: Yes RLE Weight Bearing: Weight bearing as tolerated      Mobility  Bed Mobility Overal bed mobility: Needs Assistance Bed Mobility: Supine to Sit     Supine to sit: Mod assist     General bed mobility comments: assist for trunk and R LE    Transfers Overall transfer level: Needs assistance Equipment used: Rolling walker (2 wheels) Transfers: Sit to/from Stand Sit to Stand: Min assist           General transfer comment: vc's for UE/LE placement; assist to initiate stand and control descent sitting    Ambulation/Gait Ambulation/Gait assistance: Min assist Gait Distance (Feet): 3 Feet (bed to recliner) Assistive device: Rolling walker (2 wheels)   Gait velocity: decreased     General Gait Details: antalgic; decreased stance time R LE; vc's for walker use  Stairs            Wheelchair Mobility     Tilt Bed    Modified Rankin (Stroke Patients Only)       Balance Overall balance assessment: Needs assistance Sitting-balance support: No upper extremity supported, Feet supported Sitting balance-Leahy Scale: Good Sitting balance - Comments: steady reaching within BOS   Standing balance support: Bilateral upper extremity supported, Reliant on assistive device for balance Standing balance-Leahy Scale: Fair Standing balance comment: B UE support on RW                             Pertinent Vitals/Pain Pain Assessment Pain Assessment: Faces Faces Pain Scale: Hurts a  little bit Pain Location: R hip Pain Descriptors / Indicators: Discomfort, Sore Pain Intervention(s): Limited activity within patient's tolerance, Monitored during session, Premedicated before session, Repositioned Vitals (HR and SpO2 on room air) stable and WFL throughout treatment session.    Home Living Family/patient expects to  be discharged to:: Private residence Living Arrangements: Alone Available Help at Discharge: Friend(s);Available PRN/intermittently Type of Home: House Home Access: Ramped entrance       Home Layout: One level Home Equipment: Cane - quad      Prior Function               Mobility Comments: Per chart and pt, pt is ambulatory with cane       Extremity/Trunk Assessment   Upper Extremity Assessment Upper Extremity Assessment: Overall WFL for tasks assessed    Lower Extremity Assessment Lower Extremity Assessment: Generalized weakness;RLE deficits/detail RLE Deficits / Details: hip flexion at least 2+/5; knee flexion/extension and DF at least 3/5 AROM RLE: Unable to fully assess due to pain       Communication   Communication Communication: Difficulty following commands/understanding Following commands: Follows one step commands inconsistently Cueing Techniques: Verbal cues;Visual cues  Cognition Arousal: Alert Behavior During Therapy: WFL for tasks assessed/performed Overall Cognitive Status: No family/caregiver present to determine baseline cognitive functioning                                 General Comments: Oriented to person and reason she fell; pt reported she was told she had surgery; pt reported it was September 2002; general confusion noted during session.        General Comments  Nursing cleared pt for participation in physical therapy.  Pt agreeable to PT session.    Exercises Total Joint Exercises Ankle Circles/Pumps: AROM, Strengthening, Both, 10 reps, Supine Short Arc Quad: AROM, Strengthening, Both, 10 reps, Supine Heel Slides: AAROM, Strengthening, Right, 10 reps, Supine Hip ABduction/ADduction: AAROM, Strengthening, Right, 10 reps, Supine   Assessment/Plan    PT Assessment Patient needs continued PT services  PT Problem List Decreased strength;Decreased activity tolerance;Decreased balance;Decreased mobility;Decreased  knowledge of use of DME;Decreased knowledge of precautions;Decreased skin integrity;Pain       PT Treatment Interventions DME instruction;Gait training;Functional mobility training;Therapeutic activities;Therapeutic exercise;Balance training;Patient/family education    PT Goals (Current goals can be found in the Care Plan section)  Acute Rehab PT Goals Patient Stated Goal: to improve mobility PT Goal Formulation: With patient Time For Goal Achievement: 06/05/23 Potential to Achieve Goals: Good    Frequency 7X/week     Co-evaluation               AM-PAC PT "6 Clicks" Mobility  Outcome Measure Help needed turning from your back to your side while in a flat bed without using bedrails?: A Little Help needed moving from lying on your back to sitting on the side of a flat bed without using bedrails?: A Lot Help needed moving to and from a bed to a chair (including a wheelchair)?: A Little Help needed standing up from a chair using your arms (e.g., wheelchair or bedside chair)?: A Little Help needed to walk in hospital room?: A Little Help needed climbing 3-5 steps with a railing? : A Lot 6 Click Score: 16    End of Session Equipment Utilized During Treatment: Gait belt Activity Tolerance: Patient tolerated treatment well Patient left: in chair;with call bell/phone within reach;with chair alarm set;with SCD's  reapplied Nurse Communication: Mobility status;Precautions (via white board) PT Visit Diagnosis: Unsteadiness on feet (R26.81);Muscle weakness (generalized) (M62.81);History of falling (Z91.81);Other abnormalities of gait and mobility (R26.89);Pain Pain - Right/Left: Right Pain - part of body: Hip    Time: 4259-5638 PT Time Calculation (min) (ACUTE ONLY): 34 min   Charges:   PT Evaluation $PT Eval Low Complexity: 1 Low PT Treatments $Therapeutic Exercise: 8-22 mins PT General Charges $$ ACUTE PT VISIT: 1 Visit        Hendricks Limes, PT 05/22/23, 1:09 PM

## 2023-05-22 NOTE — Progress Notes (Signed)
Subjective: 1 Day Post-Op Procedure(s) (LRB): ARTHROPLASTY BIPOLAR HIP (HEMIARTHROPLASTY) (Right) Patient reports pain as mild in the right hip this morning.   Patient is  well but is confused this AM.  She is self aware enough to ask where she is but she does not remember why she is in the hospital. PT and care management to assist with discharge planning.  She was on hospice prior to recent admission Negative for chest pain and shortness of breath Fever: no Gastrointestinal:Negative for nausea and vomiting  Objective: Vital signs in last 24 hours: Temp:  [97.5 F (36.4 C)-98.3 F (36.8 C)] 98 F (36.7 C) (09/09 0352) Pulse Rate:  [62-92] 80 (09/09 0352) Resp:  [14-21] 17 (09/09 0352) BP: (85-157)/(36-60) 145/60 (09/09 0352) SpO2:  [94 %-100 %] 96 % (09/09 0352)  Intake/Output from previous day:  Intake/Output Summary (Last 24 hours) at 05/22/2023 0730 Last data filed at 05/21/2023 1658 Gross per 24 hour  Intake 1501.17 ml  Output 50 ml  Net 1451.17 ml    Intake/Output this shift: No intake/output data recorded.  Labs: Recent Labs    05/20/23 2137 05/21/23 0526 05/22/23 0509  HGB 10.3* 9.3* 8.7*   Recent Labs    05/21/23 0526 05/22/23 0509  WBC 5.4 7.4  RBC 3.64* 3.32*  HCT 30.8* 28.3*  PLT 310 274   Recent Labs    05/21/23 0526 05/22/23 0509  NA 139 139  K 3.7 4.2  CL 103 106  CO2 28 25  BUN 18 17  CREATININE 0.84 0.75  GLUCOSE 119* 167*  CALCIUM 8.6* 8.4*   No results for input(s): "LABPT", "INR" in the last 72 hours.   EXAM General - Patient is Alert and Confused Extremity - ABD soft Neurovascular intact Dorsiflexion/Plantar flexion intact Incision: dressing C/D/I No cellulitis present Compartment soft Dressing/Incision - Honeycomb dressing to the right hip intact without any drainage noted. Motor Function - intact, moving foot and toes well on exam.  Abdomen soft with intact bowel sounds this AM.  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   Weight as of this encounter: 60.7 kg. Advance diet Up with therapy D/C IV fluids when tolerating po intake.  Labs and vitals reviewed this AM.  WBC 7.4 this morning. Hg 8.7 this AM. Up with therapy today, she was on hospice prior to admission per the patient.  Confused this AM, she remembers  surgery but thought it was a few days ago, asks where she is this AM.  Attempt to limit narcotic medication this AM, received hydrocodone this AM. Continue to work on BM.  DVT Prophylaxis - Lovenox and TED hose Weight-Bearing as tolerated to right leg  J. Horris Latino, PA-C [facility] 05/22/2023, 7:30 AM

## 2023-05-22 NOTE — Progress Notes (Signed)
Initial Nutrition Assessment  DOCUMENTATION CODES:   Not applicable  INTERVENTION:   -Ensure Enlive po BID, each supplement provides 350 kcal and 20 grams of protein -MVI with minerals daily -Continue regular diet  NUTRITION DIAGNOSIS:   Increased nutrient needs related to post-op healing as evidenced by estimated needs.  GOAL:   Patient will meet greater than or equal to 90% of their needs  MONITOR:   PO intake, Supplement acceptance  REASON FOR ASSESSMENT:   Consult Assessment of nutrition requirement/status, Hip fracture protocol  ASSESSMENT:   Pt with medical history significant for type diabetes mellitus, GERD, hypertension, gout, dyslipidemia, osteoporosis, TIA and temporal arthritis who presented with acute onset of accidental mechanical fall with subsequent right hip pain.  Pt admitted with closed rt hip fracture s/p fall.   9/8- s/p right hip unipolar hemiarthroplasty.   Reviewed I/O's: +1.5 L x 24 hours and +1.2 L since admission  Spoke with pt, who was sitting in recliner chair at time of visit. Pt shares that she feels a little better today and is encouraged to improve so she can go home. Pt lives alone, but has help from her nephews abd friends.   Pt typically has a good appetite. She consumes 2 meals per day (Frozen dinner from [place]). Pt also drinks a lot of green tea and sweet tea, Noted meal completions 25-90%. Pt reports that she did not each much for breakfast, as her eggs were "too gritty".   Pt unsure if she has lost weight and unsure of UBW. No wt loss noted.   Discussed importance of good meal and supplement intake to promote healing. Pt amenable to supplements.   Per chart review, pt is followed by hospice PTA. Pt may revoke her hospice benefits to pursue rehab.   Medications reviewed and include calcium citrate, vitamin D3, vitamin B-12, folic acid, neurontin, potassium chloride, and aldactone.   Labs reviewed: CBGS: 76 (inpatient orders  for glycemic control are none).    NUTRITION - FOCUSED PHYSICAL EXAM:  Flowsheet Row Most Recent Value  Orbital Region No depletion  Upper Arm Region Mild depletion  Thoracic and Lumbar Region No depletion  Buccal Region No depletion  Temple Region Mild depletion  Clavicle Bone Region No depletion  Clavicle and Acromion Bone Region No depletion  Scapular Bone Region No depletion  Dorsal Hand Mild depletion  Patellar Region No depletion  Anterior Thigh Region No depletion  Posterior Calf Region No depletion  Edema (RD Assessment) Mild  Hair Reviewed  Eyes Reviewed  Mouth Reviewed  Skin Reviewed  Nails Reviewed       Diet Order:   Diet Order             Diet regular Room service appropriate? Yes; Fluid consistency: Thin  Diet effective now                   EDUCATION NEEDS:   Education needs have been addressed  Skin:  Skin Assessment: Skin Integrity Issues: Skin Integrity Issues:: Incisions Incisions: closed rt hip  Last BM:  05/22/23 (type 6)  Height:   Ht Readings from Last 1 Encounters:  05/20/23 5\' 7"  (1.702 m)    Weight:   Wt Readings from Last 1 Encounters:  05/20/23 60.7 kg    Ideal Body Weight:  61.4 kg  BMI:  Body mass index is 20.97 kg/m.  Estimated Nutritional Needs:   Kcal:  1650-1850  Protein:  80-95 grams  Fluid:  > 1.6 L  Levada Schilling, RD, LDN, CDCES Registered Dietitian II Certified Diabetes Care and Education Specialist Please refer to [facility] for RD and/or RD on-call/weekend/after hours pager

## 2023-05-22 NOTE — Progress Notes (Signed)
Civil engineer, contracting Hospitalized Hospice Patient   [name] is a current hospice patient followed at home for terminal diagnosis of respiratory failure.  Patient suffered a fall on 9.7.24 and was sent to the ED by hospice nurse for further examination and testing.  Patient was admitted 9.7.24 with right hip fracture.  Patient had right hemiarthroplasty on 9.8.24  Per Dr. Gordy Savers, hospice physician, this is a related hospital admission.   Patient is appropriate for hospice GIP requiring skilled interventions following right hip fracture & repair to manage pain and ongoing assessment of symptoms.   [name] is lying in bed resting with her eyes closed.  She opens her eyes to verbal stimuli.  She is pleasantly confused to place and why she is in the hospital.  No plans for discharge today.  PT consult pending.  Vital Signs:  T-97.5 oral, BP 145/60, P 80, R17, Oxi 96%  I&O: Intake 1501.17ml Output 50ml   Abnormal labs:  HGB 8.7, HCT 28.3, RBC 3.32, glucose 167, Calcium 8.4   IV/PRN Meds:  Ancef 2g every 6 hours  Marcaine block 9.8.24 Hydrocodone 5-325mg  1-2 every 6 hours for pain.   Labetalol 20mg  IV 9.8 @ 0745 & 1152  Problem list:     Closed right hip fracture (HCC) - 9.9.24- Day 1 post op right hemiarthroplasty    Hypertensive urgency - We will continue her antihypertensive therapy. - This could be related to pain. -  as needed IV labetalol.   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 - We will continue Neurontin. - We will hold off metformin.   Dyslipidemia - We will continue statin therapy.   Depression - We will continue Zoloft.   GERD without esophagitis - We will need PPI therapy.   Rheumatoid arthritis (HCC) - We will continue methotrexate.     Discharge Planning:  Ongoing-    Family contact:  Worthy Keeler, hospice PCG.  He is currently out of  town at a conference. He will be available by phone and ask that a message be left for him if he does not answer the phone.  Thereasa Distance should be primary contact for hospital.   IDT:  Updated     Goals of Care- comfort approach.  Thereasa Distance does state he feels patient will need rehab- as he is her neighbor, primary caregiver, who works a lot and is not always available- so she will need to be able to ambulate and be as independent as possible when she returns home.  Hospital Liaison team will continue to collaborate with hospital team, patient/PCG through final disposition.  Norris Cross, RN Nurse Liaison [phone]

## 2023-05-22 NOTE — Progress Notes (Addendum)
Progress Note   Patient: Wanda Monroe BJY:[id number] DOB: 03/16/1941 DOA: 05/20/2023     2 DOS: the patient was seen and examined on 05/22/2023   Brief hospital course: HPI on admission 05/20/23 PM: "Wanda Monroe is a 82 y.o. Caucasian female with medical history significant for type diabetes mellitus, GERD, hypertension, gout, dyslipidemia, osteoporosis, TIA and temporal arthritis who presented to the emergency room with acute onset of accidental mechanical fall with subsequent right hip pain.  She was having a bowel movement and was getting off of the commode when she lost her balance.  No head injuries.  She denies any presyncope or syncope. ..."  ED course - BP 186/81, RR 21 otherwise normal vitals.  BMP was unremarkable.  CBC showed chronic anemia with Hbg 10.3 (from 11.1 in July 2023).  UA negative for infection.   Chest xray negative. Trauma imaging including CT's of head, C-spine and Right Hip revealed an acute right femoral neck fracture.   Patient was admitted to medicine service with orthopedic surgery consulted.  Further details of hospital course and management as outlined below.    Assessment and Plan: * Closed right hip fracture (HCC) --Mgmt per Orthopedic Surgery --Underwent Right hip unipolar hemiarthroplasty on 05/21/23 with Dr. 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 Appreciate input and care.  GERD without esophagitis Protonix  Rheumatoid arthritis (HCC) Continue methotrexate        Subjective: Pt seen up in recliner this AM.  She denies significant pain.  Reports minimal pain with going from bed to chair this AM.  She wants to go home with help of hospice and therapy in the home.  Denies any acute complaints.  Perseverates on figuring who can give her a ride home.   Physical Exam: Vitals:   05/21/23 2002 05/21/23 2348 05/22/23 0352 05/22/23 1032  BP: (!) 143/56 (!) 157/58 (!) 145/60 (!) 108/53  Pulse: 86 87 80 70  Resp: 17 17 17    Temp: 98.3 F (36.8 C) 98 F (36.7 C) 98 F (36.7 C) 97.6 F (36.4 C)  TempSrc:   Oral   SpO2: 95% 94% 96% 100%  Weight:      Height:       General exam: awake, alert, no acute distress HEENT: moist mucus membranes, hearing grossly normal  Respiratory system: on room air, normal respiratory effort. Cardiovascular system: RRR, no pedal edema.   Gastrointestinal system: soft, NT, ND Central nervous system: A&O x 3. no gross focal neurologic deficits, normal speech Extremities: right hip dressing in place clean dry intact, no edema, normal tone Skin: dry, intact, normal temperature Psychiatry: normal mood, congruent affect   Data Reviewed:  Notable labs ---  Glucose 119, Ca 8.6, Hbg 9.3  Family Communication: son and nephew at bedside today  Disposition: Status is: Inpatient Remains inpatient appropriate because: post-op hip fracture, ongoing dispo planning with SNF  recommended   Planned Discharge Destination: Skilled nursing facility vs Home health with hospice     Time spent: 35 minutes  Author: Pennie Banter, DO 05/22/2023 3:15 PM  For on call review [email or web address].

## 2023-05-23 DIAGNOSIS — D62 Acute posthemorrhagic anemia: Secondary | ICD-10-CM

## 2023-05-23 LAB — CBC
HCT: 28.5 % — ABNORMAL LOW (ref 36.0–46.0)
Hemoglobin: 8.7 g/dL — ABNORMAL LOW (ref 12.0–15.0)
MCH: 25.8 pg — ABNORMAL LOW (ref 26.0–34.0)
MCHC: 30.5 g/dL (ref 30.0–36.0)
MCV: 84.6 fL (ref 80.0–100.0)
Platelets: 322 10*3/uL (ref 150–400)
RBC: 3.37 MIL/uL — ABNORMAL LOW (ref 3.87–5.11)
RDW: 15.9 % — ABNORMAL HIGH (ref 11.5–15.5)
WBC: 9 10*3/uL (ref 4.0–10.5)
nRBC: 0 % (ref 0.0–0.2)

## 2023-05-23 LAB — FERRITIN: Ferritin: 43 ng/mL (ref 11–307)

## 2023-05-23 LAB — IRON AND TIBC
Iron: 20 ug/dL — ABNORMAL LOW (ref 28–170)
Saturation Ratios: 6 % — ABNORMAL LOW (ref 10.4–31.8)
TIBC: 318 ug/dL (ref 250–450)
UIBC: 298 ug/dL

## 2023-05-23 MED ORDER — FERROUS SULFATE 325 (65 FE) MG PO TABS
325.0000 mg | ORAL_TABLET | Freq: Every day | ORAL | Status: DC
  Administered 2023-05-24 – 2023-05-25 (×2): 325 mg via ORAL
  Filled 2023-05-23 (×2): qty 1

## 2023-05-23 NOTE — Progress Notes (Signed)
Progress Note   Patient: Wanda Monroe YIR:[id number] DOB: [birth year] DOA: 05/20/2023     3 DOS: the patient was seen and examined on 05/23/2023   Brief hospital course: HPI on admission 05/20/23 PM: "Wanda Monroe is a 82 y.o. Caucasian female with medical history significant for type diabetes mellitus, GERD, hypertension, gout, dyslipidemia, osteoporosis, TIA and temporal arthritis who presented to the emergency room with acute onset of accidental mechanical fall with subsequent right hip pain.  She was having a bowel movement and was getting off of the commode when she lost her balance.  No head injuries.  She denies any presyncope or syncope. ..."  ED course - BP 186/81, RR 21 otherwise normal vitals.  BMP was unremarkable.  CBC showed chronic anemia with Hbg 10.3 (from 11.1 in July 2023).  UA negative for infection.   Chest xray negative. Trauma imaging including CT's of head, C-spine and Right Hip revealed an acute right femoral neck fracture.   Patient was admitted to medicine service with orthopedic surgery consulted.  Further details of hospital course and management as outlined below.    Assessment and Plan: * Closed right hip fracture (HCC) --Mgmt per Orthopedic Surgery --Underwent Right hip unipolar hemiarthroplasty on 05/21/23 with Dr. Joice Lofts --Pain control PRN --Bowel regimen --PT/OT evaluations post-op --Ortho recommends: ----WBAT on right leg ----Lovenox, TED hose for DVT ppx   Acute postoperative anemia due to expected blood loss Iron deficiency anemia Hbg trend 10.3 >> 9.3 >> 8.7>> 8.7 Iron studies show deficiency --Consider iron infusion if deficient --Monitor CBC --Start iron supplement  Hypertensive urgency Hx of Hypertension BP on initially 186/81, later 195/78, suspect due to pain from hip fracture. BP's more controlled, stable --Continue hydrochlorothiazide, spironolactone --IV labetalol PRN  Well controlled type 2 diabetes mellitus with peripheral  neuropathy (HCC) --Sliding scale Novolog  --Continue Neurontin --Hold metformin  Dyslipidemia Continue statin  Depression Continue Zoloft  Hospice care patient [facility] Liaison is following. Appreciate input and care.  GERD without esophagitis Protonix  Rheumatoid arthritis (HCC) Continue methotrexate        Subjective: Pt seen up in recliner.  She reports moderate pain.  Making progress with ambulating.  Declines to go to SNF, stating she will go home and have PT with hospice.  She states her neighbor helps her any time she needs it.   Physical Exam: Vitals:   05/22/23 1032 05/22/23 1628 05/22/23 2311 05/23/23 0953  BP: (!) 108/53 (!) 145/64 (!) 146/68   Pulse: 70 92 83 81  Resp:  16 16 16   Temp: 97.6 F (36.4 C) 98.6 F (37 C) 98.5 F (36.9 C) 98.2 F (36.8 C)  TempSrc:      SpO2: 100% 98% 96% 98%  Weight:      Height:       General exam: awake, alert, no acute distress HEENT: moist mucus membranes, hearing grossly normal  Respiratory system: on room air, normal respiratory effort. CTAB no wheezes or rhonchi Cardiovascular system: RRR, no pedal edema.   Gastrointestinal system: soft, NT, ND Central nervous system: A&O x 3. no gross focal neurologic deficits, normal speech Extremities: right hip dressing in place clean dry intact, no edema, normal tone Skin: dry, intact, normal temperature Psychiatry: normal mood, congruent affect   Data Reviewed:  Notable labs ---  Iron 20, sat ratio 6%, normal TIBC and ferritin Hbg 8.7 x 2 (10.3 on admission) stable.  Family Communication: son and nephew at bedside today  Disposition: Status is: Inpatient  Remains inpatient appropriate because: awaiting SNF placement. Medically stable.    Planned Discharge Destination: Skilled nursing facility vs Home health with hospice     Time spent: 35 minutes  Author: Pennie Banter, DO 05/23/2023 5:06 PM  For on call review [email or web address].

## 2023-05-23 NOTE — TOC Progression Note (Signed)
Transition of Care [facility]) - Progression Note    Patient Details  Name: Wanda Monroe MRN: [id number] Date of Birth: [birth year]  Transition of Care [facility]) CM/SW Contact  Marlowe Sax, RN Phone Number: 05/23/2023, 11:34 AM  Clinical Narrative:     Reached out to [facility] Annice Pih) to inquire if the patient will be able to get PT and OT at home with Hospice, he is going to check and let me know       Expected Discharge Plan and Services                                               Social Determinants of Health (SDOH) Interventions SDOH Screenings   Food Insecurity: No Food Insecurity (05/21/2023)  Housing: Low Risk  (05/21/2023)  Transportation Needs: No Transportation Needs (05/21/2023)  Utilities: Not At Risk (05/21/2023)  Tobacco Use: Low Risk  (05/20/2023)    Readmission Risk Interventions     No data to display

## 2023-05-23 NOTE — Progress Notes (Signed)
Physical Therapy Treatment Patient Details Name: Wanda Monroe MRN: [id number] DOB: 1940/12/13 Today's Date: 05/23/2023   History of Present Illness Pt is an 82 y.o. female presenting to hospital 05/20/23 s/p fall at home.  Imaging showing R femoral neck fx.  Pt also noted with hypertensive urgency.  Pt s/p R hip unipolar hemiarthroplasty 05/21/23 d/t R hip displaced femoral neck fx.  Per notes pt is a hospice pt (for terminal diagnosis of respiratory failure).  PMH includes DM, htn, gout, TIA, temporal arthritis, anemia, L foot neuropathy, back sx.    PT Comments  Pt resting in bed upon PT arrival; agreeable to therapy.  Minimal R hip pain at rest beginning of session; 5-6/10 R hip pain with activity and end of session at rest--pt declining pain meds--nurse updated on pt's pain.  During session pt SBA with bed mobility; SBA with transfers; and SBA to ambulate 140 feet with RW use.  No loss of balance noted during sessions activities.  Pt unable to verbalize posterior hip precautions (pt reports having a "bad memory") but pt able to reference paper handout with precautions.  Pt requesting home discharge.  Will continue to focus on strengthening and progressive functional mobility during hospitalization.    If plan is discharge home, recommend the following: A little help with walking and/or transfers;A little help with bathing/dressing/bathroom;Assistance with cooking/housework;Assist for transportation;Help with stairs or ramp for entrance   Can travel by private vehicle        Equipment Recommendations  Rolling walker (2 wheels);BSC/3in1    Recommendations for Other Services       Precautions / Restrictions Precautions Precautions: Posterior Hip;Fall Precaution Booklet Issued: Yes (comment) Restrictions Weight Bearing Restrictions: Yes RLE Weight Bearing: Weight bearing as tolerated     Mobility  Bed Mobility Overal bed mobility: Needs Assistance Bed Mobility: Supine to Sit, Sit to  Supine     Supine to sit: Supervision Sit to supine: Supervision   General bed mobility comments: increased effort to perform on own    Transfers Overall transfer level: Needs assistance Equipment used: Rolling walker (2 wheels) Transfers: Sit to/from Stand Sit to Stand: Supervision           General transfer comment: mild increased effort to stand but steady    Ambulation/Gait Ambulation/Gait assistance: Supervision Gait Distance (Feet): 140 Feet Assistive device: Rolling walker (2 wheels)   Gait velocity: decreased     General Gait Details: antalgic; decreased stance time R LE; decreased B LE step length/foot clearance   Stairs             Wheelchair Mobility     Tilt Bed    Modified Rankin (Stroke Patients Only)       Balance Overall balance assessment: Needs assistance Sitting-balance support: No upper extremity supported, Feet supported Sitting balance-Leahy Scale: Good Sitting balance - Comments: steady reaching within BOS   Standing balance support: During functional activity, Bilateral upper extremity supported Standing balance-Leahy Scale: Good Standing balance comment: steady ambulating with RW use                            Cognition Arousal: Alert Behavior During Therapy: WFL for tasks assessed/performed Overall Cognitive Status: Within Functional Limits for tasks assessed                                 General Comments: A&Ox4  Exercises    General Comments        Pertinent Vitals/Pain Pain Assessment Pain Assessment: 0-10 Pain Score: 6  Pain Location: R hip Pain Descriptors / Indicators: Discomfort, Sore Pain Intervention(s): Limited activity within patient's tolerance, Monitored during session, Repositioned (pt declining pain meds--nurse notified) Vitals (HR and SpO2 on room air) stable and WFL throughout treatment session.    Home Living         Prior Function            PT  Goals (current goals can now be found in the care plan section) Acute Rehab PT Goals Patient Stated Goal: to improve mobility Time For Goal Achievement: 06/05/23 Potential to Achieve Goals: Good Progress towards PT goals: Progressing toward goals    Frequency    BID      PT Plan      Co-evaluation              AM-PAC PT "6 Clicks" Mobility   Outcome Measure  Help needed turning from your back to your side while in a flat bed without using bedrails?: A Little Help needed moving from lying on your back to sitting on the side of a flat bed without using bedrails?: A Little Help needed moving to and from a bed to a chair (including a wheelchair)?: A Little Help needed standing up from a chair using your arms (e.g., wheelchair or bedside chair)?: A Little Help needed to walk in hospital room?: A Little Help needed climbing 3-5 steps with a railing? : A Little 6 Click Score: 18    End of Session Equipment Utilized During Treatment: Gait belt Activity Tolerance: Patient limited by fatigue Patient left: in bed;with call bell/phone within reach;with bed alarm set;with SCD's reapplied;Other (comment) (folded blanket between pt's knees for posterior THP's) Nurse Communication: Mobility status;Precautions;Other (comment) (pt's pain status) PT Visit Diagnosis: Unsteadiness on feet (R26.81);Muscle weakness (generalized) (M62.81);History of falling (Z91.81);Other abnormalities of gait and mobility (R26.89);Pain Pain - Right/Left: Right Pain - part of body: Hip     Time: 1027-2536 PT Time Calculation (min) (ACUTE ONLY): 18 min  Charges:    $Therapeutic Activity: 8-22 mins PT General Charges $$ ACUTE PT VISIT: 1 Visit                    Hendricks Limes, PT 05/23/23, 6:35 PM

## 2023-05-23 NOTE — Plan of Care (Signed)

## 2023-05-23 NOTE — Progress Notes (Signed)
[facility] Liaison Note  Hospital Liaison spoke with patient this afternoon. She stated that she plans on returning home when medically stable for discharge.  She stated that she did not want to go to an SNF facility for rehab.  HL informed her that [facility] can provide PT and OT at her home, but it would not be as extensive as PT and OT services in a SNF or with a regular home health agency. She stated that she understood.  HL informed her that her neighbor, Thereasa Distance, preferred that she go to an SNF for short term rehab.  She stated that she would discuss this with him, but her plan is to return home.    [facility] Liaison [phone]

## 2023-05-23 NOTE — Evaluation (Signed)
Occupational Therapy Evaluation Patient Details Name: Wanda Monroe MRN: [id number] DOB: [birth year] Today's Date: 05/23/2023   History of Present Illness Pt is an 82 y.o. female presenting to hospital 05/20/23 s/p fall at home.  Imaging showing R femoral neck fx.  Pt also noted with hypertensive urgency.  Pt s/p R hip unipolar hemiarthroplasty 05/21/23 d/t R hip displaced femoral neck fx.  Per notes pt is a hospice pt (for terminal diagnosis of respiratory failure).  PMH includes DM, htn, gout, TIA, temporal arthritis, anemia, L foot neuropathy, back sx.   Clinical Impression   Wanda Monroe was seen for OT evaluation this date. Prior to hospital admission, pt was MOD I, assist for bathing from hospice aids. Pt lives alone, nephew spends the night and friend lives/works nearby to assist as needed. Pt currently requires SUPERVISION + RW toilet t/f, pericare sitting, and hand washing standing sink side. Poor recall of posterior hip precautions (0/3 recalled) however noted safe transfers with no breaking of precautions.  Upon hospital discharge, recommend OT follow up.    If plan is discharge home, recommend the following: A little help with bathing/dressing/bathroom;Supervision due to cognitive status;Help with stairs or ramp for entrance    Functional Status Assessment  Patient has had a recent decline in their functional status and demonstrates the ability to make significant improvements in function in a reasonable and predictable amount of time.  Equipment Recommendations  None recommended by OT    Recommendations for Other Services       Precautions / Restrictions Precautions Precautions: Posterior Hip;Fall Precaution Booklet Issued: Yes (comment) Restrictions Weight Bearing Restrictions: Yes RLE Weight Bearing: Weight bearing as tolerated      Mobility Bed Mobility Overal bed mobility: Needs Assistance Bed Mobility: Supine to Sit, Sit to Supine     Supine to sit: Supervision Sit to  supine: Supervision   General bed mobility comments: setup for use of leg lifter to return RLE to bed    Transfers Overall transfer level: Needs assistance Equipment used: Rolling walker (2 wheels) Transfers: Sit to/from Stand Sit to Stand: Supervision                  Balance Overall balance assessment: Needs assistance Sitting-balance support: No upper extremity supported, Feet supported Sitting balance-Leahy Scale: Good     Standing balance support: No upper extremity supported, During functional activity Standing balance-Leahy Scale: Fair                             ADL either performed or assessed with clinical judgement   ADL Overall ADL's : Needs assistance/impaired                                       General ADL Comments: SUPERVISION + RW toilet t/f, pericare sitting, and hand washing standing sink side.      Pertinent Vitals/Pain Pain Assessment Pain Assessment: No/denies pain     Extremity/Trunk Assessment Upper Extremity Assessment Upper Extremity Assessment: Overall WFL for tasks assessed   Lower Extremity Assessment Lower Extremity Assessment: Generalized weakness       Communication Communication Communication: No apparent difficulties;Difficulty communicating thoughts/reduced clarity of speech Following commands: Follows one step commands with increased time Cueing Techniques: Verbal cues;Visual cues   Cognition Arousal: Alert Behavior During Therapy: WFL for tasks assessed/performed Overall Cognitive Status: Within Functional Limits for  tasks assessed                                        Home Living Family/patient expects to be discharged to:: Private residence Living Arrangements: Alone Available Help at Discharge: Friend(s);Available PRN/intermittently Type of Home: House Home Access: Ramped entrance     Home Layout: One level               Home Equipment: Cane -  quad;Toilet riser;BSC/3in1   Additional Comments: Nephew spends the night (lives next door) and frined lives/works nearby - states he comes any time she calls for help      Prior Functioning/Environment Prior Level of Function : Independent/Modified Independent               ADLs Comments: friend drives pt to grocery store        OT Problem List: Decreased strength;Decreased range of motion;Decreased activity tolerance;Impaired balance (sitting and/or standing);Decreased cognition      OT Treatment/Interventions: Self-care/ADL training;Therapeutic exercise;Energy conservation;DME and/or AE instruction;Therapeutic activities;Balance training;Patient/family education    OT Goals(Current goals can be found in the care plan section) Acute Rehab OT Goals Patient Stated Goal: to go home OT Goal Formulation: With patient Time For Goal Achievement: 06/06/23 Potential to Achieve Goals: Good ADL Goals Pt Will Perform Grooming: standing;with modified independence Pt Will Perform Lower Body Dressing: with modified independence;sit to/from stand (no cues for posterior hip pcns) Pt Will Transfer to Toilet: with modified independence;ambulating;regular height toilet  OT Frequency: Min 1X/week    Co-evaluation              AM-PAC OT "6 Clicks" Daily Activity     Outcome Measure Help from another person eating meals?: None Help from another person taking care of personal grooming?: A Little Help from another person toileting, which includes using toliet, bedpan, or urinal?: A Little Help from another person bathing (including washing, rinsing, drying)?: A Little Help from another person to put on and taking off regular upper body clothing?: None Help from another person to put on and taking off regular lower body clothing?: A Lot 6 Click Score: 19   End of Session    Activity Tolerance: Patient tolerated treatment well Patient left: in bed;with call bell/phone within reach;with  bed alarm set  OT Visit Diagnosis: Unsteadiness on feet (R26.81);Muscle weakness (generalized) (M62.81)                Time: 4098-1191 OT Time Calculation (min): 21 min Charges:  OT General Charges $OT Visit: 1 Visit OT Evaluation $OT Eval Low Complexity: 1 Low OT Treatments $Self Care/Home Management : 8-22 mins  Kathie Dike, M.S. OTR/L  05/23/23, 3:26 PM  ascom [phone]

## 2023-05-23 NOTE — Progress Notes (Signed)
Subjective: 2 Days Post-Op Procedure(s) (LRB): ARTHROPLASTY BIPOLAR HIP (HEMIARTHROPLASTY) (Right) Patient reports pain as mild in the right hip this morning.   Patient is well, confusion is much improved this morning. PT and care management to assist with discharge planning.  She was on hospice prior to recent admission Negative for chest pain and shortness of breath Fever: no Gastrointestinal:Negative for nausea and vomiting  Objective: Vital signs in last 24 hours: Temp:  [97.6 F (36.4 C)-98.6 F (37 C)] 98.5 F (36.9 C) (09/09 2311) Pulse Rate:  [70-92] 83 (09/09 2311) Resp:  [16] 16 (09/09 2311) BP: (108-146)/(53-68) 146/68 (09/09 2311) SpO2:  [96 %-100 %] 96 % (09/09 2311)  Intake/Output from previous day:  Intake/Output Summary (Last 24 hours) at 05/23/2023 0733 Last data filed at 05/23/2023 0605 Gross per 24 hour  Intake 120 ml  Output 0 ml  Net 120 ml    Intake/Output this shift: No intake/output data recorded.  Labs: Recent Labs    05/20/23 2137 05/21/23 0526 05/22/23 0509 05/23/23 0546  HGB 10.3* 9.3* 8.7* 8.7*   Recent Labs    05/22/23 0509 05/23/23 0546  WBC 7.4 9.0  RBC 3.32* 3.37*  HCT 28.3* 28.5*  PLT 274 322   Recent Labs    05/21/23 0526 05/22/23 0509  NA 139 139  K 3.7 4.2  CL 103 106  CO2 28 25  BUN 18 17  CREATININE 0.84 0.75  GLUCOSE 119* 167*  CALCIUM 8.6* 8.4*   No results for input(s): "LABPT", "INR" in the last 72 hours.   EXAM General - Patient is Alert and Appropriate Extremity - ABD soft Neurovascular intact Dorsiflexion/Plantar flexion intact Incision: dressing C/D/I No cellulitis present Compartment soft Dressing/Incision - Honeycomb dressing to the right hip intact without any drainage noted. Motor Function - intact, moving foot and toes well on exam.  Abdomen soft with intact bowel sounds this AM.  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   Temporal arteritis (HCC)    Wears dentures    full top, partial bottom    Assessment/Plan: 2 Days Post-Op Procedure(s) (LRB): ARTHROPLASTY BIPOLAR HIP (HEMIARTHROPLASTY) (Right) Principal Problem:   Closed right hip fracture (HCC) Active Problems:   Depression   Dyslipidemia   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   Weight as of this encounter: 60.7 kg. Advance diet Up with therapy D/C IV fluids when tolerating po intake.  Labs and vitals reviewed this AM.  WBC 9.0 this morning. Hg 8.7 this AM. Up with therapy today, she was on hospice prior to admission per the patient.  Confusion is much improved this AM. Continue to work on BM.  DVT Prophylaxis -  Lovenox and TED hose Weight-Bearing as tolerated to right leg  J. Horris Latino, PA-C [facility] Orthopaedic Surgery 05/23/2023, 7:33 AM

## 2023-05-23 NOTE — Progress Notes (Signed)
Civil engineer, contracting Hospitalized Hospice Patient   [name] is a current hospice patient followed at home for terminal diagnosis of respiratory failure.  Patient suffered a fall on 9.7.24 and was sent to the ED by hospice nurse for further examination and testing.  Patient was admitted 9.7.24 with right hip fracture.  Patient had right hemiarthroplasty on 9.8.24  Per Dr. Gordy Savers, hospice physician, this is a related hospital admission.   Patient is appropriate for hospice GIP requiring skilled interventions following right hip fracture & repair to manage pain and ongoing assessment of symptoms.   [name] is lying in bed asleep.  Breakfast tray by her bed.   Vital Signs:  T-98.5 oral, BP 146/68, P 83, R16, Oxi 96% on room air   I&O: Intake Output not recorded   Abnormal labs:  HGB 8.7, HCT 28.3, RBC 3.32, glucose 167, Calcium 8.4 (no new labs)   IV/PRN Meds: n/a  Problem list:     Closed right hip fracture (HCC) - 9.9.24- Day 1 post op right hemiarthroplasty    Hypertensive urgency - We will continue her antihypertensive therapy. - This could be related to pain. -  as needed IV labetalol.   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 - We will continue Neurontin. - We will hold off metformin.   Dyslipidemia - We will continue statin therapy.   Depression - We will continue Zoloft.   GERD without esophagitis - We will need PPI therapy.   Rheumatoid arthritis (HCC) - We will continue methotrexate.     Discharge Planning:  Ongoing-    Family contact:  Wanda Monroe, hospice PCG.  He is currently out of town at a conference. He will be available by phone and ask that a message be left for him if he does not answer the phone.  Wanda Monroe should be primary contact for hospital.   IDT:  Updated     Goals of Care- comfort approach.  Wanda Monroe does state he feels  patient will need rehab- as he is her neighbor, primary caregiver, who works a lot and is not always available- so she will need to be able to ambulate and be as independent as possible when she returns home. TOC stated in her documentation, later Monday afternoon, that patient does not want to go to SNF for rehab.  TOC will touch base with patient again, today, to discuss.     Hospital Liaison team will continue to collaborate with hospital team, patient/PCG through final disposition.  [facility] Liaison [phone]

## 2023-05-23 NOTE — Progress Notes (Signed)
Physical Therapy Treatment Patient Details Name: Wanda Monroe MRN: [id number] DOB: 08/17/1941 Today's Date: 05/23/2023   History of Present Illness Pt is an 82 y.o. female presenting to hospital 05/20/23 s/p fall at home.  Imaging showing R femoral neck fx.  Pt also noted with hypertensive urgency.  Pt s/p R hip unipolar hemiarthroplasty 05/21/23 d/t R hip displaced femoral neck fx.  Per notes pt is a hospice pt (for terminal diagnosis of respiratory failure).  PMH includes DM, htn, gout, TIA, temporal arthritis, anemia, L foot neuropathy, back sx.    PT Comments  Pt resting in recliner upon PT arrival; agreeable to therapy; no c/o pain at rest (7/10 R hip pain with walking; 6/10 R hip pain at rest end of session--nurse notified of pt's request for pain meds).  During session pt CGA with transfers and ambulation 120 feet with RW use (limited distance d/t SOB--pt reports h/o SOB limiting activity).  Pt progressing with functional mobility.  Recommend OT consult to address ADL's.  Will continue to focus on strengthening and progressive functional mobility during hospitalization.   If plan is discharge home, recommend the following: A little help with walking and/or transfers;A little help with bathing/dressing/bathroom;Assistance with cooking/housework;Direct supervision/assist for medications management;Direct supervision/assist for financial management;Assist for transportation;Help with stairs or ramp for entrance;Supervision due to cognitive status   Can travel by private vehicle        Equipment Recommendations  Rolling walker (2 wheels);BSC/3in1    Recommendations for Other Services       Precautions / Restrictions Precautions Precautions: Posterior Hip;Fall Precaution Booklet Issued: Yes (comment) Restrictions Weight Bearing Restrictions: Yes RLE Weight Bearing: Weight bearing as tolerated     Mobility  Bed Mobility               General bed mobility comments: Deferred (pt in  recliner beginning/end of session)    Transfers Overall transfer level: Needs assistance Equipment used: Rolling walker (2 wheels) Transfers: Sit to/from Stand Sit to Stand: Contact guard assist           General transfer comment: vc's for UE/LE placement and posterior hip precautions; increased effort to stand    Ambulation/Gait Ambulation/Gait assistance: Contact guard assist Gait Distance (Feet): 120 Feet Assistive device: Rolling walker (2 wheels)   Gait velocity: decreased     General Gait Details: antalgic; decreased stance time R LE; decreased B LE step length/foot clearance; vc's for walker use   Stairs             Wheelchair Mobility     Tilt Bed    Modified Rankin (Stroke Patients Only)       Balance Overall balance assessment: Needs assistance Sitting-balance support: No upper extremity supported, Feet supported Sitting balance-Leahy Scale: Good Sitting balance - Comments: steady reaching within BOS   Standing balance support: Bilateral upper extremity supported, Reliant on assistive device for balance Standing balance-Leahy Scale: Good Standing balance comment: steady ambulating with RW use                            Cognition Arousal: Alert Behavior During Therapy: WFL for tasks assessed/performed Overall Cognitive Status: Within Functional Limits for tasks assessed                                 General Comments: A&Ox4        Exercises Total Joint  Exercises Long Arc Quad: AROM, Strengthening, Both, 10 reps, Seated    General Comments  Pt agreeable to PT session.      Pertinent Vitals/Pain Pain Assessment Pain Assessment: 0-10 Pain Score: 6  Pain Location: R hip Pain Descriptors / Indicators: Discomfort, Sore Pain Intervention(s): Limited activity within patient's tolerance, Monitored during session, Repositioned, Patient requesting pain meds-RN notified Vitals (HR and SpO2 on room air) stable and  WFL throughout treatment session.    Home Living                          Prior Function            PT Goals (current goals can now be found in the care plan section) Acute Rehab PT Goals Patient Stated Goal: to improve mobility PT Goal Formulation: With patient Time For Goal Achievement: 06/05/23 Potential to Achieve Goals: Good Progress towards PT goals: Progressing toward goals    Frequency    BID      PT Plan      Co-evaluation              AM-PAC PT "6 Clicks" Mobility   Outcome Measure  Help needed turning from your back to your side while in a flat bed without using bedrails?: A Little Help needed moving from lying on your back to sitting on the side of a flat bed without using bedrails?: A Little Help needed moving to and from a bed to a chair (including a wheelchair)?: A Little Help needed standing up from a chair using your arms (e.g., wheelchair or bedside chair)?: A Little Help needed to walk in hospital room?: A Little Help needed climbing 3-5 steps with a railing? : A Little 6 Click Score: 18    End of Session Equipment Utilized During Treatment: Gait belt Activity Tolerance: Patient limited by fatigue;Patient limited by pain Patient left: in chair;with call bell/phone within reach;with chair alarm set;Other (comment) (pillow between pt's knees for posterior THP's) Nurse Communication: Mobility status;Precautions;Patient requests pain meds PT Visit Diagnosis: Unsteadiness on feet (R26.81);Muscle weakness (generalized) (M62.81);History of falling (Z91.81);Other abnormalities of gait and mobility (R26.89);Pain Pain - Right/Left: Right Pain - part of body: Hip     Time: 1040-1103 PT Time Calculation (min) (ACUTE ONLY): 23 min  Charges:    $Gait Training: 8-22 mins $Therapeutic Activity: 8-22 mins PT General Charges $$ ACUTE PT VISIT: 1 Visit                     Hendricks Limes, PT 05/23/23, 11:57 AM

## 2023-05-24 DIAGNOSIS — S72001A Fracture of unspecified part of neck of right femur, initial encounter for closed fracture: Secondary | ICD-10-CM | POA: Diagnosis not present

## 2023-05-24 DIAGNOSIS — W19XXXA Unspecified fall, initial encounter: Secondary | ICD-10-CM | POA: Diagnosis not present

## 2023-05-24 LAB — CBC
HCT: 33.2 % — ABNORMAL LOW (ref 36.0–46.0)
Hemoglobin: 10 g/dL — ABNORMAL LOW (ref 12.0–15.0)
MCH: 25.7 pg — ABNORMAL LOW (ref 26.0–34.0)
MCHC: 30.1 g/dL (ref 30.0–36.0)
MCV: 85.3 fL (ref 80.0–100.0)
Platelets: 362 10*3/uL (ref 150–400)
RBC: 3.89 MIL/uL (ref 3.87–5.11)
RDW: 16.1 % — ABNORMAL HIGH (ref 11.5–15.5)
WBC: 9.8 10*3/uL (ref 4.0–10.5)
nRBC: 0 % (ref 0.0–0.2)

## 2023-05-24 LAB — SURGICAL PATHOLOGY

## 2023-05-24 MED ORDER — ENOXAPARIN SODIUM 40 MG/0.4ML IJ SOSY: 40.0000 mg | PREFILLED_SYRINGE | INTRAMUSCULAR | 0 refills | Status: DC

## 2023-05-24 MED ORDER — DOCUSATE SODIUM 100 MG PO CAPS: 100.0000 mg | ORAL_CAPSULE | Freq: Two times a day (BID) | ORAL | 0 refills | Status: DC

## 2023-05-24 MED ORDER — ONDANSETRON HCL 4 MG PO TABS: 4.0000 mg | ORAL_TABLET | Freq: Four times a day (QID) | ORAL | 0 refills | Status: DC | PRN

## 2023-05-24 MED ORDER — FERROUS SULFATE 325 (65 FE) MG PO TABS: 325.0000 mg | ORAL_TABLET | Freq: Every day | ORAL | 3 refills | Status: DC

## 2023-05-24 MED ORDER — HYDROCODONE-ACETAMINOPHEN 5-325 MG PO TABS: 1.0000 | ORAL_TABLET | Freq: Four times a day (QID) | ORAL | 0 refills | Status: DC | PRN

## 2023-05-24 MED ORDER — APIXABAN 2.5 MG PO TABS: 2.5000 mg | ORAL_TABLET | Freq: Two times a day (BID) | ORAL | 0 refills | Status: DC

## 2023-05-24 MED ORDER — BISACODYL 10 MG RE SUPP: 10.0000 mg | Freq: Every day | RECTAL | 0 refills | Status: DC | PRN

## 2023-05-24 NOTE — Progress Notes (Signed)
Progress Note   Patient: Wanda Monroe:[id number] DOB: 1941/01/22 DOA: 05/20/2023     4 DOS: the patient was seen and examined on 05/24/2023              Subjective:  Patient discharged earlier today however she has now changed her mind that she would like to go to rehab recommended initially     Brief hospital course: HPI on admission 05/20/23 PM: "Wanda Monroe is a 82 y.o. Caucasian female with medical history significant for type diabetes mellitus, GERD, hypertension, gout, dyslipidemia, osteoporosis, TIA and temporal arthritis who presented to the emergency room with acute onset of accidental mechanical fall with subsequent right hip pain.  She was having a bowel movement and was getting off of the commode when she lost her balance.  No head injuries.  She denies any presyncope or syncope. ..."   ED course - BP 186/81, RR 21 otherwise normal vitals.  BMP was unremarkable.  CBC showed chronic anemia with Hbg 10.3 (from 11.1 in July 2023).  UA negative for infection.   Chest xray negative. Trauma imaging including CT's of head, C-spine and Right Hip revealed an acute right femoral neck fracture.    Patient was admitted to medicine service with orthopedic surgery consulted.   Further details of hospital course and management as outlined below.      Assessment and Plan: * Closed right hip fracture (HCC) --Mgmt per Orthopedic Surgery --Underwent Right hip unipolar hemiarthroplasty on 05/21/23 with Dr. Joice Lofts --Pain control PRN --Bowel regimen --PT/OT evaluations post-op --Ortho recommends: ----WBAT on right leg ----Lovenox, TED hose for DVT ppx     Acute postoperative anemia due to expected blood loss Iron deficiency anemia Hbg trend 10.3 >> 9.3 >> 8.7>> 8.7 Iron studies show deficiency --Consider iron infusion if deficient --Monitor CBC --Start iron supplement   Hypertensive urgency Hx of Hypertension BP on initially 186/81, later 195/78, suspect due to pain from hip  fracture. 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 Appreciate input and care.   GERD without esophagitis Protonix   Rheumatoid arthritis (HCC) Continue methotrexate    Physical Exam: General exam: awake, alert, no acute distress HEENT: moist mucus membranes, hearing grossly normal  Respiratory system: on room air, normal respiratory effort. CTAB no wheezes or rhonchi Cardiovascular system: RRR, no pedal edema.   Gastrointestinal system: soft, NT, ND Central nervous system: A&O x 3. no gross focal neurologic deficits, normal speech Extremities: right hip dressing in place clean dry intact, no edema, normal tone Skin: dry, intact, normal temperature Psychiatry: normal mood, congruent affect     Data Reviewed: Reviewed transition of care provider documentation as well as labs as shown below    Latest Ref Rng & Units 05/24/2023    6:12 AM 05/23/2023    5:46 AM 05/22/2023    5:09 AM  CBC  WBC 4.0 - 10.5 K/uL 9.8  9.0  7.4   Hemoglobin 12.0 - 15.0 g/dL 95.6  8.7  8.7   Hematocrit 36.0 - 46.0 % 33.2  28.5  28.3   Platelets 150 - 400 K/uL 362  322  274     Family Communication: son and nephew at bedside today   Disposition: Status is: Inpatient Remains inpatient  appropriate because: awaiting SNF placement. Medically stable.      Planned Discharge Destination: Skilled nursing facility vs Home health with hospice       Vitals:   05/23/23 1712 05/23/23 1754 05/23/23 2343 05/24/23 1707  BP: (!) 164/70 (!) 155/65 (!) 154/67 (!) 157/77  Pulse: 82 79 83 99  Resp: 16  14 18   Temp: 98.6 F (37 C)  99.1 F (37.3 C) 98.6 F (37 C)  TempSrc:      SpO2:  100%  96% 99%  Weight:      Height:         Author: Loyce Dys, MD 05/24/2023 5:56 PM  For on call review [email or web address].

## 2023-05-24 NOTE — Progress Notes (Addendum)
Occupational Therapy Treatment Patient Details Name: Wanda Monroe MRN: [id number] DOB: 01-04-1941 Today's Date: 05/24/2023   History of present illness Pt is an 82 y.o. female presenting to hospital 05/20/23 s/p fall at home.  Imaging showing R femoral neck fx.  Pt also noted with hypertensive urgency.  Pt s/p R hip unipolar hemiarthroplasty 05/21/23 d/t R hip displaced femoral neck fx.  Per notes pt is a hospice pt (for terminal diagnosis of respiratory failure).  PMH includes DM, htn, gout, TIA, temporal arthritis, anemia, L foot neuropathy, back sx.   OT comments  Ms Kahane was seen for OT treatment on this date. Upon arrival to room pt reclined in chair, agreeable to tx. Pt requires SUPERVISION + RW for ADL transfer including carrying items and opening doors. SETUP for tooth brushing standing sink side. Pt continues to be unable to state posterior hip pcns but good recall of proper transfer technique. Pt making good progress toward goals, will continue to follow POC. Discharge recommendation remains appropriate.       If plan is discharge home, recommend the following:  A little help with bathing/dressing/bathroom;Supervision due to cognitive status;Help with stairs or ramp for entrance   Equipment Recommendations  None recommended by OT    Recommendations for Other Services      Precautions / Restrictions Precautions Precautions: Posterior Hip;Fall Restrictions Weight Bearing Restrictions: Yes RLE Weight Bearing: Weight bearing as tolerated       Mobility Bed Mobility Overal bed mobility: Needs Assistance Bed Mobility: Sit to Supine       Sit to supine: Contact guard assist        Transfers Overall transfer level: Needs assistance Equipment used: Rolling walker (2 wheels) Transfers: Sit to/from Stand Sit to Stand: Supervision                 Balance Overall balance assessment: Needs assistance Sitting-balance support: Feet supported Sitting balance-Leahy  Scale: Good     Standing balance support: No upper extremity supported, During functional activity Standing balance-Leahy Scale: Good                             ADL either performed or assessed with clinical judgement   ADL Overall ADL's : Needs assistance/impaired                                       General ADL Comments: SUPERVISION + RW toilet t/f and tooth brushing standing sink side.      Cognition Arousal: Alert Behavior During Therapy: WFL for tasks assessed/performed Overall Cognitive Status: History of cognitive impairments - at baseline                                 General Comments: still unable to state posterior hip pcns but good recall of proper transfer technique                   Pertinent Vitals/ Pain       Pain Assessment Pain Assessment: Faces Faces Pain Scale: Hurts a little bit Pain Location: LBP Pain Descriptors / Indicators: Discomfort, Sore Pain Intervention(s): Limited activity within patient's tolerance, Repositioned   Frequency  Min 1X/week        Progress Toward Goals  OT Goals(current goals can now be found in  the care plan section)  Progress towards OT goals: Progressing toward goals  Acute Rehab OT Goals Patient Stated Goal: to go home OT Goal Formulation: With patient Time For Goal Achievement: 06/06/23 Potential to Achieve Goals: Good ADL Goals Pt Will Perform Grooming: standing;with modified independence Pt Will Perform Lower Body Dressing: with modified independence;sit to/from stand Pt Will Transfer to Toilet: with modified independence;ambulating;regular height toilet  Plan      Co-evaluation                 AM-PAC OT "6 Clicks" Daily Activity     Outcome Measure   Help from another person eating meals?: None Help from another person taking care of personal grooming?: A Little Help from another person toileting, which includes using toliet, bedpan, or  urinal?: A Little Help from another person bathing (including washing, rinsing, drying)?: A Little Help from another person to put on and taking off regular upper body clothing?: None Help from another person to put on and taking off regular lower body clothing?: A Lot 6 Click Score: 19    End of Session Equipment Utilized During Treatment: Rolling walker (2 wheels)  OT Visit Diagnosis: Unsteadiness on feet (R26.81);Muscle weakness (generalized) (M62.81)   Activity Tolerance Patient tolerated treatment well   Patient Left in bed;with bed alarm set;with call bell/phone within reach   Nurse Communication          Time: 1478-2956 OT Time Calculation (min): 14 min  Charges: OT General Charges $OT Visit: 1 Visit OT Treatments $Self Care/Home Management : 8-22 mins  Kathie Dike, M.S. OTR/L  05/24/23, 2:49 PM  ascom [phone]

## 2023-05-24 NOTE — Progress Notes (Signed)
Physical Therapy Treatment Patient Details Name: Wanda Monroe MRN: [id number] DOB: [birth year] Today's Date: 05/24/2023   History of Present Illness Pt is an 82 y.o. female presenting to hospital 05/20/23 s/p fall at home.  Imaging showing R femoral neck fx.  Pt also noted with hypertensive urgency.  Pt s/p R hip unipolar hemiarthroplasty 05/21/23 d/t R hip displaced femoral neck fx.  Per notes pt is a hospice pt (for terminal diagnosis of respiratory failure).  PMH includes DM, htn, gout, TIA, temporal arthritis, anemia, L foot neuropathy, back sx.    PT Comments  Pt was sitting in recliner upon arrival. She is A and O x 4 and agreeable to PT session." I hope I get to go home today." Pt does endorse pain however states in low back versus hip. She demonstrates safe abilities to stand and ambulate with RW. Pt tolerated gait ~ 200 ft and performance of stairs without physical assist. Recommend RW prior to DC with continued skilled PT to maximize safe functional mobility, transfers, and gait.    If plan is discharge home, recommend the following: A little help with walking and/or transfers;A little help with bathing/dressing/bathroom;Assistance with cooking/housework;Assist for transportation;Help with stairs or ramp for entrance     Equipment Recommendations  Rolling walker (2 wheels)       Precautions / Restrictions Precautions Precautions: Posterior Hip;Fall Precaution Booklet Issued: Yes (comment) Restrictions Weight Bearing Restrictions: Yes RLE Weight Bearing: Weight bearing as tolerated     Mobility  Bed Mobility  General bed mobility comments: pt was in recliner pre/post session. endorsed getting up to BR for BM earlier with RN staff    Transfers Overall transfer level: Needs assistance Equipment used: Rolling walker (2 wheels) Transfers: Sit to/from Stand Sit to Stand: Supervision   Ambulation/Gait Ambulation/Gait assistance: Supervision Gait Distance (Feet): 200  Feet Assistive device: Rolling walker (2 wheels) Gait Pattern/deviations: Step-through pattern, Trunk flexed Gait velocity: decreased  General Gait Details: Pt tolerated increased gait today without LOB or safety concerns. safely performed stairs with supervision as well.   Stairs Stairs: Yes Stairs assistance: Supervision Stair Management: Two rails, Step to pattern, Forwards Number of Stairs: 4     Balance Overall balance assessment: Needs assistance Sitting-balance support: Feet supported Sitting balance-Leahy Scale: Good     Standing balance support: During functional activity, Bilateral upper extremity supported Standing balance-Leahy Scale: Good Standing balance comment: steady ambulating with RW use      Cognition Arousal: Alert Behavior During Therapy: WFL for tasks assessed/performed Overall Cognitive Status: History of cognitive impairments - at baseline  General Comments: A&Ox4           General Comments General comments (skin integrity, edema, etc.): reviewed home HEP. pt states she has been performing      Pertinent Vitals/Pain Pain Assessment Pain Assessment: 0-10 Pain Score: 3  Faces Pain Scale: Hurts a little bit Pain Location: LBP Pain Descriptors / Indicators: Discomfort, Sore Pain Intervention(s): Limited activity within patient's tolerance, Monitored during session, Premedicated before session, Repositioned     PT Goals (current goals can now be found in the care plan section) Acute Rehab PT Goals Patient Stated Goal: go home Progress towards PT goals: Progressing toward goals    Frequency    BID       AM-PAC PT "6 Clicks" Mobility   Outcome Measure  Help needed turning from your back to your side while in a flat bed without using bedrails?: A Little Help needed moving from lying on  your back to sitting on the side of a flat bed without using bedrails?: A Little Help needed moving to and from a bed to a chair (including a  wheelchair)?: A Little Help needed standing up from a chair using your arms (e.g., wheelchair or bedside chair)?: A Little Help needed to walk in hospital room?: A Little Help needed climbing 3-5 steps with a railing? : A Little 6 Click Score: 18    End of Session   Activity Tolerance: Patient tolerated treatment well Patient left: in chair;with call bell/phone within reach;with chair alarm set Nurse Communication: Mobility status PT Visit Diagnosis: Unsteadiness on feet (R26.81);Muscle weakness (generalized) (M62.81);History of falling (Z91.81);Other abnormalities of gait and mobility (R26.89);Pain Pain - Right/Left: Right Pain - part of body: Hip     Time: 5284-1324 PT Time Calculation (min) (ACUTE ONLY): 32 min  Charges:    $Gait Training: 23-37 mins PT General Charges $$ ACUTE PT VISIT: 1 Visit                     Jetta Lout PTA 05/24/23, 9:22 AM

## 2023-05-24 NOTE — Care Management Important Message (Signed)
Important Message  Patient Details  Name: Wanda Monroe MRN: [id number] Date of Birth: 1941-09-12   Medicare Important Message Given:  Yes     [name] A Rebie Peale 05/24/2023, 2:54 PM

## 2023-05-24 NOTE — Discharge Instructions (Signed)

## 2023-05-24 NOTE — Progress Notes (Signed)
Subjective: 3 Days Post-Op Procedure(s) (LRB): ARTHROPLASTY BIPOLAR HIP (HEMIARTHROPLASTY) (Right) Patient reports pain as mild in the right hip this morning.   Patient is well but does report worsening lower back pain after sleeping most of the night in the chair. PT and care management to assist with discharge planning.  She was on hospice prior to recent admission.  Hope is to return home with hospice care and HHPT. Negative for chest pain and shortness of breath Fever: no Gastrointestinal:Negative for nausea and vomiting Denies any urinary symptoms this AM.  Objective: Vital signs in last 24 hours: Temp:  [98.2 F (36.8 C)-99.1 F (37.3 C)] 99.1 F (37.3 C) (09/10 2343) Pulse Rate:  [79-83] 83 (09/10 2343) Resp:  [14-16] 14 (09/10 2343) BP: (154-164)/(65-70) 154/67 (09/10 2343) SpO2:  [96 %-100 %] 96 % (09/10 2343)  Intake/Output from previous day:  Intake/Output Summary (Last 24 hours) at 05/24/2023 0742 Last data filed at 05/24/2023 0520 Gross per 24 hour  Intake --  Output 600 ml  Net -600 ml    Intake/Output this shift: No intake/output data recorded.  Labs: Recent Labs    05/22/23 0509 05/23/23 0546 05/24/23 0612  HGB 8.7* 8.7* 10.0*   Recent Labs    05/23/23 0546 05/24/23 0612  WBC 9.0 9.8  RBC 3.37* 3.89  HCT 28.5* 33.2*  PLT 322 362   Recent Labs    05/22/23 0509  NA 139  K 4.2  CL 106  CO2 25  BUN 17  CREATININE 0.75  GLUCOSE 167*  CALCIUM 8.4*   No results for input(s): "LABPT", "INR" in the last 72 hours.   EXAM General - Patient is Alert and Appropriate Extremity - ABD soft Neurovascular intact Dorsiflexion/Plantar flexion intact Incision: scant drainage No cellulitis present Compartment soft Dressing/Incision - Honeycomb dressing to the right hip intact with minimal bloody drainage noted. Motor Function - intact, moving foot and toes well on exam.  Abdomen soft with intact bowel sounds this AM.  Past Medical History:   Diagnosis Date   Anemia    Arthritis    rhuematoid,OSTEOARTHRITIS   Cellulitis    Diabetes mellitus without complication (HCC)    type II   GERD (gastroesophageal reflux disease)    Gout    Hiatal hernia    Hyperlipidemia    Hypertension    Neuromuscular disorder (HCC)    left foot neuropathy   Osteoporosis    Rosacea    Spinal stenosis of lumbar region    Stroke (HCC)    x2, 9/10   Temporal arteritis (HCC)    Wears dentures    full top, partial bottom    Assessment/Plan: 3 Days Post-Op Procedure(s) (LRB): ARTHROPLASTY BIPOLAR HIP (HEMIARTHROPLASTY) (Right) Principal Problem:   Closed right hip fracture (HCC) Active Problems:   Depression   Dyslipidemia   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   Weight as of this encounter: 60.7 kg. Advance diet Up with therapy D/C IV fluids when tolerating po intake.  Labs and vitals reviewed this AM.  WBC 9.8 this morning. Hg up to 10.0 this AM. Does have worsened chronic low back pain, no urinary symptoms.  99.1 temp  last night. Up with therapy today, she was on hospice prior to admission per the patient. She hopes to return home with hospice care. Continue to work on a BM.  Following discharge, follow-up with [facility] in 10-14 days for staple removal. Continue Lovenox 40mg  daily for 14 days for DVT prevention. Will discharge with short supply of Norco, limit narcotics with history of confusion POD1 likely due to medication.  DVT Prophylaxis - Lovenox and TED hose Weight-Bearing as tolerated to right leg  J. Horris Latino, PA-C [facility] 05/24/2023, 7:42 AM

## 2023-05-24 NOTE — Discharge Summary (Signed)
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)  Hospital Course HPI on admission 05/20/23 PM: "Wanda Monroe is a 82 y.o. Caucasian female with medical history significant for type diabetes mellitus, GERD, hypertension, gout, dyslipidemia, osteoporosis, TIA and temporal arthritis who presented to the emergency room with acute onset of accidental mechanical fall with subsequent right hip pain.  She was having a bowel movement and was getting off of the commode when she lost her balance.  No head injuries.  She denies any presyncope or syncope. ..."   ED course - BP 186/81, RR 21 otherwise normal vitals.  BMP was unremarkable.  CBC showed chronic anemia with Hbg 10.3 (from 11.1 in July 2023).  UA negative for infection.   Chest xray negative. Trauma imaging including CT's of head, C-spine and Right Hip revealed an acute right femoral neck fracture.    Patient was seen by orthopedic surgeon and underwent surgical repair.  PT OT saw patient and we recommended skilled nursing facility placement for patient however she declined and opted to go home with home health.    Consultants: Orthopedics Procedures performed: As mentioned above Disposition: Home health Diet recommendation:  Discharge Diet Orders (From admission, onward)     Start     Ordered   05/24/23 0000  Diet - low sodium heart healthy        05/24/23 1344           Cardiac  diet DISCHARGE MEDICATION: Allergies as of 05/24/2023       Reactions   Codeine Sulfate Nausea Only   Remicade [infliximab] Other (See Comments)   Infection        Medication List     STOP taking these medications    LORazepam 0.5 MG tablet Commonly known as: ATIVAN   Potassium Chloride ER 20 MEQ Tbcr   potassium chloride SA 20 MEQ tablet Commonly known as: KLOR-CON M   traMADol 50 MG tablet Commonly known as: ULTRAM       TAKE these medications    acetaminophen 650 MG CR tablet Commonly known as: TYLENOL Take 650 mg by mouth every 8 (eight) hours.   apixaban 2.5 MG Tabs tablet Commonly known as: Eliquis Take 1 tablet (2.5 mg total) by mouth 2 (two) times daily.   aspirin 81 MG tablet Take 81 mg by mouth daily. AM Notes to patient: Not given in hospital   atorvastatin 80 MG tablet Commonly known as: LIPITOR Take 80 mg by mouth daily. PM   bisacodyl 10 MG suppository Commonly known as: DULCOLAX Place 1 suppository (10 mg total) rectally daily as needed for moderate constipation.   calcium citrate-vitamin D 315-200 MG-UNIT tablet Commonly known as: CITRACAL+D Take 1 tablet by mouth daily.   cyanocobalamin 1000 MCG tablet Commonly known as: VITAMIN B12 Take 1,000 mcg by mouth daily.   docusate sodium 100 MG capsule Commonly known as: COLACE Take 1 capsule (100 mg total) by mouth 2 (two) times daily.   DULoxetine 30 MG  capsule Commonly known as: CYMBALTA Take 30 mg by mouth daily.   ferrous sulfate 325 (65 FE) MG tablet Take 1 tablet (325 mg total) by mouth daily with breakfast. Start taking on: May 25, 2023   folic acid 1 MG tablet Commonly known as: FOLVITE Take 1 mg by mouth daily.   gabapentin 100 MG capsule Commonly known as: NEURONTIN Take 200 mg by mouth 2 (two) times daily.   hydrochlorothiazide 25 MG tablet Commonly known as: HYDRODIURIL Take 25 mg by mouth daily.   HYDROcodone-acetaminophen 5-325 MG tablet Commonly known  as: NORCO/VICODIN Take 1 tablet by mouth every 6 (six) hours as needed for moderate pain.   ipratropium-albuterol 0.5-2.5 (3) MG/3ML Soln Commonly known as: DUONEB Take 3 mLs by nebulization every 6 (six) hours as needed (breathing).   latanoprost 0.005 % ophthalmic solution Commonly known as: XALATAN Place 1 drop into both eyes at bedtime.   metFORMIN 500 MG tablet Commonly known as: GLUCOPHAGE Take 500 mg by mouth 2 (two) times daily with a meal.   methotrexate 2.5 MG tablet Commonly known as: RHEUMATREX Take 12.5 mg by mouth every Thursday.   mirtazapine 15 MG tablet Commonly known as: REMERON Take 15 mg by mouth at bedtime.   ondansetron 4 MG tablet Commonly known as: ZOFRAN Take 1 tablet (4 mg total) by mouth every 6 (six) hours as needed for nausea.   oxyCODONE 5 MG immediate release tablet Commonly known as: Oxy IR/ROXICODONE Take 5 mg by mouth 2 (two) times daily as needed for severe pain or moderate pain.   pantoprazole 40 MG tablet Commonly known as: PROTONIX Take 1 tablet (40 mg total) by mouth daily.   sennosides-docusate sodium 8.6-50 MG tablet Commonly known as: SENOKOT-S Take 1 tablet by mouth 2 (two) times daily.   sertraline 50 MG tablet Commonly known as: ZOLOFT Take 50 mg by mouth at bedtime.   spironolactone 25 MG tablet Commonly known as: ALDACTONE Take 25 mg by mouth daily.   timolol 0.5 % ophthalmic solution Commonly known as: TIMOPTIC Place 1 drop into both eyes 2 (two) times daily.   tiZANidine 4 MG tablet Commonly known as: ZANAFLEX Take 4 mg by mouth 3 (three) times daily as needed for muscle spasms.   vitamin D3 25 MCG tablet Commonly known as: CHOLECALCIFEROL Take 1 tablet (1,000 Units total) by mouth daily.        Follow-up Information     Anson Oregon, PA-C .   Specialty: Physician Assistant Contact information: [address] [place] [address] [phone]         Alinda Dooms, NP .    Specialty: Nurse Practitioner Contact information: [address] [place] [address] [phone]                Discharge Exam: Ceasar Mons Weights   05/20/23 2132  Weight: 60.7 kg   General exam: awake, alert, no acute distress HEENT: moist mucus membranes, hearing grossly normal  Respiratory system: on room air, normal respiratory effort. CTAB no wheezes or rhonchi Cardiovascular system: RRR, no pedal edema.   Gastrointestinal system: soft, NT, ND Central nervous system: A&O x 3. no gross focal neurologic deficits, normal speech Extremities: right hip dressing in place clean dry intact, no edema, normal tone Skin: dry, intact, normal temperature Psychiatry: normal mood, congruent affect  Condition at discharge: good     Discharge time spent:  36 minutes.  Signed: Loyce Dys, MD [facility] 05/24/2023

## 2023-05-24 NOTE — [place] FL2 (Signed)
[place] MEDICAID FL2 LEVEL OF CARE FORM     IDENTIFICATION  Patient Name: Wanda Monroe Birthdate: 25-Sep-1940 Sex: female Admission Date (Current Location): 05/20/2023  [place] and [place] Number:  Chiropodist and Address:  [facility], [address], [place], [place] [address]      Provider Number: [id number]  Attending Physician Name and Address:  Loyce Dys, MD  Relative Name and Phone Number:  Casimiro Needle nephew [phone]    Current Level of Care: Hospital Recommended Level of Care: Skilled Nursing Facility Prior Approval Number:    Date Approved/Denied:   PASRR Number: [id number]  Discharge Plan: SNF    Current Diagnoses: Patient Active Problem List   Diagnosis Date Noted   Acute postoperative anemia due to expected blood loss 05/22/2023   GERD without esophagitis 05/21/2023   Hypertensive urgency 05/21/2023   Hospice care patient 05/21/2023   Closed right hip fracture (HCC) 05/20/2023   Dyslipidemia 05/20/2023   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) 05/20/2023   Rheumatoid arthritis (HCC) 05/20/2023   Aspiration pneumonia (HCC) 04/11/2022   Protein calorie malnutrition (HCC) 04/11/2022   Depression 04/11/2022   Cellulitis of foot 06/16/2021   Colon polyp 06/16/2021   Hiatal hernia with GERD 06/16/2021   Hemorrhagic cerebrovascular accident (CVA) (HCC) 06/16/2021   Osteoarthritis 06/16/2021   Osteoporosis, 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/16/2021   Rheumatoid arthritis involving multiple sites with positive rheumatoid factor (HCC) 06/16/2021   Rosacea 06/16/2021   Nonrheumatic tricuspid valve regurgitation 04/22/2021   Symptomatic anemia 04/10/2014   S/P lumbar laminectomy 06/12/2013   Lumbar stenosis 04/18/2013   Hypertension 07/23/2012   Type II diabetes mellitus with complication (HCC) 07/23/2012   Hyperlipidemia 01/03/2012   Stroke (HCC) 01/03/2012   Temporal arteritis (HCC) 01/03/2012     Orientation RESPIRATION BLADDER Height & Weight     Self, Time, Situation, Place  Normal Continent Weight: 60.7 kg Height:  5\' 7"  (170.2 cm)  BEHAVIORAL SYMPTOMS/MOOD NEUROLOGICAL BOWEL NUTRITION STATUS        Diet (see DC summary)  AMBULATORY STATUS COMMUNICATION OF NEEDS Skin   Limited Assist Verbally Normal                       Personal Care Assistance Level of Assistance  Bathing, Feeding, Dressing Bathing Assistance: Limited assistance Feeding assistance: Limited assistance Dressing Assistance: Limited assistance     Functional Limitations Info  Sight, Hearing, Speech Sight Info: Adequate Hearing Info: Adequate Speech Info: Adequate    SPECIAL CARE FACTORS FREQUENCY  PT (By licensed PT), OT (By licensed OT)     PT Frequency: 5 times per week OT Frequency: 5 times per week            Contractures Contractures Info: Present    Additional Factors Info                  Current Medications (05/24/2023):  This is the current hospital active medication list Current Facility-Administered Medications  Medication Dose Route Frequency Provider Last Rate Last Admin   acetaminophen (TYLENOL) tablet 325-650 mg  325-650 mg Oral Q6H PRN Poggi, Excell Seltzer, MD   650 mg at 05/24/23 0040   atorvastatin (LIPITOR) tablet 80 mg  80 mg Oral q1800 Christena Flake, MD   80 mg at 05/23/23 1752   bisacodyl (DULCOLAX) suppository 10 mg  10 mg Rectal Daily PRN Poggi, Excell Seltzer, MD       calcium citrate (CALCITRATE -  dosed in mg elemental calcium) tablet 200 mg of elemental calcium  1 tablet Oral Daily Poggi, Excell Seltzer, MD   200 mg of elemental calcium at 05/24/23 1009   cholecalciferol (VITAMIN D3) 25 MCG (1000 UNIT) tablet 1,000 Units  1,000 Units Oral Daily Poggi, Excell Seltzer, MD   1,000 Units at 05/24/23 1006   cyanocobalamin (VITAMIN B12) tablet 1,000 mcg  1,000 mcg Oral Daily Poggi, Excell Seltzer, MD   1,000 mcg at 05/24/23 1005   diphenhydrAMINE (BENADRYL) 12.5 MG/5ML elixir 12.5-25 mg   12.5-25 mg Oral Q4H PRN Poggi, Excell Seltzer, MD       docusate sodium (COLACE) capsule 100 mg  100 mg Oral BID Poggi, Excell Seltzer, MD   100 mg at 05/24/23 1005   DULoxetine (CYMBALTA) DR capsule 30 mg  30 mg Oral Daily Poggi, Excell Seltzer, MD   30 mg at 05/24/23 1006   enoxaparin (LOVENOX) injection 40 mg  40 mg Subcutaneous Q24H Poggi, Excell Seltzer, MD   40 mg at 05/24/23 6578   feeding supplement (ENSURE ENLIVE / ENSURE PLUS) liquid 237 mL  237 mL Oral BID BM Esaw Grandchild A, DO   237 mL at 05/23/23 0958   ferrous sulfate tablet 325 mg  325 mg Oral Q breakfast Esaw Grandchild A, DO   325 mg at 05/24/23 4696   folic acid (FOLVITE) tablet 1 mg  1 mg Oral Daily Poggi, Excell Seltzer, MD   1 mg at 05/24/23 1006   gabapentin (NEURONTIN) capsule 200 mg  200 mg Oral BID Christena Flake, MD   200 mg at 05/24/23 1006   hydrochlorothiazide (HYDRODIURIL) tablet 25 mg  25 mg Oral Daily Poggi, Excell Seltzer, MD   25 mg at 05/24/23 1006   HYDROcodone-acetaminophen (NORCO/VICODIN) 5-325 MG per tablet 1-2 tablet  1-2 tablet Oral Q6H PRN Poggi, Excell Seltzer, MD   2 tablet at 05/24/23 2952   labetalol (NORMODYNE) injection 20 mg  20 mg Intravenous Q3H PRN Poggi, Excell Seltzer, MD       latanoprost (XALATAN) 0.005 % ophthalmic solution 1 drop  1 drop Both Eyes QHS Poggi, Excell Seltzer, MD       magnesium hydroxide (MILK OF MAGNESIA) suspension 30 mL  30 mL Oral Daily PRN Poggi, Excell Seltzer, MD       metoCLOPramide (REGLAN) tablet 5-10 mg  5-10 mg Oral Q8H PRN Poggi, Excell Seltzer, MD       Or   metoCLOPramide (REGLAN) injection 5-10 mg  5-10 mg Intravenous Q8H PRN Poggi, Excell Seltzer, MD       mirtazapine (REMERON) tablet 15 mg  15 mg Oral QHS Poggi, Excell Seltzer, MD   15 mg at 05/23/23 2139   morphine (PF) 2 MG/ML injection 0.5 mg  0.5 mg Intravenous Q2H PRN Poggi, Excell Seltzer, MD       multivitamin with minerals tablet 1 tablet  1 tablet Oral Daily Esaw Grandchild A, DO   1 tablet at 05/24/23 1006   ondansetron (ZOFRAN) tablet 4 mg  4 mg Oral Q6H PRN Poggi, Excell Seltzer, MD       Or   ondansetron  (ZOFRAN) injection 4 mg  4 mg Intravenous Q6H PRN Poggi, Excell Seltzer, MD       pantoprazole (PROTONIX) EC tablet 40 mg  40 mg Oral Daily Poggi, Excell Seltzer, MD   40 mg at 05/24/23 1006   potassium chloride SA (KLOR-CON M) CR tablet 20 mEq  20 mEq Oral BID Poggi, Excell Seltzer,  MD   20 mEq at 05/24/23 1005   sertraline (ZOLOFT) tablet 50 mg  50 mg Oral QHS Poggi, Excell Seltzer, MD   50 mg at 05/23/23 2138   sodium phosphate (FLEET) enema 1 enema  1 enema Rectal Once PRN Poggi, Excell Seltzer, MD       spironolactone (ALDACTONE) tablet 25 mg  25 mg Oral Daily Poggi, Excell Seltzer, MD   25 mg at 05/24/23 1006   timolol (TIMOPTIC) 0.5 % ophthalmic solution 1 drop  1 drop Both Eyes BID Poggi, Excell Seltzer, MD   1 drop at 05/23/23 9528   tiZANidine (ZANAFLEX) tablet 4 mg  4 mg Oral TID PRN Christena Flake, MD   4 mg at 05/22/23 0537   traZODone (DESYREL) tablet 25 mg  25 mg Oral QHS PRN Poggi, Excell Seltzer, MD   25 mg at 05/21/23 2307     Discharge Medications: Please see discharge summary for a list of discharge medications.  Relevant Imaging Results:  Relevant Lab Results:   Additional Information SS# [id number]  Marlowe Sax, RN

## 2023-05-24 NOTE — Plan of Care (Signed)

## 2023-05-25 DIAGNOSIS — S72001A Fracture of unspecified part of neck of right femur, initial encounter for closed fracture: Secondary | ICD-10-CM | POA: Diagnosis not present

## 2023-05-25 DIAGNOSIS — W19XXXA Unspecified fall, initial encounter: Secondary | ICD-10-CM | POA: Diagnosis not present

## 2023-05-25 MED ORDER — FAMOTIDINE 20 MG PO TABS
20.0000 mg | ORAL_TABLET | Freq: Every day | ORAL | Status: DC
  Administered 2023-05-25: 20 mg via ORAL
  Filled 2023-05-25: qty 1

## 2023-05-25 NOTE — Progress Notes (Signed)
[facility] hospitalized hospice patient visit  [name] is a current hospice patient followed at home for terminal diagnosis of respiratory failure. Patient suffered a fall on 9.7.24 and was sent to the ED by hospice nurse for further examination and testing. Patient was admitted 9.7.24 with right hip fracture. Patient had right hemiarthroplasty on 9.8.24 Per Dr. Gordy Savers, hospice physician, this is a related hospital admission.   Visited with patient in room, she is up in chair and reports she is having a good deal of pain today across her lower back. Discussed current discharge planning and patient reports that it is still her plan to return home. She reports she has friends that can help her along with her nephew and neighbor. She reports her plan is to return home today likely.   Addendum: After consultation with family patient has decided she does think skilled rehab will be important prior to going home.   Patient remains inpatient appropriate as she recovers from right hip surgery.   Vital Signs: 98.6/99/18    157/77    98% room air I&O: 360/not recorded Abnormal labs: Hgb 10, Hct 33.2 Diagnostics: none new  IV/PRN Meds: Vicodin 5/325 x1  Problem List: Closed right hip fracture (HCC) --Mgmt per Orthopedic Surgery --Underwent Right hip unipolar hemiarthroplasty on 05/21/23 with Dr. Joice Lofts --Pain control PRN --Bowel regimen --PT/OT evaluations post-op --Ortho recommends: ----WBAT on right leg ----Lovenox, TED hose for DVT ppx     Acute postoperative anemia due to expected blood loss Iron deficiency anemia Hbg trend 10.3 >> 9.3 >> 8.7>> 8.7 Iron studies show deficiency --Consider iron infusion if deficient --Monitor CBC --Start iron supplement   Hypertensive urgency Hx of Hypertension BP on initially 186/81, later 195/78, suspect due to pain from hip fracture. BP's more controlled, stable --Continue hydrochlorothiazide, spironolactone --IV  labetalol PRN     Discharge Planning: Ongoing, as of end of day patient has decided on SNF Family Contact: N/A patient is her own spokesperson IDT: Updated Goals of Care: DNR  Please don't hesitate to reach out for any hospice related questions or concerns.  Thea Gist, BSN RN Hospice hospital liaison [phone]

## 2023-05-25 NOTE — TOC Progression Note (Signed)
Transition of Care [facility]) - Progression Note    Patient Details  Name: Wanda Monroe MRN: [id number] Date of Birth: November 29, 1940  Transition [facility] Care [facility]) CM/SW Contact  Marlowe Sax, RN Phone Number: 05/25/2023, 12:06 PM  Clinical Narrative:    Spoke with the patient and reviewed the bed offers, she chose [facility], I [facility], they are checking with her business office to ensure she can DC today, she will be regular medicare til the end of Sept since she revoked Hospice today   Expected Discharge Plan: Home w Hospice Care (PT ANd OT with Hospice) Barriers to Discharge: No Barriers Identified  Expected Discharge Plan and Services   Discharge Planning Services: CM Consult   Living arrangements for the past 2 months: Single Family Home (nephew stay with her at night) Expected Discharge Date: 05/24/23                 DME Agency: NA       HH Arranged: Refused SNF           Social Determinants of Health (SDOH) Interventions SDOH Screenings   Food Insecurity: No Food Insecurity (05/21/2023)  Housing: Low Risk  (05/21/2023)  Transportation Needs: No Transportation Needs (05/21/2023)  Utilities: Not At Risk (05/21/2023)  Tobacco Use: Low Risk  (05/20/2023)    Readmission Risk Interventions     No data to display

## 2023-05-25 NOTE — Plan of Care (Signed)
  Problem: Education: Goal: Knowledge of General Education information will improve Description: Including pain rating scale, medication(s)/side effects and non-pharmacologic comfort measures Outcome: Progressing   Problem: Health Behavior/Discharge Planning: Goal: Ability to manage health-related needs will improve Outcome: Progressing   Problem: Clinical Measurements: Goal: Ability to maintain clinical measurements within normal limits will improve Outcome: Progressing Goal: Will remain free from infection Outcome: Progressing Goal: Diagnostic test results will improve Outcome: Progressing   Problem: Activity: Goal: Risk for activity intolerance will decrease Outcome: Progressing   Problem: Nutrition: Goal: Adequate nutrition will be maintained Outcome: Progressing   Problem: Elimination: Goal: Will not experience complications related to bowel motility Outcome: Progressing Goal: Will not experience complications related to urinary retention Outcome: Progressing   Problem: Pain Managment: Goal: General experience of comfort will improve Outcome: Progressing   Problem: Safety: Goal: Ability to remain free from injury will improve Outcome: Progressing   Problem: Skin Integrity: Goal: Risk for impaired skin integrity will decrease Outcome: Progressing   

## 2023-05-25 NOTE — Plan of Care (Signed)

## 2023-05-25 NOTE — Progress Notes (Signed)
[facility]  Hospice hospital note  Patient elected to revoke hospice services in order to seek skilled rehab.   Outpatient palliative will follow in facility.   Please don't hesitate to reach out for any hospice related questions or concerns.  Thea Gist, BSN [facility] liaison  [phone]

## 2023-05-25 NOTE — Discharge Summary (Signed)
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 Caucasian female with medical history significant for type diabetes mellitus, GERD, hypertension, gout, dyslipidemia, osteoporosis, TIA and temporal arthritis who presented to the emergency room with acute onset of accidental mechanical fall with subsequent right hip pain.  She was having a bowel movement and was getting off of the commode when she lost her balance.  No head injuries.  She subsequently sustained a right hip fracture and therefore presented to the emergency room for further management.   ED course - BP 186/81, RR 21 otherwise normal vitals.  BMP was unremarkable.  CBC showed chronic anemia with Hbg 10.3 (from 11.1 in July 2023).  UA negative for infection.   Chest xray negative. Trauma imaging including CT's of head, C-spine and Right Hip revealed an acute right femoral neck fracture.    Patient was seen by orthopedic surgeon and underwent surgical repair.  PT OT saw patient and recommended skilled nursing facility placement.  Patient is now stable for discharge today and to follow-up with orthopedics as an outpatient.     Consultants: Orthopedics Procedures performed: As mentioned above Disposition: Home health Diet recommendation:  Discharge Diet Orders (From admission, onward)     Start     Ordered    05/24/23 0000  Diet - low sodium heart healthy        05/24/23 1344           Cardiac diet DISCHARGE MEDICATION: Allergies as of 05/25/2023       Reactions   Codeine Sulfate Nausea Only   Remicade [infliximab] Other (See Comments)   Infection        Medication List     STOP taking these medications    LORazepam 0.5 MG tablet Commonly known as: ATIVAN   Potassium Chloride ER 20 MEQ Tbcr   potassium chloride SA 20 MEQ tablet Commonly known as: KLOR-CON M   traMADol 50 MG tablet Commonly known as: ULTRAM       TAKE these medications    acetaminophen 650 MG CR tablet Commonly known as: TYLENOL Take 650 mg by mouth every 8 (eight) hours.   apixaban 2.5 MG Tabs tablet Commonly known as: Eliquis Take 1 tablet (2.5 mg total) by mouth 2 (two) times daily.   aspirin 81 MG tablet Take 81 mg by mouth daily. AM Notes to patient: Not given in hospital   atorvastatin 80 MG tablet Commonly known as: LIPITOR Take 80 mg by mouth daily. PM   bisacodyl 10 MG suppository Commonly known as: DULCOLAX Place 1 suppository (10 mg total) rectally daily as needed for moderate constipation.   calcium citrate-vitamin D 315-200 MG-UNIT tablet Commonly known as: CITRACAL+D Take 1 tablet by mouth daily.   cyanocobalamin 1000 MCG tablet Commonly known as: VITAMIN B12 Take 1,000 mcg by mouth daily.   docusate sodium 100 MG capsule Commonly known as: COLACE Take 1  capsule (100 mg total) by mouth 2 (two) times daily.   DULoxetine 30 MG capsule Commonly known as: CYMBALTA Take 30 mg by mouth daily.   ferrous sulfate 325 (65 FE) MG tablet Take 1 tablet (325 mg total) by mouth daily with breakfast.   folic acid 1 MG tablet Commonly known as: FOLVITE Take 1 mg by mouth daily.   gabapentin 100 MG capsule Commonly known as: NEURONTIN Take 200 mg by mouth 2 (two) times daily.   hydrochlorothiazide 25 MG tablet Commonly known as: HYDRODIURIL Take 25 mg by mouth daily.    HYDROcodone-acetaminophen 5-325 MG tablet Commonly known as: NORCO/VICODIN Take 1 tablet by mouth every 6 (six) hours as needed for moderate pain.   ipratropium-albuterol 0.5-2.5 (3) MG/3ML Soln Commonly known as: DUONEB Take 3 mLs by nebulization every 6 (six) hours as needed (breathing).   latanoprost 0.005 % ophthalmic solution Commonly known as: XALATAN Place 1 drop into both eyes at bedtime.   metFORMIN 500 MG tablet Commonly known as: GLUCOPHAGE Take 500 mg by mouth 2 (two) times daily with a meal.   methotrexate 2.5 MG tablet Commonly known as: RHEUMATREX Take 12.5 mg by mouth every Thursday.   mirtazapine 15 MG tablet Commonly known as: REMERON Take 15 mg by mouth at bedtime.   ondansetron 4 MG tablet Commonly known as: ZOFRAN Take 1 tablet (4 mg total) by mouth every 6 (six) hours as needed for nausea.   oxyCODONE 5 MG immediate release tablet Commonly known as: Oxy IR/ROXICODONE Take 5 mg by mouth 2 (two) times daily as needed for severe pain or moderate pain.   pantoprazole 40 MG tablet Commonly known as: PROTONIX Take 1 tablet (40 mg total) by mouth daily.   sennosides-docusate sodium 8.6-50 MG tablet Commonly known as: SENOKOT-S Take 1 tablet by mouth 2 (two) times daily.   sertraline 50 MG tablet Commonly known as: ZOLOFT Take 50 mg by mouth at bedtime.   spironolactone 25 MG tablet Commonly known as: ALDACTONE Take 25 mg by mouth daily.   timolol 0.5 % ophthalmic solution Commonly known as: TIMOPTIC Place 1 drop into both eyes 2 (two) times daily.   tiZANidine 4 MG tablet Commonly known as: ZANAFLEX Take 4 mg by mouth 3 (three) times daily as needed for muscle spasms.   vitamin D3 25 MCG tablet Commonly known as: CHOLECALCIFEROL Take 1 tablet (1,000 Units total) by mouth daily.        Contact information for follow-up providers     Anson Oregon, PA-C .   Specialty: Physician Assistant Contact information: [address]  [address] [place] [address] [phone]         Alinda Dooms, NP .   Specialty: Nurse Practitioner Contact information: [address] [place] [address] [phone]              Contact information for after-discharge care     Destination     [facility] Preferred SNF .   Service: Skilled Nursing Contact information: [address] [place] [address] [phone]                    Discharge Exam: Ceasar Mons Weights   05/20/23 2132  Weight: 60.7 kg   General exam: awake, alert, no acute distress HEENT: moist mucus membranes, hearing grossly normal  Respiratory system: on room air, normal respiratory effort. CTAB no wheezes or rhonchi Cardiovascular system: RRR, no pedal edema.   Gastrointestinal system:  soft, NT, ND Central nervous system: A&O x 3. no gross focal neurologic deficits, normal speech Extremities: right hip dressing in place clean dry intact, no edema, normal tone Skin: dry, intact, normal temperature Psychiatry: normal mood, congruent affect  Condition at discharge: good   Discharge time spent:  35 minutes.  Signed: Loyce Dys, MD [facility] 05/25/2023

## 2023-05-25 NOTE — Progress Notes (Addendum)
Physical Therapy Treatment Patient Details Name: Wanda Monroe MRN: [id number] DOB: April 07, 1941 Today's Date: 05/25/2023   History of Present Illness Pt is an 82 y.o. female presenting to hospital 05/20/23 s/p fall at home.  Imaging showing R femoral neck fx.  Pt also noted with hypertensive urgency.  Pt s/p R hip unipolar hemiarthroplasty 05/21/23 d/t R hip displaced femoral neck fx.  Per notes pt is a hospice pt (for terminal diagnosis of respiratory failure).  PMH includes DM, htn, gout, TIA, temporal arthritis, anemia, L foot neuropathy, back sx.    PT Comments  Pt was supine in bed upon arrival. She endorses severe nausea throughout session. RN aware and contacting MD. Pt was still agreeable to session and OOB activity. Overall tolerated session well. Pt states she does not have 24/7 assistance at home and feels she will need rehab at DC. Acute PT will continue to follow and progress per current POC.    If plan is discharge home, recommend the following: A little help with walking and/or transfers;A little help with bathing/dressing/bathroom;Assistance with cooking/housework;Assist for transportation;Help with stairs or ramp for entrance     Equipment Recommendations  Rolling walker (2 wheels)       Precautions / Restrictions Precautions Precautions: Posterior Hip;Fall Precaution Booklet Issued: Yes (comment) Restrictions Weight Bearing Restrictions: Yes RLE Weight Bearing: Weight bearing as tolerated     Mobility  Bed Mobility Overal bed mobility: Needs Assistance Bed Mobility: Supine to Sit, Sit to Supine  Supine to sit: Supervision     Transfers Overall transfer level: Needs assistance Equipment used: Rolling walker (2 wheels) Transfers: Sit to/from Stand Sit to Stand: Contact guard assist   Ambulation/Gait Ambulation/Gait assistance: Supervision Gait Distance (Feet): 120 Feet Assistive device: Rolling walker (2 wheels) Gait Pattern/deviations: Step-through pattern, Trunk  flexed Gait velocity: decreased  General Gait Details: no LOB. Poor heel strike>to toe off but stable. Elevated RW to endorse improved posture.   Balance Overall balance assessment: Needs assistance Sitting-balance support: Feet supported Sitting balance-Leahy Scale: Good     Standing balance support: No upper extremity supported, During functional activity Standing balance-Leahy Scale: Good       Cognition Arousal: Alert Behavior During Therapy: WFL for tasks assessed/performed Overall Cognitive Status: History of cognitive impairments - at baseline    General Comments: Pt A and O but does endorse being nausous throughout session.               Pertinent Vitals/Pain Pain Assessment Pain Assessment: 0-10 Pain Score: 2  Pain Location: hip pain Pain Descriptors / Indicators: Discomfort, Sore Pain Intervention(s): Limited activity within patient's tolerance, Monitored during session     PT Goals (current goals can now be found in the care plan section) Acute Rehab PT Goals Patient Stated Goal: go home Progress towards PT goals: Progressing toward goals    Frequency    BID       AM-PAC PT "6 Clicks" Mobility   Outcome Measure  Help needed turning from your back to your side while in a flat bed without using bedrails?: A Little Help needed moving from lying on your back to sitting on the side of a flat bed without using bedrails?: A Little Help needed moving to and from a bed to a chair (including a wheelchair)?: A Little Help needed standing up from a chair using your arms (e.g., wheelchair or bedside chair)?: A Little Help needed to walk in hospital room?: A Little Help needed climbing 3-5 steps with a railing? :  A Little 6 Click Score: 18    End of Session   Activity Tolerance: Patient tolerated treatment well Patient left: in chair;with call bell/phone within reach;with chair alarm set Nurse Communication: Mobility status PT Visit Diagnosis: Unsteadiness  on feet (R26.81);Muscle weakness (generalized) (M62.81);History of falling (Z91.81);Other abnormalities of gait and mobility (R26.89);Pain Pain - Right/Left: Right Pain - part of body: Hip     Time: 4098-1191 PT Time Calculation (min) (ACUTE ONLY): 24 min  Charges:    $Gait Training: 8-22 mins $Therapeutic Activity: 8-22 mins PT General Charges $$ ACUTE PT VISIT: 1 Visit                     Jetta Lout PTA 05/25/23, 9:48 AM

## 2023-05-25 NOTE — TOC Progression Note (Signed)
Transition of Care [facility]) - Progression Note    Patient Details  Name: Wanda Monroe MRN: [id number] Date of Birth: [birth year]  Transition of Care [facility]) CM/SW Contact  Marlowe Sax, RN Phone Number: 05/25/2023, 1:17 PM  Clinical Narrative:    [name] and let him know that she will go to [facility] room 809 with EMS transport, Called EMS to arrange transport she is next on the list   Expected Discharge Plan: Home w Hospice Care (PT ANd OT with Hospice) Barriers to Discharge: No Barriers Identified  Expected Discharge Plan and Services   Discharge Planning Services: CM Consult   Living arrangements for the past 2 months: Single Family Home (nephew stay with her at night) Expected Discharge Date: 05/25/23                 DME Agency: NA       HH Arranged: Refused SNF           Social Determinants of Health (SDOH) Interventions SDOH Screenings   Food Insecurity: No Food Insecurity (05/21/2023)  Housing: Low Risk  (05/21/2023)  Transportation Needs: No Transportation Needs (05/21/2023)  Utilities: Not At Risk (05/21/2023)  Tobacco Use: Low Risk  (05/20/2023)    Readmission Risk Interventions     No data to display

## 2023-05-25 NOTE — TOC Progression Note (Signed)
Transition of Care [facility]) - Progression Note    Patient Details  Name: Wanda Monroe MRN: [id number] Date of Birth: 08-17-1941  Transition of Care [facility]) CM/SW Contact  Marlowe Sax, RN Phone Number: 05/25/2023, 9:09 AM  Clinical Narrative:     Late note, the patient's friend Wanda Monroe came to transport the patient and then they decided she would go to short term rehab I notified Annice Pih with [facility] that she wants to go to rehab and will need to revoke her hospice Bedsearch was completed and Fl2 complete, PASSR obtained, will review bed offers once obtained  Expected Discharge Plan: Home w Hospice Care (PT ANd OT with Hospice) Barriers to Discharge: No Barriers Identified  Expected Discharge Plan and Services   Discharge Planning Services: CM Consult   Living arrangements for the past 2 months: Single Family Home (nephew stay with her at night) Expected Discharge Date: 05/24/23                 DME Agency: NA       HH Arranged: Refused SNF           Social Determinants of Health (SDOH) Interventions SDOH Screenings   Food Insecurity: No Food Insecurity (05/21/2023)  Housing: Low Risk  (05/21/2023)  Transportation Needs: No Transportation Needs (05/21/2023)  Utilities: Not At Risk (05/21/2023)  Tobacco Use: Low Risk  (05/20/2023)    Readmission Risk Interventions     No data to display

## 2023-06-01 ENCOUNTER — Inpatient Hospital Stay: Payer: Medicare Other | Attending: Nurse Practitioner | Admitting: Nurse Practitioner

## 2023-06-01 ENCOUNTER — Inpatient Hospital Stay: Payer: Medicare Other

## 2023-06-01 ENCOUNTER — Encounter: Payer: Self-pay | Admitting: Nurse Practitioner

## 2023-06-01 VITALS — BP 160/74 | HR 91 | Temp 97.8°F

## 2023-06-01 DIAGNOSIS — E876 Hypokalemia: Secondary | ICD-10-CM | POA: Diagnosis not present

## 2023-06-01 DIAGNOSIS — R7989 Other specified abnormal findings of blood chemistry: Secondary | ICD-10-CM | POA: Insufficient documentation

## 2023-06-01 DIAGNOSIS — D649 Anemia, unspecified: Secondary | ICD-10-CM | POA: Insufficient documentation

## 2023-06-01 DIAGNOSIS — D509 Iron deficiency anemia, unspecified: Secondary | ICD-10-CM

## 2023-06-01 LAB — COMPREHENSIVE METABOLIC PANEL
ALT: 17 U/L (ref 0–44)
AST: 26 U/L (ref 15–41)
Albumin: 4.2 g/dL (ref 3.5–5.0)
Alkaline Phosphatase: 126 U/L (ref 38–126)
Anion gap: 15 (ref 5–15)
BUN: 41 mg/dL — ABNORMAL HIGH (ref 8–23)
CO2: 27 mmol/L (ref 22–32)
Calcium: 9.8 mg/dL (ref 8.9–10.3)
Chloride: 95 mmol/L — ABNORMAL LOW (ref 98–111)
Creatinine, Ser: 1.06 mg/dL — ABNORMAL HIGH (ref 0.44–1.00)
GFR, Estimated: 53 mL/min — ABNORMAL LOW (ref >60)
Glucose, Bld: 122 mg/dL — ABNORMAL HIGH (ref 70–99)
Potassium: 3.3 mmol/L — ABNORMAL LOW (ref 3.5–5.1)
Sodium: 137 mmol/L (ref 135–145)
Total Bilirubin: 0.7 mg/dL (ref 0.3–1.2)
Total Protein: 8.3 g/dL — ABNORMAL HIGH (ref 6.5–8.1)

## 2023-06-01 LAB — IRON AND TIBC
Iron: 63 ug/dL (ref 28–170)
Saturation Ratios: 14 % (ref 10.4–31.8)
TIBC: 452 ug/dL — ABNORMAL HIGH (ref 250–450)
UIBC: 389 ug/dL

## 2023-06-01 LAB — CBC WITH DIFFERENTIAL/PLATELET
Abs Immature Granulocytes: 0.13 10*3/uL — ABNORMAL HIGH (ref 0.00–0.07)
Basophils Absolute: 0 10*3/uL (ref 0.0–0.1)
Basophils Relative: 0 %
Eosinophils Absolute: 0 10*3/uL (ref 0.0–0.5)
Eosinophils Relative: 0 %
HCT: 37.9 % (ref 36.0–46.0)
Hemoglobin: 11.6 g/dL — ABNORMAL LOW (ref 12.0–15.0)
Immature Granulocytes: 1 %
Lymphocytes Relative: 11 %
Lymphs Abs: 1.6 10*3/uL (ref 0.7–4.0)
MCH: 25.9 pg — ABNORMAL LOW (ref 26.0–34.0)
MCHC: 30.6 g/dL (ref 30.0–36.0)
MCV: 84.6 fL (ref 80.0–100.0)
Monocytes Absolute: 0.8 10*3/uL (ref 0.1–1.0)
Monocytes Relative: 5 %
Neutro Abs: 12 10*3/uL — ABNORMAL HIGH (ref 1.7–7.7)
Neutrophils Relative %: 83 %
Platelets: 783 10*3/uL — ABNORMAL HIGH (ref 150–400)
RBC: 4.48 MIL/uL (ref 3.87–5.11)
RDW: 16.2 % — ABNORMAL HIGH (ref 11.5–15.5)
WBC: 14.6 10*3/uL — ABNORMAL HIGH (ref 4.0–10.5)
nRBC: 0 % (ref 0.0–0.2)

## 2023-06-01 LAB — FERRITIN: Ferritin: 60 ng/mL (ref 11–307)

## 2023-06-01 NOTE — Progress Notes (Signed)
[facility] Progress Note  Patient Care Team: Pcp, No as PCP - General Earna Coder, MD as Consulting Physician (Hematology and Oncology) Leafy Ro, MD as Consulting Physician (General Surgery)  CHIEF COMPLAINTS/PURPOSE OF CONSULTATION: ANEMIA  HEMATOLOGY HISTORY:  # ANEMIA EGD-; colonoscopy- ; capsule-? Bone marrow Biopsy-  HISTORY OF PRESENTING ILLNESS: In a wheelchair.  Accompanied by/neighbor/friend. Patient has been recently evaluated by pulmonary for ongoing shortness of breath.  Subsequently referred to surgery for evaluation treatment of paraesophageal hernia.  Patient underwent EGD.   Blood in stools: None Change in bowel habits- None Blood in urine: None Difficulty swallowing: yes- hiatal hernia Abnormal weight loss: yes Iron supplementation: none currently Prior Blood transfusions: none Bariatric surgery: None EGD: 10/ 20- Dr.Sakai. /Colonoscopy:>> may years ago.  Vaginal bleeding: None   Interval History:  Wanda Monroe 82 y.o. female with PMH significant for chronic anemia, T2DM, GERD, HPTN, Gout, dyslipidemia, Osteoporosis, TIA, temporal arthritis, who presented to the ER with mechanical fall with subsequent right hip pain after rising from toilet and falling. Imaging consistent with acute right femoral neck fracture. She underwent surgical repair. She was discharged to rehab where she continues to recover. Her son plans to help her transition home soon.   Review of Systems  Constitutional:  Positive for malaise/fatigue. Negative for chills, diaphoresis, fever and weight loss.  HENT:  Negative for nosebleeds and sore throat.   Respiratory:  Positive for shortness of breath. Negative for cough, hemoptysis, sputum production and wheezing.   Cardiovascular:  Negative for chest pain, palpitations, orthopnea and leg swelling.  Gastrointestinal:  Positive for melena. Negative for abdominal pain, blood in stool, constipation, diarrhea, heartburn,  nausea and vomiting.  Genitourinary:  Negative for dysuria, frequency and urgency.  Musculoskeletal:  Positive for falls and joint pain. Negative for back pain.  Skin: Negative.  Negative for itching and rash.  Neurological:  Negative for dizziness, tingling, focal weakness, weakness and headaches.  Endo/Heme/Allergies:  Does not bruise/bleed easily.  Psychiatric/Behavioral:  Negative for depression. The patient is not nervous/anxious and does not have insomnia.     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   Temporal arteritis (HCC)    Wears dentures    full top, partial bottom    SURGICAL HISTORY: Past Surgical History:  Procedure Laterality Date   ABDOMINAL HYSTERECTOMY     APPENDECTOMY     BACK SURGERY     lumbar and cervical laminectomies   BREAST BIOPSY Left 1960,1980   twice negative results   CAPSULOTOMY METATARSOPHALANGEAL Left 01/22/2015   Procedure: CAPSULOTOMY METATARSOPHALANGEAL;  Surgeon: Recardo Evangelist, DPM;  Location: [facility];  Service: Podiatry;  Laterality: Left;   CATARACT EXTRACTION W/PHACO Left 10/19/2016   Procedure: CATARACT EXTRACTION PHACO AND INTRAOCULAR LENS PLACEMENT (IOC)  Left Eye  Diabetic;  Surgeon: Lockie Mola, MD;  Location: [facility];  Service: Ophthalmology;  Laterality: Left;  Diabetic - oral meds Left Eye   CATARACT EXTRACTION W/PHACO  Right 11/16/2016   Procedure: CATARACT EXTRACTION PHACO AND INTRAOCULAR LENS PLACEMENT (IOC) dIABETIC;  Surgeon: Lockie Mola, MD;  Location: [facility];  Service: Ophthalmology;   Laterality: Right;  DIABETES - oral meds   COLONOSCOPY WITH PROPOFOL N/A 04/24/2018   Procedure: COLONOSCOPY WITH PROPOFOL;  Surgeon: Toledo, Boykin Nearing, MD;  Location: [facility];  Service: Gastroenterology;  Laterality: N/A;   ESOPHAGOGASTRODUODENOSCOPY N/A 07/01/2021   Procedure: ESOPHAGOGASTRODUODENOSCOPY (EGD);  Surgeon: Sung Amabile, DO;  Location: [facility];  Service: General;  Laterality: N/A;   ESOPHAGOGASTRODUODENOSCOPY (EGD) WITH PROPOFOL N/A 04/24/2018   Procedure: ESOPHAGOGASTRODUODENOSCOPY (EGD) WITH PROPOFOL;  Surgeon: Toledo, Boykin Nearing, MD;  Location: [facility];  Service: Gastroenterology;  Laterality: N/A;   FOOT SURGERY     HALLUX VALGUS AKIN Left 01/22/2015   Procedure: HALLUX VALGUS AKIN;  Surgeon: Recardo Evangelist, DPM;  Location: [facility];  Service: Podiatry;  Laterality: Left;  left great toe   HEMORRHOID SURGERY     HIP ARTHROPLASTY Right 05/21/2023   Procedure: ARTHROPLASTY BIPOLAR HIP (HEMIARTHROPLASTY);  Surgeon: Christena Flake, MD;  Location: [facility];  Service: Orthopedics;  Laterality: Right;   KNEE ARTHROSCOPY     LUMBAR LAMINECTOMY  04/23/13   NOSE SURGERY     TONSILLECTOMY     URETER SURGERY      SOCIAL HISTORY: Social History   Socioeconomic History   Marital status: Widowed    Spouse name: Not on file   Number of children: Not on file   Years of education: Not on file   Highest education level: Not on file  Occupational History   Not on file  Tobacco Use   Smoking status: Never   Smokeless tobacco: Never  Vaping Use   Vaping status: Never Used  Substance and Sexual Activity   Alcohol use: No   Drug use: Not Currently   Sexual activity: Not on file  Other Topics Concern   Not on file  Social History Narrative   Not on file   Social Determinants of Health   Financial Resource Strain: Not on file  Food Insecurity: No Food Insecurity (05/21/2023)   Hunger Vital Sign    Worried About Running Out of Food in the Last Year:  Never true    Ran Out of Food in the Last Year: Never true  Transportation Needs: No Transportation Needs (05/21/2023)   PRAPARE - Administrator, Civil Service (Medical): No    Lack of Transportation (Non-Medical): No  Physical Activity: Not on file  Stress: Not on file  Social Connections: Not on file  Intimate Partner Violence: Not At Risk (05/21/2023)   Humiliation, Afraid, Rape, and Kick questionnaire    Fear of Current or Ex-Partner: No    Emotionally Abused: No    Physically Abused: No    Sexually Abused: No    FAMILY HISTORY: Family History  Problem Relation Age of Onset   Diabetes Father    Stroke Father    Breast cancer Neg Hx     ALLERGIES:  is allergic to codeine sulfate and remicade [infliximab].  MEDICATIONS:  Current Outpatient Medications  Medication Sig Dispense Refill   acetaminophen (TYLENOL) 650 MG CR tablet Take 650 mg by mouth every 8 (eight) hours.     apixaban (ELIQUIS) 2.5 MG TABS tablet Take 1 tablet (2.5 mg total) by mouth 2 (two) times daily. 28 tablet 0   aspirin 81 MG tablet Take 81 mg by mouth daily. AM     atorvastatin (  LIPITOR) 80 MG tablet Take 80 mg by mouth daily. PM     bisacodyl (DULCOLAX) 10 MG suppository Place 1 suppository (10 mg total) rectally daily as needed for moderate constipation. 12 suppository 0   calcium citrate-vitamin D (CITRACAL+D) 315-200 MG-UNIT per tablet Take 1 tablet by mouth daily.     cholecalciferol (CHOLECALCIFEROL) 25 MCG tablet Take 1 tablet (1,000 Units total) by mouth daily. 30 tablet 0   docusate sodium (COLACE) 100 MG capsule Take 1 capsule (100 mg total) by mouth 2 (two) times daily. 10 capsule 0   DULoxetine (CYMBALTA) 30 MG capsule Take 30 mg by mouth daily.     ferrous sulfate 325 (65 FE) MG tablet Take 1 tablet (325 mg total) by mouth daily with breakfast. 60 tablet 3   folic acid (FOLVITE) 1 MG tablet Take 1 mg by mouth daily.     gabapentin (NEURONTIN) 100 MG capsule Take 200 mg by mouth 2  (two) times daily.     hydrochlorothiazide (HYDRODIURIL) 25 MG tablet Take 25 mg by mouth daily.     HYDROcodone-acetaminophen (NORCO/VICODIN) 5-325 MG tablet Take 1 tablet by mouth every 6 (six) hours as needed for moderate pain. 20 tablet 0   ipratropium-albuterol (DUONEB) 0.5-2.5 (3) MG/3ML SOLN Take 3 mLs by nebulization every 6 (six) hours as needed (breathing).     latanoprost (XALATAN) 0.005 % ophthalmic solution Place 1 drop into both eyes at bedtime.     metFORMIN (GLUCOPHAGE) 500 MG tablet Take 500 mg by mouth 2 (two) times daily with a meal.     mirtazapine (REMERON) 15 MG tablet Take 15 mg by mouth at bedtime.     ondansetron (ZOFRAN) 4 MG tablet Take 1 tablet (4 mg total) by mouth every 6 (six) hours as needed for nausea. 20 tablet 0   oxyCODONE (OXY IR/ROXICODONE) 5 MG immediate release tablet Take 5 mg by mouth 2 (two) times daily as needed for severe pain or moderate pain.     pantoprazole (PROTONIX) 40 MG tablet Take 1 tablet (40 mg total) by mouth daily. 30 tablet 1   sennosides-docusate sodium (SENOKOT-S) 8.6-50 MG tablet Take 1 tablet by mouth 2 (two) times daily.     sertraline (ZOLOFT) 50 MG tablet Take 50 mg by mouth at bedtime.     spironolactone (ALDACTONE) 25 MG tablet Take 25 mg by mouth daily.     timolol (TIMOPTIC) 0.5 % ophthalmic solution Place 1 drop into both eyes 2 (two) times daily.     tiZANidine (ZANAFLEX) 4 MG tablet Take 4 mg by mouth 3 (three) times daily as needed for muscle spasms.     vitamin B-12 (CYANOCOBALAMIN) 1000 MCG tablet Take 1,000 mcg by mouth daily.     methotrexate (RHEUMATREX) 2.5 MG tablet Take 12.5 mg by mouth every Thursday. (Patient not taking: Reported on 05/21/2023)     No current facility-administered medications for this visit.    PHYSICAL EXAMINATION: Vitals:   06/01/23 0958  BP: (!) 160/74  Pulse: 91  Temp: 97.8 F (36.6 C)  SpO2: 99%   There were no vitals filed for this visit.  Physical Exam Vitals reviewed.   Constitutional:      Comments: Thin build. Frail. Accompanied  Eyes:     General: No scleral icterus.    Conjunctiva/sclera: Conjunctivae normal.  Cardiovascular:     Rate and Rhythm: Normal rate and regular rhythm.  Pulmonary:     Effort: Pulmonary effort is normal.  Abdominal:  Palpations: Abdomen is soft.  Musculoskeletal:        General: No deformity or signs of injury.     Right lower leg: No edema.     Left lower leg: No edema.  Skin:    General: Skin is warm.     Coloration: Skin is pale.  Neurological:     Mental Status: She is alert and oriented to person, place, and time.  Psychiatric:        Mood and Affect: Mood normal.        Behavior: Behavior normal.     LABORATORY DATA:  I have reviewed the data as listed Lab Results  Component Value Date   WBC 9.8 05/24/2023   HGB 10.0 (L) 05/24/2023   HCT 33.2 (L) 05/24/2023   MCV 85.3 05/24/2023   PLT 362 05/24/2023   Recent Labs    05/20/23 2137 05/21/23 0526 05/22/23 0509  NA 138 139 139  K 3.9 3.7 4.2  CL 103 103 106  CO2 25 28 25   GLUCOSE 95 119* 167*  BUN 19 18 17   CREATININE 0.92 0.84 0.75  CALCIUM 9.1 8.6* 8.4*  GFRNONAA >60 >60 >60  Iron/TIBC/Ferritin/ %Sat    Component Value Date/Time   IRON 20 (L) 05/23/2023 0546   TIBC 318 05/23/2023 0546   FERRITIN 43 05/23/2023 0546   IRONPCTSAT 6 (L) 05/23/2023 0546    DG HIP UNILAT W OR W/O PELVIS 2-3 VIEWS RIGHT  Result Date: 05/21/2023 CLINICAL DATA:  Right hip hemiarthroplasty. EXAM: DG HIP (WITH OR WITHOUT PELVIS) 2-3V RIGHT COMPARISON:  CT of the right hip 05/20/2023 FINDINGS: Right hip hemiarthroplasty is in place. The arthroplasty is cemented. The hip is located. The visualized pelvis is within normal limits. Atherosclerotic calcifications are noted. IMPRESSION: Right hip hemiarthroplasty without radiographic evidence for complication. Electronically Signed   By: Marin Roberts M.D.   On: 05/21/2023 12:25   DG Hand Complete  Right  Result Date: 05/21/2023 CLINICAL DATA:  Metacarpal bone fracture. EXAM: RIGHT HAND - COMPLETE 3+ VIEW COMPARISON:  None Available. FINDINGS: There is no evidence of fracture or dislocation. There is a fragmentation to versus osteophyte formation at the first interphalangeal joint with associated soft tissue swelling. This finding has chronic appearance. Extensive erosive osteoarthritis, worse at the second and third distal interphalangeal joints. IMPRESSION: 1. No acute fracture or dislocation identified about the right hand. 2. Extensive erosive osteoarthritis, worse at the second and third distal interphalangeal joints. Electronically Signed   By: Ted Mcalpine M.D.   On: 05/21/2023 12:25   CT HEAD WO CONTRAST ( )  Result Date: 05/21/2023 CLINICAL DATA:  Larey Seat from standing, head and neck trauma. EXAM: CT HEAD WITHOUT CONTRAST CT CERVICAL SPINE WITHOUT CONTRAST TECHNIQUE: Multidetector CT imaging of the head and cervical spine was performed following the standard protocol without intravenous contrast. Multiplanar CT image reconstructions of the cervical spine were also generated. RADIATION DOSE REDUCTION: This exam was performed according to the departmental dose-optimization program which includes automated exposure control, adjustment of the mA and/or kV according to patient size and/or use of iterative reconstruction technique. COMPARISON:  MRI brain 08/29/2009, with no prior cross-sectional imaging or films of the cervical spine. Limited comparison available with CTA chest 04/11/2022. FINDINGS: CT HEAD FINDINGS Brain: There is chronic right posterior parieto-occipital encephalomalacia corresponding to the distribution of the previously acute infarct. There is additional wedge-shaped chronic infarct which was also previously acute, in the posteroinferior right cerebellar hemisphere and additional small scattered chronic bilateral cerebellar  lacunar infarcts. There is mild cerebellar atrophy  also increased as well as progressive moderate cerebral atrophy, small-vessel disease and atrophic ventriculomegaly. No asymmetry is seen worrisome for an acute cortical based infarct, hemorrhage, mass or mass effect and there is no midline shift. Basal cisterns are clear. Vascular: Moderate patchy calcific plaque both siphons. No hyperdense central vessels. Skull: Negative for fractures or focal lesions. There is calvarial osteopenia. There is no visible scalp hematoma. Sinuses/Orbits: Negative orbits except for interval bilateral lens replacements. Clear sinuses and mastoids. Midline nasal septum. Other: None. CT CERVICAL SPINE FINDINGS Alignment: There is a trace retrolisthesis C3-4, C4-5 and C5-6, believed to be discogenic in etiology. No traumatic listhesis is suspected or other misalignment. Narrowing and spurring of the anterior atlantodental joint is noted, with the C1 lateral masses normally positioned on C2. Skull base and vertebrae: There is osteopenia. No acute fracture is evident. No primary bone lesion or focal pathologic process. There is mild anterior wedging of the C7, T1, and T2 vertebral bodies, but this was seen on the prior CTA chest unchanged. The other cervical vertebrae are normal in Incidentally noted is bone-on-bone degenerative arthrosis and mild remodeling of both TMJs. The patient is edentulous except for the low anteriors. Soft tissues and spinal canal: No prevertebral fluid or swelling. No visible canal hematoma. There are moderately heavy calcifications of the carotid bifurcations. No laryngeal mass. There is a 9 mm nodule in the right lobe of the thyroid gland. No follow-up imaging is recommended. Disc levels: There is mild disc space loss at C3-4 at C4-5. The other cervical discs are normal in heights. No herniated disc or cord compromise are seen. There is mild cervical spondylosis but no significant posterior disc osteophyte complex. Mild features of facet arthrosis are present  but do not result in bony foraminal compromise. Upper chest: Negative. Other: None. IMPRESSION: 1. No acute intracranial CT findings or depressed skull fractures. 2. Chronic right posterior parieto-occipital encephalomalacia, and additional chronic infarct in the dorsal inferior right cerebellar hemisphere with bilateral cerebellar lacunae. 3. Progressive atrophy, small-vessel disease, and atrophic ventriculomegaly since 2010. 4. Osteopenia and degenerative change without evidence of cervical fractures or traumatic listhesis. 5. Mild chronic wedging of the C7, T1, and T2 vertebral bodies. 6. Carotid atherosclerosis. 7. 9 mm right thyroid nodule.  No follow-up imaging is recommended. Electronically Signed   By: Almira Bar M.D.   On: 05/21/2023 00:07   CT Cervical Spine Wo Contrast  Result Date: 05/21/2023 CLINICAL DATA:  Larey Seat from standing, head and neck trauma. EXAM: CT HEAD WITHOUT CONTRAST CT CERVICAL SPINE WITHOUT CONTRAST TECHNIQUE: Multidetector CT imaging of the head and cervical spine was performed following the standard protocol without intravenous contrast. Multiplanar CT image reconstructions of the cervical spine were also generated. RADIATION DOSE REDUCTION: This exam was performed according to the departmental dose-optimization program which includes automated exposure control, adjustment of the mA and/or kV according to patient size and/or use of iterative reconstruction technique. COMPARISON:  MRI brain 08/29/2009, with no prior cross-sectional imaging or films of the cervical spine. Limited comparison available with CTA chest 04/11/2022. FINDINGS: CT HEAD FINDINGS Brain: There is chronic right posterior parieto-occipital encephalomalacia corresponding to the distribution of the previously acute infarct. There is additional wedge-shaped chronic infarct which was also previously acute, in the posteroinferior right cerebellar hemisphere and additional small scattered chronic bilateral cerebellar  lacunar infarcts. There is mild cerebellar atrophy also increased as well as progressive moderate cerebral atrophy, small-vessel disease and atrophic ventriculomegaly. No  asymmetry is seen worrisome for an acute cortical based infarct, hemorrhage, mass or mass effect and there is no midline shift. Basal cisterns are clear. Vascular: Moderate patchy calcific plaque both siphons. No hyperdense central vessels. Skull: Negative for fractures or focal lesions. There is calvarial osteopenia. There is no visible scalp hematoma. Sinuses/Orbits: Negative orbits except for interval bilateral lens replacements. Clear sinuses and mastoids. Midline nasal septum. Other: None. CT CERVICAL SPINE FINDINGS Alignment: There is a trace retrolisthesis C3-4, C4-5 and C5-6, believed to be discogenic in etiology. No traumatic listhesis is suspected or other misalignment. Narrowing and spurring of the anterior atlantodental joint is noted, with the C1 lateral masses normally positioned on C2. Skull base and vertebrae: There is osteopenia. No acute fracture is evident. No primary bone lesion or focal pathologic process. There is mild anterior wedging of the C7, T1, and T2 vertebral bodies, but this was seen on the prior CTA chest unchanged. The other cervical vertebrae are normal in Incidentally noted is bone-on-bone degenerative arthrosis and mild remodeling of both TMJs. The patient is edentulous except for the low anteriors. Soft tissues and spinal canal: No prevertebral fluid or swelling. No visible canal hematoma. There are moderately heavy calcifications of the carotid bifurcations. No laryngeal mass. There is a 9 mm nodule in the right lobe of the thyroid gland. No follow-up imaging is recommended. Disc levels: There is mild disc space loss at C3-4 at C4-5. The other cervical discs are normal in heights. No herniated disc or cord compromise are seen. There is mild cervical spondylosis but no significant posterior disc osteophyte  complex. Mild features of facet arthrosis are present but do not result in bony foraminal compromise. Upper chest: Negative. Other: None. IMPRESSION: 1. No acute intracranial CT findings or depressed skull fractures. 2. Chronic right posterior parieto-occipital encephalomalacia, and additional chronic infarct in the dorsal inferior right cerebellar hemisphere with bilateral cerebellar lacunae. 3. Progressive atrophy, small-vessel disease, and atrophic ventriculomegaly since 2010. 4. Osteopenia and degenerative change without evidence of cervical fractures or traumatic listhesis. 5. Mild chronic wedging of the C7, T1, and T2 vertebral bodies. 6. Carotid atherosclerosis. 7. 9 mm right thyroid nodule.  No follow-up imaging is recommended. Electronically Signed   By: Almira Bar M.D.   On: 05/21/2023 00:07   CT Hip Right Wo Contrast  Result Date: 05/20/2023 CLINICAL DATA:  Trauma EXAM: CT OF THE RIGHT HIP WITHOUT CONTRAST TECHNIQUE: Multidetector CT imaging of the right hip was performed according to the standard protocol. Multiplanar CT image reconstructions were also generated. RADIATION DOSE REDUCTION: This exam was performed according to the departmental dose-optimization program which includes automated exposure control, adjustment of the mA and/or kV according to patient size and/or use of iterative reconstruction technique. COMPARISON:  Right hip x-ray same day FINDINGS: Bones/Joint/Cartilage There is an acute right femoral neck fracture. There is mild apex anterior angulation there is 1/2 shaft with posterior displacement of the distal fracture fragment. There is mild impaction. There are moderate degenerative changes of the right hip with joint space narrowing and osteophyte formation. Ligaments Suboptimally assessed by CT. Muscles and Tendons No focal hematoma or fluid collection. Soft tissues Peripheral vascular calcifications are present. There is a Foley catheter in the bladder. There is sigmoid colon  diverticulosis. IMPRESSION: Acute right femoral neck fracture. Electronically Signed   By: Darliss Cheney M.D.   On: 05/20/2023 23:48   DG Knee 2 Views Right  Result Date: 05/20/2023 CLINICAL DATA:  Fall EXAM: RIGHT KNEE - 1-2  VIEW COMPARISON:  None Available. FINDINGS: No fracture or malalignment. Vascular calcifications. 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 Trace knee effusion. IMPRESSION: 1. No acute osseous abnormality. 2. 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 3. Trace knee effusion. Electronically Signed   By: Jasmine Pang M.D.   On: 05/20/2023 22:35   DG Chest Portable 1 View  Result Date: 05/20/2023 CLINICAL DATA:  Fall EXAM: PORTABLE CHEST 1 VIEW COMPARISON:  CT chest 04/11/2022 FINDINGS: No acute airspace disease or effusion. Normal cardiomediastinal silhouette with aortic atherosclerosis. No pneumothorax. IMPRESSION: No active disease. Electronically Signed   By: Jasmine Pang M.D.   On: 05/20/2023 22:35   DG HIP UNILAT WITH PELVIS 2-3 VIEWS RIGHT  Result Date: 05/20/2023 CLINICAL DATA:  Fall with right-sided hip pain EXAM: DG HIP (WITH OR WITHOUT PELVIS) 2-3V RIGHT COMPARISON:  01/24/2015 FINDINGS: Right femoral head projects in joint. Pubic symphysis and rami appear intact. Findings suspicious for right femoral neck fracture. Advanced vascular calcifications IMPRESSION: Findings suspicious for right femoral neck fracture. Electronically Signed   By: Jasmine Pang M.D.   On: 05/20/2023 22:34     Assessment & Plan:   Wanda Monroe is a 82 y.o. female with history of anemia, who returns to clinic to reestablish care after hospitalization for fall and hip fracture.   Anemia- during hospitalization, hmg worsened to 8.7 post ORIF of hip. Today, hmg 11.6. Ferritin 60. Iron sat 14%. Given recent surgery, I suspect that iron deficiency is contributing and likely etiology. Plan for venofer x  2.  Etiology of Anemia- previously microcytic. Suspicious for iron deficiency however, etiology not clear. She has hiatal hernia which may be contributing. Also reviewed possible blood loss. EGD 07/01/21 with Dr. Tonna Boehringer - medium hiatal hernia; normal otherwise. 04/24/2018- Colonoscopy with Dr. Norma Fredrickson- diverticulosis.  Hypokalemia- K 3.3. Stable. Follow up with PCP.  Falls- previously declined workup and physical therapy.  Elevated Creatinine- Per patient she has limited intake d/t mobility concerns. Encouraged hydration.  Hip fracture- post fall. Acute right femoral neck fracture s/p surgical repair by Dr Joice Lofts on 05/21/23. SNF/Rehab was recommended.  Chronic respiratory failure- was on hospice for this condition previously Goals of care- revoked hospice end of September in view of going to short term rehab post hip fracture  Disposition:  Add lab visit today (cbc, bmp, ferritin, iron studies) Venofer x 2 3 mo- labs (cbc, cmp, ferritin, iron studies), see me, +/- venofer- la  All questions were answered. The patient knows to call the clinic with any problems, questions or concerns.   Alinda Dooms, NP 06/01/2023   CC: Dr. Judithann Sheen

## 2023-06-12 ENCOUNTER — Encounter: Payer: Self-pay | Admitting: Internal Medicine

## 2023-06-19 ENCOUNTER — Ambulatory Visit: Payer: PPO

## 2023-06-22 ENCOUNTER — Encounter: Payer: Self-pay | Admitting: Internal Medicine

## 2023-06-27 ENCOUNTER — Ambulatory Visit: Payer: PPO

## 2023-06-30 ENCOUNTER — Encounter: Payer: Self-pay | Admitting: Internal Medicine

## 2023-07-31 ENCOUNTER — Encounter: Payer: Self-pay | Admitting: Internal Medicine

## 2024-01-11 ENCOUNTER — Emergency Department

## 2024-01-11 ENCOUNTER — Other Ambulatory Visit: Payer: Self-pay

## 2024-01-11 ENCOUNTER — Encounter: Payer: Self-pay | Admitting: Radiology

## 2024-01-11 ENCOUNTER — Emergency Department
Admission: EM | Admit: 2024-01-11 | Discharge: 2024-01-12 | Disposition: A | Discharge status: Home / Self Care | Attending: Emergency Medicine | Admitting: Emergency Medicine

## 2024-01-11 ENCOUNTER — Encounter: Payer: Self-pay | Admitting: Internal Medicine

## 2024-01-11 DIAGNOSIS — K8689 Other specified diseases of pancreas: Secondary | ICD-10-CM | POA: Diagnosis not present

## 2024-01-11 DIAGNOSIS — I700 Atherosclerosis of aorta: Secondary | ICD-10-CM | POA: Diagnosis not present

## 2024-01-11 DIAGNOSIS — K429 Umbilical hernia without obstruction or gangrene: Secondary | ICD-10-CM | POA: Insufficient documentation

## 2024-01-11 DIAGNOSIS — S22080A Wedge compression fracture of T11-T12 vertebra, initial encounter for closed fracture: Secondary | ICD-10-CM | POA: Diagnosis not present

## 2024-01-11 DIAGNOSIS — Z96641 Presence of right artificial hip joint: Secondary | ICD-10-CM | POA: Insufficient documentation

## 2024-01-11 DIAGNOSIS — Z7982 Long term (current) use of aspirin: Secondary | ICD-10-CM | POA: Insufficient documentation

## 2024-01-11 DIAGNOSIS — M545 Low back pain, unspecified: Secondary | ICD-10-CM | POA: Diagnosis present

## 2024-01-11 DIAGNOSIS — Z8673 Personal history of transient ischemic attack (TIA), and cerebral infarction without residual deficits: Secondary | ICD-10-CM | POA: Diagnosis not present

## 2024-01-11 DIAGNOSIS — R519 Headache, unspecified: Secondary | ICD-10-CM | POA: Diagnosis not present

## 2024-01-11 DIAGNOSIS — W19XXXA Unspecified fall, initial encounter: Secondary | ICD-10-CM | POA: Insufficient documentation

## 2024-01-11 DIAGNOSIS — S22000A Wedge compression fracture of unspecified thoracic vertebra, initial encounter for closed fracture: Secondary | ICD-10-CM

## 2024-01-11 DIAGNOSIS — J449 Chronic obstructive pulmonary disease, unspecified: Secondary | ICD-10-CM | POA: Insufficient documentation

## 2024-01-11 LAB — CBC WITH DIFFERENTIAL/PLATELET
Abs Immature Granulocytes: 0.04 10*3/uL (ref 0.00–0.07)
Basophils Absolute: 0 10*3/uL (ref 0.0–0.1)
Basophils Relative: 0 %
Eosinophils Absolute: 0 10*3/uL (ref 0.0–0.5)
Eosinophils Relative: 1 %
HCT: 26.9 % — ABNORMAL LOW (ref 36.0–46.0)
Hemoglobin: 8.3 g/dL — ABNORMAL LOW (ref 12.0–15.0)
Immature Granulocytes: 1 %
Lymphocytes Relative: 13 %
Lymphs Abs: 0.6 10*3/uL — ABNORMAL LOW (ref 0.7–4.0)
MCH: 28.8 pg (ref 26.0–34.0)
MCHC: 30.9 g/dL (ref 30.0–36.0)
MCV: 93.4 fL (ref 80.0–100.0)
Monocytes Absolute: 0.4 10*3/uL (ref 0.1–1.0)
Monocytes Relative: 9 %
Neutro Abs: 3.8 10*3/uL (ref 1.7–7.7)
Neutrophils Relative %: 76 %
Platelets: 339 10*3/uL (ref 150–400)
RBC: 2.88 MIL/uL — ABNORMAL LOW (ref 3.87–5.11)
RDW: 20.8 % — ABNORMAL HIGH (ref 11.5–15.5)
WBC: 4.9 10*3/uL (ref 4.0–10.5)
nRBC: 0 % (ref 0.0–0.2)

## 2024-01-11 LAB — COMPREHENSIVE METABOLIC PANEL WITH GFR
ALT: 9 U/L (ref 0–44)
AST: 16 U/L (ref 15–41)
Albumin: 3.2 g/dL — ABNORMAL LOW (ref 3.5–5.0)
Alkaline Phosphatase: 74 U/L (ref 38–126)
Anion gap: 5 (ref 5–15)
BUN: 20 mg/dL (ref 8–23)
CO2: 27 mmol/L (ref 22–32)
Calcium: 9.4 mg/dL (ref 8.9–10.3)
Chloride: 103 mmol/L (ref 98–111)
Creatinine, Ser: 1.04 mg/dL — ABNORMAL HIGH (ref 0.44–1.00)
GFR, Estimated: 54 mL/min — ABNORMAL LOW (ref >60)
Glucose, Bld: 123 mg/dL — ABNORMAL HIGH (ref 70–99)
Potassium: 4.2 mmol/L (ref 3.5–5.1)
Sodium: 135 mmol/L (ref 135–145)
Total Bilirubin: 0.7 mg/dL (ref 0.0–1.2)
Total Protein: 6.6 g/dL (ref 6.5–8.1)

## 2024-01-11 LAB — TROPONIN I (HIGH SENSITIVITY)
Troponin I (High Sensitivity): 5 ng/L (ref <18)
Troponin I (High Sensitivity): 4 ng/L (ref <18)

## 2024-01-11 LAB — CK: Total CK: 57 U/L (ref 38–234)

## 2024-01-11 MED ORDER — ONDANSETRON HCL 4 MG/2ML IJ SOLN
4.0000 mg | Freq: Once | INTRAMUSCULAR | Status: AC
  Administered 2024-01-11: 4 mg via INTRAVENOUS
  Filled 2024-01-11: qty 2

## 2024-01-11 MED ORDER — OXYCODONE HCL 5 MG PO TABS
5.0000 mg | ORAL_TABLET | Freq: Once | ORAL | Status: AC
  Administered 2024-01-11: 5 mg via ORAL
  Filled 2024-01-11: qty 1

## 2024-01-11 MED ORDER — HYDROMORPHONE HCL 1 MG/ML IJ SOLN
0.5000 mg | Freq: Once | INTRAMUSCULAR | Status: AC
  Administered 2024-01-11: 0.5 mg via INTRAVENOUS
  Filled 2024-01-11: qty 0.5

## 2024-01-11 MED ORDER — IOHEXOL 300 MG/ML  SOLN
75.0000 mL | Freq: Once | INTRAMUSCULAR | Status: AC | PRN
  Administered 2024-01-11: 75 mL via INTRAVENOUS

## 2024-01-11 MED ORDER — HYDROMORPHONE HCL 1 MG/ML IJ SOLN: 0.5000 mg | Freq: Once | INTRAMUSCULAR | Status: DC

## 2024-01-11 MED ORDER — LIDOCAINE 5 % EX PTCH
1.0000 | MEDICATED_PATCH | CUTANEOUS | Status: DC
  Administered 2024-01-11: 1 via TRANSDERMAL
  Filled 2024-01-11: qty 1

## 2024-01-11 NOTE — ED Provider Notes (Addendum)
 [facility] Provider Note    Event Date/Time   First MD Initiated Contact with Patient 01/11/24 1053     (approximate)   History   Hip Pain (Pt had a glf x 3 days ago resulting in L hip pain and lower back pain. Denies hitting head or LOC. Takes aspirin . Has been mobile since but it is painful to move. No external abnormalities noted. Pulses intact.)   HPI  Wanda Monroe is a 83 y.o. female with history of COPD on aspirin  who is currently on hospice who comes in with concern for a fall.  Patient reports having a fall hurting her right hip and low back.  She has been able to ambulate but with much more difficulty secondary to pain.  She reports the pain is more in the low back.  On review of records patient was seen on 05/20/2019 for for a fall and had a hip fracture on the right.  Patient unclear exactly why she fell unclear if she hurt her head or any other injuries.  Her biggest concern is low back pain and right hip pain.  She was given fentanyl  with EMS but still having significant pain.  Patient got 50 of IV fentanyl  and then 25 and another 3 with EMS  Physical Exam   Triage Vital Signs: ED Triage Vitals  Encounter Vitals Group     BP 01/11/24 1056 (!) 167/67     Systolic BP Percentile --      Diastolic BP Percentile --      Pulse Rate 01/11/24 1056 87     Resp 01/11/24 1056 16     Temp 01/11/24 1056 99.9 F (37.7 C)     Temp Source 01/11/24 1056 Oral     SpO2 01/11/24 1056 100 %     Weight 01/11/24 1102 129 lb 4.8 oz (58.7 kg)     Height 01/11/24 1102 5\' 6"  (1.676 m)     Head Circumference --      Peak Flow --      Pain Score 01/11/24 1052 8     Pain Loc --      Pain Education --      Exclude from Growth Chart --     Most recent vital signs: Vitals:   01/11/24 1056  BP: (!) 167/67  Pulse: 87  Resp: 16  Temp: 99.9 F (37.7 C)  SpO2: 100%     General: Awake, no distress.  CV:  Good peripheral perfusion.  Resp:  Normal effort.   Abd:  No distention.  Other:  Tender on the right hip and right low back.   ED Results / Procedures / Treatments   Labs (all labs ordered are listed, but only abnormal results are displayed) Labs Reviewed  CBC WITH DIFFERENTIAL/PLATELET  COMPREHENSIVE METABOLIC PANEL WITH GFR  CK  TROPONIN I (HIGH SENSITIVITY)     EKG  My interpretation of EKG:  Normal sinus rhythm EKG without any ST elevation or T wave inversions, normal intervals  RADIOLOGY I have reviewed the xray personally and interpreted no obvious hip fracture    PROCEDURES:  Critical Care performed: No  .1-3 Lead EKG Interpretation  Performed by: Lubertha Rush, MD Authorized by: Lubertha Rush, MD     Interpretation: normal     ECG rate:  80   ECG rate assessment: normal     Rhythm: sinus rhythm     Ectopy: none     Conduction: normal  MEDICATIONS ORDERED IN ED: Medications  HYDROmorphone  (DILAUDID ) injection 0.5 mg (0.5 mg Intravenous Given 01/11/24 1138)  ondansetron  (ZOFRAN ) injection 4 mg (4 mg Intravenous Given 01/11/24 1139)  iohexol  (OMNIPAQUE ) 300 MG/ML solution 75 mL (75 mLs Intravenous Contrast Given 01/11/24 1314)     IMPRESSION / MDM / ASSESSMENT AND PLAN / ED COURSE  I reviewed the triage vital signs and the nursing notes.   Patient's presentation is most consistent with acute presentation with potential threat to life or bodily function.   Differential includes fracture, dislocation.  Will get labs to evaluate for any evidence of AKI, rhabdo or other acute pathology.  Discussed with patient about CT imaging and patient did want a proceed with Cts Patient given some IV Dilaudid  and Zofran  for pain CBC shows hemoglobin of 8.3 down from baseline of 11.6.  Given she does have flank pain.  Will get CT imaging to ensure that there is no signs of any retroperitoneal bleed although no bruising noted.   Repeat evaluation patient still having significant pain even after Dilaudid .  We would  looked at her back I do not see any obvious bruising but her pain seems more paraspinal although she does have some lumbar pain.  No chest wall pain no thoracic pain.  Will proceed with CT imaging to further evaluate.  Xray were negative  Troponins are negative  x2.  CMP shows stable creatinine  CBC shows slightly low heme  IMPRESSION: 1. Acute mild compression fracture deformity of T12 vertebral body. No significant retropulsion or spinal canal compromise. 2. Probable subtle undisplaced acute fracture of the right posteroinferior corner of T11 vertebral body. No significant loss of vertebral body height. No retropulsion of fractured fragments. 3. Mild-to-moderate multilevel degenerative changes.  Discussed with patient and we discussed admission for pain control, brace.  Patient reports that she is on hospice and she would like to go home. Messaged NS dr Felipe Horton to see if anything other then brace- looks like he is in surgery currently. Will get TLSO brace, oxycodone  and let hospice team know what is going on.  They did not pick up- left message.   Pt handed off penidng brace and ambulatior trial.  Hospice team did state that they were checking on patient to help with medications when she is discharged home.  Dr. Felipe Horton did agree with the plan and will follow her up in clinic outpatient for standing x-rays.  The patient is on the cardiac monitor to evaluate for evidence of arrhythmia and/or significant heart rate changes.      FINAL CLINICAL IMPRESSION(S) / ED DIAGNOSES   Final diagnoses:  Compression fracture of body of thoracic vertebra (HCC)     Rx / DC Orders   ED Discharge Orders     None        Note:  This document was prepared using Dragon voice recognition software and may include unintentional dictation errors.   Lubertha Rush, MD 01/11/24 1440    Lubertha Rush, MD 01/11/24 [phone]

## 2024-01-11 NOTE — Progress Notes (Signed)
 [facility] Liaison note  This patient is a current [facility] patient.  [facility] will follow through discharge disposition.    Please call with any Hospice related questions or concerns.  [facility] Liaison [phone]

## 2024-01-11 NOTE — ED Notes (Signed)
 TLSO representative arrived to place TLSO brace on patient at this time.

## 2024-01-11 NOTE — ED Notes (Signed)
 Called [facility] transport to home after mn

## 2024-01-11 NOTE — Progress Notes (Signed)
 Orthopedic Tech Progress Note Patient Details:  Wanda Monroe 09/12/1941 [id number]  RN called requesting a TLSO BRACE for patient, so I called in order to [employer] for a STAT TLSO BRACE   Patient ID: Wanda Monroe, female   DOB: 1940-09-24, 83 y.o.   MRN: [id number]  [name] 01/11/2024, 6:47 PM

## 2024-01-11 NOTE — Discharge Instructions (Addendum)
 Wear the brace to help with comfort and return to the ER if you have worsening symptoms or any other concerns  IMPRESSION: 1. Acute mild compression fracture deformity of T12 vertebral body. No significant retropulsion or spinal canal compromise. 2. Probable subtle undisplaced acute fracture of the right posteroinferior corner of T11 vertebral body. No significant loss of vertebral body height. No retropulsion of fractured fragments. 3. Mild-to-moderate multilevel degenerative changes.

## 2024-01-11 NOTE — ED Notes (Signed)
 Discharge instructions and teaching completed with patient.  Secretary is attempting to get patient EMS transport back to residence at this time. NADN. VSS. Patient verbalizes understanding.

## 2024-01-11 NOTE — ED Notes (Addendum)
 Applied pt on bedpan and obtained a urine sample if ordered at a later time and placed urine sample on side counter for the nurse to send off if needed. Removed pt from under the slider mat.

## 2024-01-11 NOTE — ED Triage Notes (Addendum)
 Pt had a glf x 3 days ago resulting in R hip pain and lower back pain. Denies hitting head or LOC. Takes aspirin . Has been mobile since but it is painful to move. No external abnormalities noted. Pulses intact.

## 2024-01-11 NOTE — ED Notes (Signed)
 Patients meal tray brought at this time. Patient positioned appropriately on stretcher to be able to eat.

## 2024-01-18 ENCOUNTER — Emergency Department

## 2024-01-18 ENCOUNTER — Observation Stay
Admission: EM | Admit: 2024-01-18 | Discharge: 2024-01-23 | Disposition: A | Discharge status: Home / Self Care | Attending: Obstetrics and Gynecology | Admitting: Obstetrics and Gynecology

## 2024-01-18 ENCOUNTER — Observation Stay

## 2024-01-18 ENCOUNTER — Other Ambulatory Visit: Payer: Self-pay

## 2024-01-18 DIAGNOSIS — S22000A Wedge compression fracture of unspecified thoracic vertebra, initial encounter for closed fracture: Secondary | ICD-10-CM | POA: Diagnosis present

## 2024-01-18 DIAGNOSIS — E119 Type 2 diabetes mellitus without complications: Secondary | ICD-10-CM | POA: Diagnosis present

## 2024-01-18 DIAGNOSIS — W010XXA Fall on same level from slipping, tripping and stumbling without subsequent striking against object, initial encounter: Secondary | ICD-10-CM | POA: Insufficient documentation

## 2024-01-18 DIAGNOSIS — S2221XA Fracture of manubrium, initial encounter for closed fracture: Secondary | ICD-10-CM | POA: Insufficient documentation

## 2024-01-18 DIAGNOSIS — J449 Chronic obstructive pulmonary disease, unspecified: Secondary | ICD-10-CM | POA: Diagnosis not present

## 2024-01-18 DIAGNOSIS — S0240DA Maxillary fracture, left side, initial encounter for closed fracture: Secondary | ICD-10-CM | POA: Insufficient documentation

## 2024-01-18 DIAGNOSIS — M069 Rheumatoid arthritis, unspecified: Secondary | ICD-10-CM | POA: Diagnosis present

## 2024-01-18 DIAGNOSIS — I10 Essential (primary) hypertension: Secondary | ICD-10-CM | POA: Diagnosis present

## 2024-01-18 DIAGNOSIS — I129 Hypertensive chronic kidney disease with stage 1 through stage 4 chronic kidney disease, or unspecified chronic kidney disease: Secondary | ICD-10-CM | POA: Diagnosis not present

## 2024-01-18 DIAGNOSIS — N179 Acute kidney failure, unspecified: Secondary | ICD-10-CM | POA: Insufficient documentation

## 2024-01-18 DIAGNOSIS — I071 Rheumatic tricuspid insufficiency: Secondary | ICD-10-CM | POA: Diagnosis present

## 2024-01-18 DIAGNOSIS — Z8673 Personal history of transient ischemic attack (TIA), and cerebral infarction without residual deficits: Secondary | ICD-10-CM | POA: Insufficient documentation

## 2024-01-18 DIAGNOSIS — S22080A Wedge compression fracture of T11-T12 vertebra, initial encounter for closed fracture: Secondary | ICD-10-CM | POA: Diagnosis not present

## 2024-01-18 DIAGNOSIS — E118 Type 2 diabetes mellitus with unspecified complications: Secondary | ICD-10-CM | POA: Diagnosis not present

## 2024-01-18 DIAGNOSIS — S22080D Wedge compression fracture of T11-T12 vertebra, subsequent encounter for fracture with routine healing: Secondary | ICD-10-CM

## 2024-01-18 DIAGNOSIS — N1831 Chronic kidney disease, stage 3a: Secondary | ICD-10-CM | POA: Diagnosis not present

## 2024-01-18 DIAGNOSIS — I776 Arteritis, unspecified: Secondary | ICD-10-CM | POA: Diagnosis not present

## 2024-01-18 DIAGNOSIS — I639 Cerebral infarction, unspecified: Secondary | ICD-10-CM | POA: Insufficient documentation

## 2024-01-18 DIAGNOSIS — I619 Nontraumatic intracerebral hemorrhage, unspecified: Secondary | ICD-10-CM | POA: Diagnosis present

## 2024-01-18 DIAGNOSIS — K449 Diaphragmatic hernia without obstruction or gangrene: Secondary | ICD-10-CM | POA: Diagnosis not present

## 2024-01-18 DIAGNOSIS — S0232XA Fracture of orbital floor, left side, initial encounter for closed fracture: Secondary | ICD-10-CM | POA: Diagnosis not present

## 2024-01-18 DIAGNOSIS — S0292XA Unspecified fracture of facial bones, initial encounter for closed fracture: Secondary | ICD-10-CM | POA: Diagnosis present

## 2024-01-18 DIAGNOSIS — W19XXXA Unspecified fall, initial encounter: Principal | ICD-10-CM

## 2024-01-18 DIAGNOSIS — Z515 Encounter for palliative care: Secondary | ICD-10-CM

## 2024-01-18 DIAGNOSIS — F329 Major depressive disorder, single episode, unspecified: Secondary | ICD-10-CM | POA: Diagnosis not present

## 2024-01-18 DIAGNOSIS — M316 Other giant cell arteritis: Secondary | ICD-10-CM | POA: Diagnosis present

## 2024-01-18 LAB — HEMOGLOBIN A1C
Hgb A1c MFr Bld: 6.3 % — ABNORMAL HIGH (ref 4.8–5.6)
Mean Plasma Glucose: 134.11 mg/dL

## 2024-01-18 LAB — CBC WITH DIFFERENTIAL/PLATELET
Abs Immature Granulocytes: 0.04 10*3/uL (ref 0.00–0.07)
Basophils Absolute: 0 10*3/uL (ref 0.0–0.1)
Basophils Relative: 1 %
Eosinophils Absolute: 0.1 10*3/uL (ref 0.0–0.5)
Eosinophils Relative: 1 %
HCT: 30.1 % — ABNORMAL LOW (ref 36.0–46.0)
Hemoglobin: 9.3 g/dL — ABNORMAL LOW (ref 12.0–15.0)
Immature Granulocytes: 1 %
Lymphocytes Relative: 7 %
Lymphs Abs: 0.6 10*3/uL — ABNORMAL LOW (ref 0.7–4.0)
MCH: 27.8 pg (ref 26.0–34.0)
MCHC: 30.9 g/dL (ref 30.0–36.0)
MCV: 90.1 fL (ref 80.0–100.0)
Monocytes Absolute: 0.7 10*3/uL (ref 0.1–1.0)
Monocytes Relative: 8 %
Neutro Abs: 7.2 10*3/uL (ref 1.7–7.7)
Neutrophils Relative %: 82 %
Platelets: 457 10*3/uL — ABNORMAL HIGH (ref 150–400)
RBC: 3.34 MIL/uL — ABNORMAL LOW (ref 3.87–5.11)
RDW: 20.5 % — ABNORMAL HIGH (ref 11.5–15.5)
WBC: 8.6 10*3/uL (ref 4.0–10.5)
nRBC: 0 % (ref 0.0–0.2)

## 2024-01-18 LAB — COMPREHENSIVE METABOLIC PANEL WITH GFR
ALT: 10 U/L (ref 0–44)
AST: 20 U/L (ref 15–41)
Albumin: 3.6 g/dL (ref 3.5–5.0)
Alkaline Phosphatase: 91 U/L (ref 38–126)
Anion gap: 12 (ref 5–15)
BUN: 21 mg/dL (ref 8–23)
CO2: 25 mmol/L (ref 22–32)
Calcium: 9.5 mg/dL (ref 8.9–10.3)
Chloride: 96 mmol/L — ABNORMAL LOW (ref 98–111)
Creatinine, Ser: 1.33 mg/dL — ABNORMAL HIGH (ref 0.44–1.00)
GFR, Estimated: 40 mL/min — ABNORMAL LOW (ref >60)
Glucose, Bld: 110 mg/dL — ABNORMAL HIGH (ref 70–99)
Potassium: 4.4 mmol/L (ref 3.5–5.1)
Sodium: 133 mmol/L — ABNORMAL LOW (ref 135–145)
Total Bilirubin: 0.5 mg/dL (ref 0.0–1.2)
Total Protein: 7.1 g/dL (ref 6.5–8.1)

## 2024-01-18 LAB — GLUCOSE, CAPILLARY: Glucose-Capillary: 143 mg/dL — ABNORMAL HIGH (ref 70–99)

## 2024-01-18 LAB — TROPONIN I (HIGH SENSITIVITY)
Troponin I (High Sensitivity): 5 ng/L (ref <18)
Troponin I (High Sensitivity): 5 ng/L (ref <18)

## 2024-01-18 LAB — CK: Total CK: 56 U/L (ref 38–234)

## 2024-01-18 MED ORDER — LATANOPROST 0.005 % OP SOLN
1.0000 [drp] | Freq: Every day | OPHTHALMIC | Status: DC
  Administered 2024-01-19 – 2024-01-22 (×5): 1 [drp] via OPHTHALMIC
  Filled 2024-01-18 (×2): qty 2.5

## 2024-01-18 MED ORDER — AMOXICILLIN-POT CLAVULANATE 500-125 MG PO TABS
1.0000 | ORAL_TABLET | Freq: Two times a day (BID) | ORAL | Status: DC
  Administered 2024-01-19 – 2024-01-23 (×10): 1 via ORAL
  Filled 2024-01-18 (×12): qty 1

## 2024-01-18 MED ORDER — BISACODYL 10 MG RE SUPP
10.0000 mg | Freq: Every day | RECTAL | Status: DC | PRN
  Filled 2024-01-18 (×2): qty 1

## 2024-01-18 MED ORDER — SODIUM CHLORIDE 0.9% FLUSH: 3.0000 mL | INTRAVENOUS | Status: DC | PRN

## 2024-01-18 MED ORDER — IOHEXOL 300 MG/ML  SOLN
75.0000 mL | Freq: Once | INTRAMUSCULAR | Status: AC | PRN
  Administered 2024-01-18: 75 mL via INTRAVENOUS

## 2024-01-18 MED ORDER — FENTANYL CITRATE PF 50 MCG/ML IJ SOSY: 12.5000 ug | PREFILLED_SYRINGE | Freq: Once | INTRAMUSCULAR | Status: DC

## 2024-01-18 MED ORDER — LACTULOSE 10 GM/15ML PO SOLN
30.0000 g | Freq: Once | ORAL | Status: AC
  Administered 2024-01-19: 30 g via ORAL
  Filled 2024-01-18: qty 60

## 2024-01-18 MED ORDER — DULOXETINE HCL 30 MG PO CPEP
30.0000 mg | ORAL_CAPSULE | Freq: Every day | ORAL | Status: DC
  Administered 2024-01-19 – 2024-01-22 (×4): 30 mg via ORAL
  Filled 2024-01-18 (×4): qty 1

## 2024-01-18 MED ORDER — INSULIN ASPART 100 UNIT/ML IJ SOLN
0.0000 [IU] | Freq: Three times a day (TID) | INTRAMUSCULAR | Status: DC
  Administered 2024-01-19 – 2024-01-22 (×9): 1 [IU] via SUBCUTANEOUS
  Filled 2024-01-18 (×8): qty 1

## 2024-01-18 MED ORDER — SENNOSIDES-DOCUSATE SODIUM 8.6-50 MG PO TABS
1.0000 | ORAL_TABLET | Freq: Two times a day (BID) | ORAL | Status: DC
  Administered 2024-01-18: 1 via ORAL
  Filled 2024-01-18 (×2): qty 1

## 2024-01-18 MED ORDER — HYDROMORPHONE HCL 1 MG/ML IJ SOLN
0.5000 mg | Freq: Once | INTRAMUSCULAR | Status: AC
  Administered 2024-01-19: 0.5 mg via INTRAVENOUS
  Filled 2024-01-18: qty 0.5

## 2024-01-18 MED ORDER — TIMOLOL MALEATE 0.5 % OP SOLN
1.0000 [drp] | Freq: Two times a day (BID) | OPHTHALMIC | Status: DC
  Administered 2024-01-19 – 2024-01-23 (×10): 1 [drp] via OPHTHALMIC
  Filled 2024-01-18 (×2): qty 5

## 2024-01-18 MED ORDER — SODIUM CHLORIDE 0.9% FLUSH
3.0000 mL | Freq: Two times a day (BID) | INTRAVENOUS | Status: DC
  Administered 2024-01-19 – 2024-01-23 (×10): 3 mL via INTRAVENOUS

## 2024-01-18 MED ORDER — SPIRONOLACTONE 25 MG PO TABS
25.0000 mg | ORAL_TABLET | Freq: Every day | ORAL | Status: DC
  Administered 2024-01-19 – 2024-01-23 (×5): 25 mg via ORAL
  Filled 2024-01-18 (×5): qty 1

## 2024-01-18 MED ORDER — OXYCODONE HCL 5 MG PO TABS
10.0000 mg | ORAL_TABLET | Freq: Once | ORAL | Status: AC
  Administered 2024-01-18: 10 mg via ORAL
  Filled 2024-01-18: qty 2

## 2024-01-18 MED ORDER — SODIUM CHLORIDE 0.9 % IV SOLN: 250.0000 mL | INTRAVENOUS | Status: AC | PRN

## 2024-01-18 MED ORDER — MIRTAZAPINE 15 MG PO TABS
15.0000 mg | ORAL_TABLET | Freq: Every day | ORAL | Status: DC
  Administered 2024-01-18 – 2024-01-21 (×4): 15 mg via ORAL
  Filled 2024-01-18 (×4): qty 1

## 2024-01-18 MED ORDER — ATORVASTATIN CALCIUM 20 MG PO TABS
80.0000 mg | ORAL_TABLET | Freq: Every day | ORAL | Status: DC
  Administered 2024-01-18 – 2024-01-23 (×6): 80 mg via ORAL
  Filled 2024-01-18 (×6): qty 4

## 2024-01-18 MED ORDER — OXYCODONE HCL 5 MG PO TABS: 5.0000 mg | ORAL_TABLET | Freq: Once | ORAL | Status: DC

## 2024-01-18 MED ORDER — SERTRALINE HCL 50 MG PO TABS
50.0000 mg | ORAL_TABLET | Freq: Every day | ORAL | Status: DC
  Administered 2024-01-18 – 2024-01-21 (×4): 50 mg via ORAL
  Filled 2024-01-18 (×4): qty 1

## 2024-01-18 MED ORDER — HYDROCHLOROTHIAZIDE 25 MG PO TABS
25.0000 mg | ORAL_TABLET | Freq: Every day | ORAL | Status: DC
  Administered 2024-01-18 – 2024-01-23 (×6): 25 mg via ORAL
  Filled 2024-01-18 (×6): qty 1

## 2024-01-18 MED ORDER — SODIUM CHLORIDE 0.9 % IV BOLUS
500.0000 mL | Freq: Once | INTRAVENOUS | Status: AC
  Administered 2024-01-18: 500 mL via INTRAVENOUS

## 2024-01-18 MED ORDER — OXYCODONE HCL 5 MG PO TABS
5.0000 mg | ORAL_TABLET | Freq: Two times a day (BID) | ORAL | Status: DC | PRN
  Administered 2024-01-18 – 2024-01-21 (×5): 5 mg via ORAL
  Filled 2024-01-18 (×5): qty 1

## 2024-01-18 MED ORDER — ENOXAPARIN SODIUM 40 MG/0.4ML IJ SOSY
40.0000 mg | PREFILLED_SYRINGE | INTRAMUSCULAR | Status: DC
  Administered 2024-01-18 – 2024-01-22 (×5): 40 mg via SUBCUTANEOUS
  Filled 2024-01-18 (×5): qty 0.4

## 2024-01-18 MED ORDER — INSULIN ASPART 100 UNIT/ML IJ SOLN: 0.0000 [IU] | Freq: Every day | INTRAMUSCULAR | Status: DC

## 2024-01-18 NOTE — ED Provider Notes (Addendum)
 [facility] Provider Note    Event Date/Time   First MD Initiated Contact with Patient 01/18/24 [phone]     (approximate)   History   Fall   HPI  Wanda Monroe is a 83 y.o. female with COPD on aspirin  who is currently on hospice who comes in with concerns for a fall.  To know I did see patient myself on 5/1 work patient had a compression fracture of her spine and patient placed in a TLSO's brace.  Patient reportedly fell at home earlier this morning with pain underneath the left breast.  Patient also reports that she has some pain on the left side of her face, numbness.  She reports pain on the middle of her chest and left side of her breast from the fall.  She reports the fall happened last night.  Unclear exactly what time and how long she was on the ground for.  I did call the patient's hospice nurse she does report that she was complaining of pain in the chest after the fall which is what had been sent to read.  She is currently on oxycodone  10 mg.  She resides by herself during the day but a nephew stays there at nighttime. She was unclear exactly when the fall happened as well.  Patient was recently treated for UTI with 3 days of Bactrim with her last dose being today.  They are concerned with patient's ability to be by herself during the day and feeling she may need a SNF placement.   Physical Exam   Triage Vital Signs: ED Triage Vitals  Encounter Vitals Group     BP 01/18/24 1209 (!) 141/67     Systolic BP Percentile --      Diastolic BP Percentile --      Pulse Rate 01/18/24 1209 (!) 102     Resp 01/18/24 1209 18     Temp 01/18/24 1209 98 F (36.7 C)     Temp Source 01/18/24 1209 Oral     SpO2 01/18/24 1209 96 %     Weight 01/18/24 1208 128 lb (58.1 kg)     Height 01/18/24 1208 5\' 7"  (1.702 m)     Head Circumference --      Peak Flow --      Pain Score 01/18/24 1208 9     Pain Loc --      Pain Education --      Exclude from Growth Chart --      Most recent vital signs: Vitals:   01/18/24 1455 01/18/24 1500  BP:  111/89  Pulse: 80 80  Resp:    Temp:    SpO2: 100% 100%     General: Awake, no distress.  CV:  Good peripheral perfusion.  Resp:  Normal effort.  Abd:  No distention.  Other:  Patient is able to lift both legs up off the bed.  She reports some slight difficulty with the right leg but she states that is baseline from arthritis.  She got equal grip strength no pronator drift.  She is got some pain on her left cheekbone which she reports feels numb.  She also has pain on her left chest wall, mid sternum area.  She got no obvious CTL spine tenderness.   ED Results / Procedures / Treatments   Labs (all labs ordered are listed, but only abnormal results are displayed) Labs Reviewed  COMPREHENSIVE METABOLIC PANEL WITH GFR - Abnormal; Notable for the following  components:      Result Value   Sodium 133 (*)    Chloride 96 (*)    Glucose, Bld 110 (*)    Creatinine, Ser 1.33 (*)    GFR, Estimated 40 (*)    All other components within normal limits  CBC WITH DIFFERENTIAL/PLATELET - Abnormal; Notable for the following components:   RBC 3.34 (*)    Hemoglobin 9.3 (*)    HCT 30.1 (*)    RDW 20.5 (*)    Platelets 457 (*)    Lymphs Abs 0.6 (*)    All other components within normal limits  TROPONIN I (HIGH SENSITIVITY)  TROPONIN I (HIGH SENSITIVITY)     EKG  My interpretation of EKG:  Normal sinus rate of 98 without any ST elevation or T wave inversions, normal intervals  RADIOLOGY I have reviewed the xray personally and interpreted no obvious rib fracture   PROCEDURES:  Critical Care performed: No  .1-3 Lead EKG Interpretation  Performed by: Lubertha Rush, MD Authorized by: Lubertha Rush, MD     Interpretation: normal     ECG rate:  70   ECG rate assessment: normal     Rhythm: sinus rhythm     Ectopy: none     Conduction: normal      MEDICATIONS ORDERED IN ED: Medications  oxyCODONE   (Oxy IR/ROXICODONE ) immediate release tablet 10 mg (10 mg Oral Given 01/18/24 1607)  sodium chloride  0.9 % bolus 500 mL (0 mLs Intravenous Stopped 01/18/24 1636)  iohexol  (OMNIPAQUE ) 300 MG/ML solution 75 mL (75 mLs Intravenous Contrast Given 01/18/24 1616)     IMPRESSION / MDM / ASSESSMENT AND PLAN / ED COURSE  I reviewed the triage vital signs and the nursing notes.   Patient's presentation is most consistent with acute presentation with potential threat to life or bodily function.  Patient comes in with a fall she is not the best historian.  She is on hospice but she does not want to proceed with CT imaging.  Initial CTs from triage recommended CT face given some tenderness on the side we will proceed as well as will get CT chest abdomen pelvis to look for other injuries from the fall given she does have left-sided chest wall pain.  Patient will be given a dose of her oxycodone .  Troponin negative CMP shows slight elevation of creatinine at 1.33 slightly low sodium, chloride CBC shows stable hemoglobin.  1. No acute intracranial pathology. 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 2. Intermediate attenuation left maxillary sinus air-fluid level. Overlying irregularity of the maxillary sinus walls, of uncertain acuity. Correlate for point tenderness and consider dedicated maxillofacial CT to further evaluate. 3. No fracture or static subluxation of the cervical spine.  1. No evidence of an acute cardiopulmonary abnormality. 2. Vertebral compression fracture within the lower thoracic or upper lumbar spine (with up to 40% vertebral body height loss), new or progressed since the lumbar spine CT of 01/11/2024. Dedicated spinal imaging recommended. 3. Please note, this examination is not specifically tailored for the assessment of the ribs. Rib radiographs may be obtained for further evaluation, as clinically  warranted. 4. Large hiatal hernia. 5. Aortic Atherosclerosis (ICD10-I70.0).   -nothing surgical from ENT vaught- does recommend antibiotics -for back spine patient has strength in both legs little weaker on the right but she states that is baseline.  No numbness. Sent message to Dr Mont Antis -for the sternum fracture-EKG and cardiac markers are  negative  Given the multiple fractures although they are all nonoperative given patient's age and the concern with her being able to be by herself will discuss with the hospital team for admission.  Did discuss with patient's friend who helps patient and he also agreed with this plan.   Given she has no other neurological symptoms other than the numbness on the left face where she has all these fractures my suspicion is that this is not a stroke.  Patient was out of the window for stroke code regardless with a last known normal of yesterday unclear exact timing.  CT reads- Mildly displaced manubrial fracture is noted.   Mild superior endplate depression of T12 vertebral body is noted which was present on prior exam of Jan 11, 2024, and is concerning for acute to subacute compression fracture.   Large sliding-type hiatal hernia.   Coronary artery calcifications are noted suggesting coronary artery disease.   Aortic Atherosclerosis (ICD10-I70.0).  IMPRESSION: 1. Acute fractures of the left inferior orbital wall, anterior wall of the left maxillary sinus, and lateral wall of the left orbit. 2. Hyperdense air-fluid level in the left maxillary sinus compatible with hemorrhage.  1. Progression of T12 superior endplate compression fracture since the prior CT with extension of the fracture to the anterior vertebral body. There is a 4 mm retropulsion of the posterior superior endplate. 2. No acute/traumatic lumbar spine pathology.    The patient is on the cardiac monitor to evaluate for evidence of arrhythmia and/or significant heart rate  changes.      FINAL CLINICAL IMPRESSION(S) / ED DIAGNOSES   Final diagnoses:  Fall, initial encounter  Closed fracture of manubrium, initial encounter  Closed fracture of facial bone, unspecified facial bone, initial encounter (HCC)  Compression fracture of thoracic vertebra, unspecified thoracic vertebral level, initial encounter (HCC)     Rx / DC Orders   ED Discharge Orders     None        Note:  This document was prepared using Dragon voice recognition software and may include unintentional dictation errors.   Lubertha Rush, MD 01/18/24 1739    Lubertha Rush, MD 01/18/24 1739

## 2024-01-18 NOTE — ED Triage Notes (Addendum)
 Patient states she fell at home at some point early this morning; complaining of pain under left breast and numbness to left side of face. Denies thinners. LKW 2030 on 01/17/2024

## 2024-01-18 NOTE — H&P (Signed)
 History and Physical    Wanda Monroe ZOX:[id number] DOB: 02/20/1941 DOA: 01/18/2024  PCP: Pcp, No  Patient coming from: home   Chief Complaint: fall  HPI: Wanda Monroe is a 83 y.o. female with medical history significant for dementia, history hemorrhagic cva, htn, ra, dm, tr, hiatal hernia, on hospice, who presents with the above.  Tripped and fell a week ago, seen in our ER, diagnosed with thoracic compression fracture. Presents with another trip and fall today, complaining of left facial pain and numbness. No cough. No lower extremity weakness/numbness. No fevers. No chest pain.    Review of Systems: As per HPI otherwise 10 point review of systems negative.    Past Medical History:  Diagnosis Date   Anemia    Arthritis    rhuematoid,OSTEOARTHRITIS   Cellulitis    Diabetes mellitus without complication (HCC)    type II   GERD (gastroesophageal reflux disease)    Gout    Hiatal hernia    Hyperlipidemia    Hypertension    Neuromuscular disorder (HCC)    left foot neuropathy   Osteoporosis    Rosacea    Spinal stenosis of lumbar region    Stroke [facility])    x2, 9/10   Temporal arteritis (HCC)    Wears dentures    full top, partial bottom    Past Surgical History:  Procedure Laterality Date   ABDOMINAL HYSTERECTOMY     APPENDECTOMY     BACK SURGERY     lumbar and cervical laminectomies   BREAST BIOPSY Left 1960,1980   twice negative results   CAPSULOTOMY METATARSOPHALANGEAL Left 01/22/2015   Procedure: CAPSULOTOMY METATARSOPHALANGEAL;  Surgeon: Sharlyn Deaner, DPM;  Location: [facility];  Service: Podiatry;  Laterality: Left;   CATARACT EXTRACTION W/PHACO Left 10/19/2016   Procedure: CATARACT EXTRACTION PHACO AND INTRAOCULAR LENS PLACEMENT (IOC)  Left Eye  Diabetic;  Surgeon: Annell Kidney, MD;  Location: [facility];  Service: Ophthalmology;  Laterality: Left;  Diabetic - oral meds Left Eye   CATARACT EXTRACTION W/PHACO Right 11/16/2016    Procedure: CATARACT EXTRACTION PHACO AND INTRAOCULAR LENS PLACEMENT (IOC) dIABETIC;  Surgeon: Annell Kidney, MD;  Location: [facility];  Service: Ophthalmology;  Laterality: Right;  DIABETES - oral meds   COLONOSCOPY WITH PROPOFOL  N/A 04/24/2018   Procedure: COLONOSCOPY WITH PROPOFOL ;  Surgeon: Toledo, Alphonsus Jeans, MD;  Location: [facility] ENDOSCOPY;  Service: Gastroenterology;  Laterality: N/A;   ESOPHAGOGASTRODUODENOSCOPY N/A 07/01/2021   Procedure: ESOPHAGOGASTRODUODENOSCOPY (EGD);  Surgeon: Conrado Delay, DO;  Location: [facility] ENDOSCOPY;  Service: General;  Laterality: N/A;   ESOPHAGOGASTRODUODENOSCOPY (EGD) WITH PROPOFOL  N/A 04/24/2018   Procedure: ESOPHAGOGASTRODUODENOSCOPY (EGD) WITH PROPOFOL ;  Surgeon: Toledo, Alphonsus Jeans, MD;  Location: [facility] ENDOSCOPY;  Service: Gastroenterology;  Laterality: N/A;   FOOT SURGERY     HALLUX VALGUS AKIN Left 01/22/2015   Procedure: HALLUX VALGUS AKIN;  Surgeon: Sharlyn Deaner, DPM;  Location: [facility];  Service: Podiatry;  Laterality: Left;  left great toe   HEMORRHOID SURGERY     HIP ARTHROPLASTY Right 05/21/2023   Procedure: ARTHROPLASTY BIPOLAR HIP (HEMIARTHROPLASTY);  Surgeon: Elner Hahn, MD;  Location: [facility] ORS;  Service: Orthopedics;  Laterality: Right;   KNEE ARTHROSCOPY     LUMBAR LAMINECTOMY  04/23/13   NOSE SURGERY     TONSILLECTOMY     URETER SURGERY       reports that she has never smoked. She has never used smokeless tobacco. She reports that she does not currently use  drugs. She reports that she does not drink alcohol.  Allergies  Allergen Reactions   Codeine Sulfate Nausea Only   Remicade [Infliximab] Other (See Comments)    Infection    Family History  Problem Relation Age of Onset   Diabetes Father    Stroke Father    Breast cancer Neg Hx     Prior to Admission medications   Medication Sig Start Date End Date Taking? Authorizing Provider  acetaminophen  (TYLENOL ) 650 MG CR tablet Take 650 mg by mouth every 8  (eight) hours.    [provider]  apixaban  (ELIQUIS ) 2.5 MG TABS tablet Take 1 tablet (2.5 mg total) by mouth 2 (two) times daily. 05/24/23   Ezzard Holms, MD  aspirin  81 MG tablet Take 81 mg by mouth daily. AM    [provider]  atorvastatin  (LIPITOR) 80 MG tablet Take 80 mg by mouth daily. PM    [provider]  bisacodyl  (DULCOLAX) 10 MG suppository Place 1 suppository (10 mg total) rectally daily as needed for moderate constipation. 05/24/23   Ezzard Holms, MD  calcium  citrate-vitamin D  (CITRACAL+D) 315-200 MG-UNIT per tablet Take 1 tablet by mouth daily.    [provider]  cholecalciferol  (CHOLECALCIFEROL ) 25 MCG tablet Take 1 tablet (1,000 Units total) by mouth daily. 04/13/22   Patel, Sona, MD  docusate sodium  (COLACE) 100 MG capsule Take 1 capsule (100 mg total) by mouth 2 (two) times daily. 05/24/23   Ezzard Holms, MD  DULoxetine  (CYMBALTA ) 30 MG capsule Take 30 mg by mouth daily. 07/07/21   [provider]  ferrous sulfate  325 (65 FE) MG tablet Take 1 tablet (325 mg total) by mouth daily with breakfast. 05/25/23   Ezzard Holms, MD  folic acid  (FOLVITE ) 1 MG tablet Take 1 mg by mouth daily.    [provider]  gabapentin  (NEURONTIN ) 100 MG capsule Take 200 mg by mouth 2 (two) times daily.    [provider]  hydrochlorothiazide  (HYDRODIURIL ) 25 MG tablet Take 25 mg by mouth daily.    [provider]  HYDROcodone -acetaminophen  (NORCO/VICODIN) 5-325 MG tablet Take 1 tablet by mouth every 6 (six) hours as needed for moderate pain. 05/24/23   Rojelio Clement, PA-C  ipratropium-albuterol (DUONEB) 0.5-2.5 (3) MG/3ML SOLN Take 3 mLs by nebulization every 6 (six) hours as needed (breathing).    [provider]  latanoprost  (XALATAN ) 0.005 % ophthalmic solution Place 1 drop into both eyes at bedtime.    [provider]  metFORMIN (GLUCOPHAGE) 500 MG tablet Take 500 mg by mouth 2 (two) times daily with a  meal.    [provider]  methotrexate  (RHEUMATREX) 2.5 MG tablet Take 12.5 mg by mouth every Thursday. Patient not taking: Reported on 05/21/2023    [provider]  mirtazapine  (REMERON ) 15 MG tablet Take 15 mg by mouth at bedtime. 08/09/21   [provider]  ondansetron  (ZOFRAN ) 4 MG tablet Take 1 tablet (4 mg total) by mouth every 6 (six) hours as needed for nausea. 05/24/23   Ezzard Holms, MD  oxyCODONE  (OXY IR/ROXICODONE ) 5 MG immediate release tablet Take 5 mg by mouth 2 (two) times daily as needed for severe pain or moderate pain. 05/17/23   [provider]  pantoprazole  (PROTONIX ) 40 MG tablet Take 1 tablet (40 mg total) by mouth daily. 04/12/22   Patel, Sona, MD  sennosides-docusate sodium  (SENOKOT-S) 8.6-50 MG tablet Take 1 tablet by mouth 2 (two) times daily.  [provider]  sertraline  (ZOLOFT ) 50 MG tablet Take 50 mg by mouth at bedtime.    [provider]  spironolactone  (ALDACTONE ) 25 MG tablet Take 25 mg by mouth daily.    [provider]  timolol  (TIMOPTIC ) 0.5 % ophthalmic solution Place 1 drop into both eyes 2 (two) times daily.    [provider]  tiZANidine  (ZANAFLEX ) 4 MG tablet Take 4 mg by mouth 3 (three) times daily as needed for muscle spasms. 06/22/21   [provider]  vitamin B-12 (CYANOCOBALAMIN ) 1000 MCG tablet Take 1,000 mcg by mouth daily.    [provider]    Physical Exam: Vitals:   01/18/24 1603 01/18/24 1645 01/18/24 1700 01/18/24 1730  BP:    (!) 180/68  Pulse: 81 83 77 79  Resp:      Temp:      TempSrc:      SpO2: 100% 96% 99% 97%  Weight:      Height:        Constitutional: No acute distress Head: no apparent facial trauma Eyes: Conjunctiva clear ENM: poor dentition  Neck: Supple Respiratory: Clear to auscultation bilaterally, save for rales at bases Cardiovascular: Regular rate and rhythm. Systolic murmur Abdomen: Non-tender, non-distended.    Musculoskeletal: No joint deformity upper and lower extremities.   Skin: No rashes, lesions, or ulcers.  Extremities: No peripheral edema. warm Neurologic: Alert, moving all 4 extremities. Psychiatric: seems a bit confused   Labs on Admission: I have personally reviewed following labs and imaging studies  CBC: Recent Labs  Lab 01/18/24 1215  WBC 8.6  NEUTROABS 7.2  HGB 9.3*  HCT 30.1*  MCV 90.1  PLT 457*   Basic Metabolic Panel: Recent Labs  Lab 01/18/24 1215  NA 133*  K 4.4  CL 96*  CO2 25  GLUCOSE 110*  BUN 21  CREATININE 1.33*  CALCIUM  9.5   GFR: Estimated Creatinine Clearance: 30.4 mL/min (A) (by C-G formula based on SCr of 1.33 mg/dL (H)). Liver Function Tests: Recent Labs  Lab 01/18/24 1215  AST 20  ALT 10  ALKPHOS 91  BILITOT 0.5  PROT 7.1  ALBUMIN 3.6   No results for input(s): "LIPASE", "AMYLASE" in the last 168 hours. No results for input(s): "AMMONIA" in the last 168 hours. Coagulation Profile: No results for input(s): "INR", "PROTIME" in the last 168 hours. Cardiac Enzymes: No results for input(s): "CKTOTAL", "CKMB", "CKMBINDEX", "TROPONINI" in the last 168 hours. BNP (last 3 results) No results for input(s): "PROBNP" in the last 8760 hours. HbA1C: No results for input(s): "HGBA1C" in the last 72 hours. CBG: No results for input(s): "GLUCAP" in the last 168 hours. Lipid Profile: No results for input(s): "CHOL", "HDL", "LDLCALC", "TRIG", "CHOLHDL", "LDLDIRECT" in the last 72 hours. Thyroid  Function Tests: No results for input(s): "TSH", "T4TOTAL", "FREET4", "T3FREE", "THYROIDAB" in the last 72 hours. Anemia Panel: No results for input(s): "VITAMINB12", "FOLATE", "FERRITIN", "TIBC", "IRON ", "RETICCTPCT" in the last 72 hours. Urine analysis:    Component Value Date/Time   COLORURINE YELLOW (A) 05/20/2023 2137   APPEARANCEUR CLEAR (A) 05/20/2023 2137   LABSPEC 1.023 05/20/2023 2137   PHURINE 5.0 05/20/2023 2137   GLUCOSEU NEGATIVE  05/20/2023 2137   HGBUR NEGATIVE 05/20/2023 2137   BILIRUBINUR NEGATIVE 05/20/2023 2137   KETONESUR NEGATIVE 05/20/2023 2137   PROTEINUR NEGATIVE 05/20/2023 2137   NITRITE NEGATIVE 05/20/2023 2137   LEUKOCYTESUR NEGATIVE 05/20/2023 2137    Radiological Exams on Admission: CT L-SPINE NO CHARGE Result Date: 01/18/2024  CLINICAL DATA:  Fall and back pain.  Left facial numbness. EXAM: CT THORACIC AND LUMBAR SPINE WITHOUT CONTRAST TECHNIQUE: Multidetector CT imaging of the thoracic and lumbar spine was performed without contrast. Multiplanar CT image reconstructions were also generated. RADIATION DOSE REDUCTION: This exam was performed according to the departmental dose-optimization program which includes automated exposure control, adjustment of the mA and/or kV according to patient size and/or use of iterative reconstruction technique. COMPARISON:  Chest radiograph dated 01/18/2024 and CT abdomen pelvis dated 01/11/2024. FINDINGS: CT THORACIC SPINE FINDINGS Alignment: No acute subluxation. Vertebrae: Progression of T12 superior endplate compression fracture compared to prior CT with extension of the fracture to the anterior vertebral body. There is approximately 10% loss of vertebral body height. There is a 4 mm retropulsion of the posterosuperior endplate. No other acute thoracic spine fracture. The bones are osteopenic. Paraspinal and other soft tissues: Mild standing adjacent to T12. No paraspinal fluid collection or hematoma. Disc levels: Multilevel degenerative changes. CT LUMBAR SPINE FINDINGS Segmentation: 5 lumbar type vertebrae. Alignment: No acute subluxation. Vertebrae: No acute fracture.  Osteopenia. Paraspinal and other soft tissues: No acute findings. Disc levels: Multilevel degenerative changes and disc desiccation and vacuum phenomena. IMPRESSION: 1. Progression of T12 superior endplate compression fracture since the prior CT with extension of the fracture to the anterior vertebral body. There  is a 4 mm retropulsion of the posterior superior endplate. 2. No acute/traumatic lumbar spine pathology. Electronically Signed   By: Angus Bark M.D.   On: 01/18/2024 17:16   CT T-SPINE NO CHARGE Result Date: 01/18/2024 CLINICAL DATA:  Fall and back pain.  Left facial numbness. EXAM: CT THORACIC AND LUMBAR SPINE WITHOUT CONTRAST TECHNIQUE: Multidetector CT imaging of the thoracic and lumbar spine was performed without contrast. Multiplanar CT image reconstructions were also generated. RADIATION DOSE REDUCTION: This exam was performed according to the departmental dose-optimization program which includes automated exposure control, adjustment of the mA and/or kV according to patient size and/or use of iterative reconstruction technique. COMPARISON:  Chest radiograph dated 01/18/2024 and CT abdomen pelvis dated 01/11/2024. FINDINGS: CT THORACIC SPINE FINDINGS Alignment: No acute subluxation. Vertebrae: Progression of T12 superior endplate compression fracture compared to prior CT with extension of the fracture to the anterior vertebral body. There is approximately 10% loss of vertebral body height. There is a 4 mm retropulsion of the posterosuperior endplate. No other acute thoracic spine fracture. The bones are osteopenic. Paraspinal and other soft tissues: Mild standing adjacent to T12. No paraspinal fluid collection or hematoma. Disc levels: Multilevel degenerative changes. CT LUMBAR SPINE FINDINGS Segmentation: 5 lumbar type vertebrae. Alignment: No acute subluxation. Vertebrae: No acute fracture.  Osteopenia. Paraspinal and other soft tissues: No acute findings. Disc levels: Multilevel degenerative changes and disc desiccation and vacuum phenomena. IMPRESSION: 1. Progression of T12 superior endplate compression fracture since the prior CT with extension of the fracture to the anterior vertebral body. There is a 4 mm retropulsion of the posterior superior endplate. 2. No acute/traumatic lumbar spine  pathology. Electronically Signed   By: Angus Bark M.D.   On: 01/18/2024 17:16   CT CHEST ABDOMEN PELVIS W CONTRAST Result Date: 01/18/2024 CLINICAL DATA:  Fall today. EXAM: CT CHEST, ABDOMEN, AND PELVIS WITH CONTRAST TECHNIQUE: Multidetector CT imaging of the chest, abdomen and pelvis was performed following the standard protocol during bolus administration of intravenous contrast. RADIATION DOSE REDUCTION: This exam was performed according to the departmental dose-optimization program which includes automated exposure control, adjustment of the mA and/or kV according  to patient size and/or use of iterative reconstruction technique. CONTRAST:  75mL OMNIPAQUE  IOHEXOL  300 MG/ML  SOLN COMPARISON:  Jan 11, 2024. FINDINGS: CT CHEST FINDINGS Cardiovascular: Atherosclerosis of thoracic aorta without aneurysm or dissection. Normal cardiac size. Coronary artery calcifications are noted. No pericardial effusion. Mediastinum/Nodes: Large sliding-type hiatal hernia. No adenopathy. Thyroid  gland is unremarkable. Lungs/Pleura: No pneumothorax or pleural effusion is noted. Probable mild atelectasis or scarring is noted in right middle lobe and lingular segment of left upper lobe. Musculoskeletal: Probable mildly displaced manubrial fracture is noted. Probable mild depression of superior endplate of T12 vertebral body concerning for acute to subacute fracture as noted on prior CT scan. CT ABDOMEN PELVIS FINDINGS Hepatobiliary: No focal liver abnormality is seen. No gallstones, gallbladder wall thickening, or biliary dilatation. Pancreas: Unremarkable. No pancreatic ductal dilatation or surrounding inflammatory changes. Spleen: Normal in size without focal abnormality. Adrenals/Urinary Tract: Adrenal glands are unremarkable. Kidneys are normal, without renal calculi, focal lesion, or hydronephrosis. Bladder is unremarkable. Stomach/Bowel: Stomach is unremarkable. There is no evidence of bowel obstruction or inflammation.  Status post appendectomy. Vascular/Lymphatic: Aortic atherosclerosis. No enlarged abdominal or pelvic lymph nodes. Reproductive: Status post hysterectomy. No adnexal masses. Other: No ascites or hernia is noted. Musculoskeletal: Status post right total hip arthroplasty. Multilevel degenerative changes are noted in lumbar spine. IMPRESSION: Mildly displaced manubrial fracture is noted. Mild superior endplate depression of T12 vertebral body is noted which was present on prior exam of Jan 11, 2024, and is concerning for acute to subacute compression fracture. Large sliding-type hiatal hernia. Coronary artery calcifications are noted suggesting coronary artery disease. Aortic Atherosclerosis (ICD10-I70.0). Electronically Signed   By: Rosalene Colon M.D.   On: 01/18/2024 16:56   CT Maxillofacial Wo Contrast Result Date: 01/18/2024 CLINICAL DATA:  Trauma.  Fall. EXAM: CT MAXILLOFACIAL WITHOUT CONTRAST TECHNIQUE: Multidetector CT imaging of the maxillofacial structures was performed. Multiplanar CT image reconstructions were also generated. RADIATION DOSE REDUCTION: This exam was performed according to the departmental dose-optimization program which includes automated exposure control, adjustment of the mA and/or kV according to patient size and/or use of iterative reconstruction technique. COMPARISON:  None Available. FINDINGS: Osseous: There is a left inferior orbital wall fracture, minimally depressed/displaced. There is some fat herniation into the sinus. There is also acute nondisplaced fracture of the lateral wall the left maxillary sinus in minimally depressed acute fracture of the anterior wall of the left maxillary sinus. There is also subtle nondisplaced fracture of the lateral left orbital wall. There is no dislocation. Orbits: There is mild orbital edema inferiorly. No evidence for muscle entrapment or focal hematoma. The globes appear intact bilaterally. No foreign body Sinuses: There is a hyperdense  air-fluid level in the left maxillary sinus. Soft tissues: No focal hematoma or foreign body. Limited intracranial: No significant or unexpected finding. IMPRESSION: 1. Acute fractures of the left inferior orbital wall, anterior wall of the left maxillary sinus, and lateral wall of the left orbit. 2. Hyperdense air-fluid level in the left maxillary sinus compatible with hemorrhage. Electronically Signed   By: Tyron Gallon M.D.   On: 01/18/2024 16:51   DG Chest 2 View Result Date: 01/18/2024 CLINICAL DATA:  Provided history: Fall. Chest pain. Additional history provided: Left lower rib pain after fall this morning. EXAM: CHEST - 2 VIEW COMPARISON:  Prior chest radiographs 01/11/2024 and earlier. Lumbar spine CT 01/11/2024. FINDINGS: Heart size within normal limits. Aortic atherosclerosis. Large hiatal hernia. No appreciable airspace consolidation. No evidence of pleural effusion or pneumothorax. Anterior  wedge vertebral compression fracture within the lower thoracic or upper lumbar spine (with up to 40% vertebral body height loss), new or progressed since the lumbar spine CT of 01/11/2024. No other acute osseous abnormality is identified. IMPRESSION: 1. No evidence of an acute cardiopulmonary abnormality. 2. Vertebral compression fracture within the lower thoracic or upper lumbar spine (with up to 40% vertebral body height loss), new or progressed since the lumbar spine CT of 01/11/2024. Dedicated spinal imaging recommended. 3. Please note, this examination is not specifically tailored for the assessment of the ribs. Rib radiographs may be obtained for further evaluation, as clinically warranted. 4. Large hiatal hernia. 5. Aortic Atherosclerosis (ICD10-I70.0). Electronically Signed   By: Bascom Lily D.O.   On: 01/18/2024 13:21   CT HEAD WO CONTRAST ( ) Result Date: 01/18/2024 CLINICAL DATA:  Head and neck trauma EXAM: CT HEAD WITHOUT CONTRAST CT CERVICAL SPINE WITHOUT CONTRAST TECHNIQUE: Multidetector CT  imaging of the head and cervical spine was performed following the standard protocol without intravenous contrast. Multiplanar CT image reconstructions of the cervical spine were also generated. RADIATION DOSE REDUCTION: This exam was performed according to the departmental dose-optimization program which includes automated exposure control, adjustment of the mA and/or kV according to patient size and/or use of iterative reconstruction technique. COMPARISON:  01/11/2024 FINDINGS: CT HEAD FINDINGS Brain: No evidence of acute infarction, hemorrhage, hydrocephalus, extra-axial collection or mass lesion/mass effect. Periventricular and deep white matter hypodensity. Unchanged right occipital and right cerebellar hemisphere encephalomalacia. Vascular: No hyperdense vessel or unexpected calcification. Skull: Normal. Negative for fracture or focal lesion. Sinuses/Orbits: Intermediate attenuation left maxillary sinus air-fluid level. Overlying irregularity of the maxillary sinus walls, of uncertain acuity (series 5, image 5) Other: None. CT CERVICAL SPINE FINDINGS Alignment: Normal. Skull base and vertebrae: No acute fracture. No primary bone lesion or focal pathologic process. Soft tissues and spinal canal: No prevertebral fluid or swelling. No visible canal hematoma. Disc levels:  Intact. Upper chest: Negative. Other: None. IMPRESSION: 1. No acute intracranial pathology. 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 2. Intermediate attenuation left maxillary sinus air-fluid level. Overlying irregularity of the maxillary sinus walls, of uncertain acuity. Correlate for point tenderness and consider dedicated maxillofacial CT to further evaluate. 3. No fracture or static subluxation of the cervical spine. Electronically Signed   By: Fredricka Jenny M.D.   On: 01/18/2024 13:06   CT Cervical Spine Wo Contrast Result Date: 01/18/2024 CLINICAL DATA:  Head and  neck trauma EXAM: CT HEAD WITHOUT CONTRAST CT CERVICAL SPINE WITHOUT CONTRAST TECHNIQUE: Multidetector CT imaging of the head and cervical spine was performed following the standard protocol without intravenous contrast. Multiplanar CT image reconstructions of the cervical spine were also generated. RADIATION DOSE REDUCTION: This exam was performed according to the departmental dose-optimization program which includes automated exposure control, adjustment of the mA and/or kV according to patient size and/or use of iterative reconstruction technique. COMPARISON:  01/11/2024 FINDINGS: CT HEAD FINDINGS Brain: No evidence of acute infarction, hemorrhage, hydrocephalus, extra-axial collection or mass lesion/mass effect. Periventricular and deep white matter hypodensity. Unchanged right occipital and right cerebellar hemisphere encephalomalacia. Vascular: No hyperdense vessel or unexpected calcification. Skull: Normal. Negative for fracture or focal lesion. Sinuses/Orbits: Intermediate attenuation left maxillary sinus air-fluid level. Overlying irregularity of the maxillary sinus walls, of uncertain acuity (series 5, image 5) Other: None. CT CERVICAL SPINE FINDINGS Alignment: Normal. Skull base and vertebrae: No acute fracture. No primary bone lesion or focal pathologic process. Soft tissues  and spinal canal: No prevertebral fluid or swelling. No visible canal hematoma. Disc levels:  Intact. Upper chest: Negative. Other: None. IMPRESSION: 1. No acute intracranial pathology. 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 2. Intermediate attenuation left maxillary sinus air-fluid level. Overlying irregularity of the maxillary sinus walls, of uncertain acuity. Correlate for point tenderness and consider dedicated maxillofacial CT to further evaluate. 3. No fracture or static subluxation of the cervical spine. Electronically Signed   By: Fredricka Jenny M.D.    On: 01/18/2024 13:06    EKG: Independently reviewed. Sinus rhythm  Assessment/Plan Principal Problem:   Thoracic compression fracture (HCC) Active Problems:   Type II diabetes mellitus with complication (HCC)   Hypertension   Hemorrhagic cerebrovascular accident (CVA) (HCC)   Tricuspid regurgitation   Temporal arteritis (HCC)   Rheumatoid arthritis [facility])   Hospice care patient   # Left orbital and maxillary sinus fractures EDP discussed w/ ENT who advises nothing save for abx - start augmentin  - will f/u with ENT tomorrow  # T12 compression fracture Subacute. No sig pain - will touch base w/ neurosurg - norco prn, bowel regimen - PT consult, per EDP family is interested in snf  # AKI Mild, will monitor off fluids, give fluids as needed  # Manubrial fracture Normal troponins. Per EDP mgmt is conservative  # Fall - will image hips but no pain w/ log roll  # Hospice patient - will reach out to hospice tomorrow  # T2DM - SSI  # RA Doesn't appear to be treated  # Hx CVA - will hold asa until we confirm no surgery - cont statin  # Hiatal hernia Asymptomatic  # HTN Bp elevated - resume home hydrochlorothiazide , spiro  # MDD - home duloxetine , mirtazapine , sertraline   DVT prophylaxis: lovenox  Code Status: dnr/dni, has signed order with her  Family Communication: none at bedside  Consults called: none   Level of care: Med-Surg Status is: Observation    Raymonde Calico MD [facility] Pager [phone]  If 7PM-7AM, please contact night-coverage [email or web address] Password [facility]  01/18/2024, 6:09 PM

## 2024-01-18 NOTE — ED Notes (Signed)
Pt speaking with friend on phone.

## 2024-01-18 NOTE — ED Notes (Signed)
 Given ice chips, talkative, speaking on phone

## 2024-01-18 NOTE — ED Notes (Addendum)
 EDP at [facility], pending CK results.

## 2024-01-18 NOTE — Progress Notes (Signed)
 [facility] Liaison Note  This patient is a current Hospice patient with [facility].  [facility] will follow through discharge disposition.  Please call with any Hospice related questions or concerns.  [facility] Liaison [phone]

## 2024-01-18 NOTE — ED Notes (Signed)
 Admitting MD at [facility].

## 2024-01-18 NOTE — ED Notes (Signed)
 Back from imaging.

## 2024-01-19 DIAGNOSIS — S22080A Wedge compression fracture of T11-T12 vertebra, initial encounter for closed fracture: Principal | ICD-10-CM

## 2024-01-19 DIAGNOSIS — S22080D Wedge compression fracture of T11-T12 vertebra, subsequent encounter for fracture with routine healing: Secondary | ICD-10-CM | POA: Diagnosis not present

## 2024-01-19 DIAGNOSIS — R296 Repeated falls: Secondary | ICD-10-CM

## 2024-01-19 LAB — CBC
HCT: 30.5 % — ABNORMAL LOW (ref 36.0–46.0)
Hemoglobin: 9.2 g/dL — ABNORMAL LOW (ref 12.0–15.0)
MCH: 27.5 pg (ref 26.0–34.0)
MCHC: 30.2 g/dL (ref 30.0–36.0)
MCV: 91.3 fL (ref 80.0–100.0)
Platelets: 441 10*3/uL — ABNORMAL HIGH (ref 150–400)
RBC: 3.34 MIL/uL — ABNORMAL LOW (ref 3.87–5.11)
RDW: 20.4 % — ABNORMAL HIGH (ref 11.5–15.5)
WBC: 8.1 10*3/uL (ref 4.0–10.5)
nRBC: 0 % (ref 0.0–0.2)

## 2024-01-19 LAB — HEPATIC FUNCTION PANEL
ALT: 9 U/L (ref 0–44)
AST: 18 U/L (ref 15–41)
Albumin: 3.3 g/dL — ABNORMAL LOW (ref 3.5–5.0)
Alkaline Phosphatase: 89 U/L (ref 38–126)
Bilirubin, Direct: <0.1 mg/dL (ref 0.0–0.2)
Total Bilirubin: 0.5 mg/dL (ref 0.0–1.2)
Total Protein: 6.7 g/dL (ref 6.5–8.1)

## 2024-01-19 LAB — BASIC METABOLIC PANEL WITH GFR
Anion gap: 12 (ref 5–15)
BUN: 22 mg/dL (ref 8–23)
CO2: 25 mmol/L (ref 22–32)
Calcium: 9.3 mg/dL (ref 8.9–10.3)
Chloride: 97 mmol/L — ABNORMAL LOW (ref 98–111)
Creatinine, Ser: 1.31 mg/dL — ABNORMAL HIGH (ref 0.44–1.00)
GFR, Estimated: 41 mL/min — ABNORMAL LOW (ref >60)
Glucose, Bld: 116 mg/dL — ABNORMAL HIGH (ref 70–99)
Potassium: 4.3 mmol/L (ref 3.5–5.1)
Sodium: 134 mmol/L — ABNORMAL LOW (ref 135–145)

## 2024-01-19 LAB — GLUCOSE, CAPILLARY
Glucose-Capillary: 121 mg/dL — ABNORMAL HIGH (ref 70–99)
Glucose-Capillary: 120 mg/dL — ABNORMAL HIGH (ref 70–99)
Glucose-Capillary: 123 mg/dL — ABNORMAL HIGH (ref 70–99)
Glucose-Capillary: 112 mg/dL — ABNORMAL HIGH (ref 70–99)

## 2024-01-19 MED ORDER — ENSURE ENLIVE PO LIQD
237.0000 mL | Freq: Two times a day (BID) | ORAL | Status: DC
  Administered 2024-01-22 – 2024-01-23 (×2): 237 mL via ORAL

## 2024-01-19 MED ORDER — ONDANSETRON 4 MG PO TBDP
4.0000 mg | ORAL_TABLET | Freq: Four times a day (QID) | ORAL | Status: DC | PRN
  Administered 2024-01-19 – 2024-01-22 (×2): 4 mg via ORAL
  Filled 2024-01-19 (×4): qty 1

## 2024-01-19 MED ORDER — ASPIRIN 81 MG PO TBEC
81.0000 mg | DELAYED_RELEASE_TABLET | Freq: Every day | ORAL | Status: DC
  Administered 2024-01-19 – 2024-01-23 (×5): 81 mg via ORAL
  Filled 2024-01-19 (×5): qty 1

## 2024-01-19 MED ORDER — SODIUM CHLORIDE 0.9 % IV BOLUS
1000.0000 mL | Freq: Once | INTRAVENOUS | Status: AC
  Administered 2024-01-19: 1000 mL via INTRAVENOUS

## 2024-01-19 NOTE — Consult Note (Signed)
 Consult requested by:  Dr. Sari Cunning  Consult requested for:  T12 fracture  Primary Physician:  Pcp, No  History of Present Illness: 01/19/2024 [name] is here today with a chief complaint of worsening back pain. She had a fall a week ago.  She presented back to the ER with an additional fall and continued back pain.  She has a known T12 fracture.  She denies any new neurologic symptoms other than pain.  She does report that she has had some trouble dealing with her brace at home.  She lives alone and has a nephew who helps her at nighttime.  Past Surgery: n/a  Wanda Monroe has no symptoms of cervical myelopathy.  The symptoms are causing a significant impact on the patient's life.   I have utilized the care everywhere function in epic to review the outside records available from external health systems.  Review of Systems:  A 10 point review of systems is negative, except for the pertinent positives and negatives detailed in the HPI.  Past Medical History: Past Medical History:  Diagnosis Date   Anemia    Arthritis    rhuematoid,OSTEOARTHRITIS   Cellulitis    Diabetes mellitus without complication (HCC)    type II   GERD (gastroesophageal reflux disease)    Gout    Hiatal hernia    Hyperlipidemia    Hypertension    Neuromuscular disorder (HCC)    left foot neuropathy   Osteoporosis    Rosacea    Spinal stenosis of lumbar region    Stroke [facility])    x2, 9/10   Temporal arteritis (HCC)    Wears dentures    full top, partial bottom    Past Surgical History: Past Surgical History:  Procedure Laterality Date   ABDOMINAL HYSTERECTOMY     APPENDECTOMY     BACK SURGERY     lumbar and cervical laminectomies   BREAST BIOPSY Left 1960,1980   twice negative results   CAPSULOTOMY METATARSOPHALANGEAL Left 01/22/2015   Procedure: CAPSULOTOMY METATARSOPHALANGEAL;  Surgeon: Sharlyn Deaner, DPM;  Location: [facility];  Service: Podiatry;  Laterality: Left;    CATARACT EXTRACTION W/PHACO Left 10/19/2016   Procedure: CATARACT EXTRACTION PHACO AND INTRAOCULAR LENS PLACEMENT (IOC)  Left Eye  Diabetic;  Surgeon: Annell Kidney, MD;  Location: [facility];  Service: Ophthalmology;  Laterality: Left;  Diabetic - oral meds Left Eye   CATARACT EXTRACTION W/PHACO Right 11/16/2016   Procedure: CATARACT EXTRACTION PHACO AND INTRAOCULAR LENS PLACEMENT (IOC) dIABETIC;  Surgeon: Annell Kidney, MD;  Location: [facility];  Service: Ophthalmology;  Laterality: Right;  DIABETES - oral meds   COLONOSCOPY WITH PROPOFOL  N/A 04/24/2018   Procedure: COLONOSCOPY WITH PROPOFOL ;  Surgeon: Toledo, Alphonsus Jeans, MD;  Location: [facility];  Service: Gastroenterology;  Laterality: N/A;   ESOPHAGOGASTRODUODENOSCOPY N/A 07/01/2021   Procedure: ESOPHAGOGASTRODUODENOSCOPY (EGD);  Surgeon: Conrado Delay, DO;  Location: [facility];  Service: General;  Laterality: N/A;   ESOPHAGOGASTRODUODENOSCOPY (EGD) WITH PROPOFOL  N/A 04/24/2018   Procedure: ESOPHAGOGASTRODUODENOSCOPY (EGD) WITH PROPOFOL ;  Surgeon: Toledo, Alphonsus Jeans, MD;  Location: [facility];  Service: Gastroenterology;  Laterality: N/A;   FOOT SURGERY     HALLUX VALGUS AKIN Left 01/22/2015   Procedure: HALLUX VALGUS AKIN;  Surgeon: Sharlyn Deaner, DPM;  Location: [facility];  Service: Podiatry;  Laterality: Left;  left great toe   HEMORRHOID SURGERY     HIP ARTHROPLASTY Right 05/21/2023   Procedure: ARTHROPLASTY BIPOLAR HIP (HEMIARTHROPLASTY);  Surgeon:  Poggi, Kaylene Pascal, MD;  Location: [facility];  Service: Orthopedics;  Laterality: Right;   KNEE ARTHROSCOPY     LUMBAR LAMINECTOMY  04/23/13   NOSE SURGERY     TONSILLECTOMY     URETER SURGERY      Allergies: Allergies as of 01/18/2024 - Review Complete 01/18/2024  Allergen Reaction Noted   Codeine sulfate Nausea Only 01/15/2015   Remicade [infliximab] Other (See Comments) 01/15/2015    Medications: Current Meds  Medication Sig    acetaminophen  (TYLENOL ) 650 MG CR tablet Take 650 mg by mouth every 8 (eight) hours.   amLODipine (NORVASC) 5 MG tablet Take 5 mg by mouth daily.   apixaban  (ELIQUIS ) 2.5 MG TABS tablet Take 1 tablet (2.5 mg total) by mouth 2 (two) times daily.   ASPERCREME LIDOCAINE  4 % Place 1 patch onto the skin daily.   aspirin  81 MG tablet Take 81 mg by mouth daily. AM   atorvastatin  (LIPITOR) 80 MG tablet Take 80 mg by mouth daily. PM   bisacodyl  (DULCOLAX) 10 MG suppository Place 1 suppository (10 mg total) rectally daily as needed for moderate constipation.   calcium  citrate-vitamin D  (CITRACAL+D) 315-200 MG-UNIT per tablet Take 1 tablet by mouth daily.   cholecalciferol  (CHOLECALCIFEROL ) 25 MCG tablet Take 1 tablet (1,000 Units total) by mouth daily.   docusate sodium  (COLACE) 100 MG capsule Take 1 capsule (100 mg total) by mouth 2 (two) times daily.   DULoxetine  (CYMBALTA ) 30 MG capsule Take 30 mg by mouth daily.   ferrous sulfate  325 (65 FE) MG tablet Take 1 tablet (325 mg total) by mouth daily with breakfast.   folic acid  (FOLVITE ) 1 MG tablet Take 1 mg by mouth daily.   gabapentin  (NEURONTIN ) 100 MG capsule Take 200 mg by mouth 2 (two) times daily.   hydrochlorothiazide  (HYDRODIURIL ) 25 MG tablet Take 25 mg by mouth daily.   ipratropium-albuterol (DUONEB) 0.5-2.5 (3) MG/3ML SOLN Take 3 mLs by nebulization every 6 (six) hours as needed (breathing).   KLOR-CON  M20 20 MEQ tablet Take 20 mEq by mouth 2 (two) times daily.   latanoprost  (XALATAN ) 0.005 % ophthalmic solution Place 1 drop into both eyes at bedtime.   LORazepam (ATIVAN) 0.5 MG tablet Take 0.5 mg by mouth every 4 (four) hours as needed.   metFORMIN (GLUCOPHAGE) 500 MG tablet Take 500 mg by mouth 2 (two) times daily with a meal.   methotrexate  (RHEUMATREX) 2.5 MG tablet Take 12.5 mg by mouth once a week. Thursday   mirtazapine  (REMERON ) 15 MG tablet Take 15 mg by mouth at bedtime.   oxyCODONE  (OXY IR/ROXICODONE ) 5 MG immediate release tablet  Take 5 mg by mouth 2 (two) times daily as needed for severe pain or moderate pain.   pantoprazole  (PROTONIX ) 40 MG tablet Take 1 tablet (40 mg total) by mouth daily.   sennosides-docusate sodium  (SENOKOT-S) 8.6-50 MG tablet Take 1 tablet by mouth 2 (two) times daily.   sertraline  (ZOLOFT ) 50 MG tablet Take 50 mg by mouth at bedtime.   spironolactone  (ALDACTONE ) 25 MG tablet Take 25 mg by mouth daily.   timolol  (TIMOPTIC ) 0.5 % ophthalmic solution Place 1 drop into both eyes 2 (two) times daily.   tiZANidine  (ZANAFLEX ) 4 MG tablet Take 4 mg by mouth 3 (three) times daily as needed for muscle spasms.   vitamin B-12 (CYANOCOBALAMIN ) 1000 MCG tablet Take 1,000 mcg by mouth daily.    Social History: Social History   Tobacco Use   Smoking status: Never  Smokeless tobacco: Never  Vaping Use   Vaping status: Never Used  Substance Use Topics   Alcohol use: No   Drug use: Not Currently    Family Medical History: Family History  Problem Relation Age of Onset   Diabetes Father    Stroke Father    Breast cancer Neg Hx     Physical Examination: Vitals:   01/19/24 0407 01/19/24 0737  BP: (!) 144/59 139/65  Pulse: 79 75  Resp: 16 15  Temp: 99.5 F (37.5 C) 98.6 F (37 C)  SpO2: 97% 98%    General: Patient is in no apparent distress. Attention to examination is appropriate.  Neck:   Supple.  Full range of motion.  Respiratory: Patient is breathing without any difficulty.   NEUROLOGICAL:     Awake, alert, oriented.  Cranial Nerves: Pupils equal round and reactive to light.  Facial tone is symmetric.   Strength: Moves UE well   Side Iliopsoas Quads Hamstring PF DF EHL  R 5 5 5 5 5 5   L 5 5 5 5 5 5     Bilateral upper and lower extremity sensation is intact to light touch.    No evidence of dysmetria noted.  Gait is untested.     Medical Decision Making  Imaging: T spine MRI 01/18/2024 IMPRESSION: Acute/recent or unhealed T12 compression fracture with  associated marrow edema and T2 hyperintense cleft along the superior endplate. Associated 25% height loss and 3 mm bony retropulsion with mild resulting canal stenosis.     Electronically Signed   By: Stevenson Elbe M.D.   On: 01/18/2024 23:27  I have personally reviewed the images and agree with the above interpretation.  Assessment and Plan: Wanda Monroe is a pleasant 83 y.o. female with T12 compression fracture.  This is a stable fracture.  I recommended continued conservative management.  She should wear a TLSO brace when out of bed.  I will consider calling the orthotics technician back to go over how to put the brace on and off.  I have asked her to ask her nephew to bring the brace to the hospital.  Will arrange follow-up for her.    I have communicated my recommendations to the requesting physician and coordinated care to facilitate these recommendations.     Gresia Isidoro K. Mont Antis MD, [facility] Neurosurgery

## 2024-01-19 NOTE — Evaluation (Signed)
 Physical Therapy Evaluation Patient Details Name: Wanda Monroe MRN: [id number] DOB: Apr 13, 1941 Today's Date: 01/19/2024  History of Present Illness  Wanda Monroe is a 83 y.o. female with medical history significant for dementia, history hemorrhagic cva, htn, ra, dm, tr, hiatal hernia, on hospice.   Tripped and fell a week ago, seen in our ER, diagnosed with thoracic compression fracture ( T12). Presents with another trip and fall today, complaining of left facial pain and numbness. No cough. No lower extremity weakness/numbness. No fevers. No chest pain.   Clinical Impression  Patient received in recliner. She reports nausea but agrees to try mobility as she wants to go home. Neighbor arrived to bring back brace, but left. She requires total A to don TLSO, min A to just lean forward in recliner to get it behind her. Min A to stand from recliner and cga for ambulation of 4 feet. Patient unable to continue due to pain and nausea and sat on edge of bed. She was able to stand again with min A and step pivot back to recliner. Patient will require a good deal of assistance at home and is at increased fall risk. Patient will continue to benefit from skilled PT to improve strength, balance and safety with mobility.            If plan is discharge home, recommend the following: A lot of help with walking and/or transfers;A lot of help with bathing/dressing/bathroom;Assist for transportation;Help with stairs or ramp for entrance;Assistance with cooking/housework;Direct supervision/assist for medications management;Direct supervision/assist for financial management   Can travel by private vehicle   No    Equipment Recommendations None recommended by PT  Recommendations for Other Services       Functional Status Assessment Patient has had a recent decline in their functional status and demonstrates the ability to make significant improvements in function in a reasonable and predictable amount of time.      Precautions / Restrictions Precautions Precautions: Fall Recall of Precautions/Restrictions: Impaired Required Braces or Orthoses: Spinal Brace Spinal Brace: Thoracolumbosacral orthotic Restrictions Weight Bearing Restrictions Per Provider Order: No      Mobility  Bed Mobility               General bed mobility comments: NT patient in recliner    Transfers Overall transfer level: Needs assistance Equipment used: Rolling walker (2 wheels) Transfers: Sit to/from Stand Sit to Stand: Min assist                Ambulation/Gait Ambulation/Gait assistance: Min assist Gait Distance (Feet): 4 Feet Assistive device: Rolling walker (2 wheels) Gait Pattern/deviations: Step-to pattern, Decreased step length - right, Decreased step length - left, Decreased stride length Gait velocity: decr     General Gait Details: poor tolerance for mobility. Due to pain, nausea. Able to walk about 4 feet with cga then needed to sit down on bed.  Stairs            Wheelchair Mobility     Tilt Bed    Modified Rankin (Stroke Patients Only)       Balance Overall balance assessment: Needs assistance Sitting-balance support: Feet supported Sitting balance-Leahy Scale: Fair     Standing balance support: Bilateral upper extremity supported, During functional activity, Reliant on assistive device for balance Standing balance-Leahy Scale: Poor  Pertinent Vitals/Pain Pain Assessment Pain Assessment: 0-10 Pain Score: 10-Worst pain ever Pain Location: back, neck ( right side) Pain Descriptors / Indicators: Discomfort, Grimacing, Moaning Pain Intervention(s): Monitored during session, Premedicated before session, Repositioned    Home Living Family/patient expects to be discharged to:: Private residence Living Arrangements: Alone Available Help at Discharge: Friend(s);Available PRN/intermittently Type of Home: House Home Access:  Ramped entrance       Home Layout: One level Home Equipment: Cane - quad;Medical illustrator (2 wheels) Additional Comments: Nephew spends the night (lives next door) and friend lives/works nearby - states he comes any time she calls for help    Prior Function Prior Level of Function : Needs assist             Mobility Comments: Since last admission, patient has had difficulty ambulating, falls, pain ADLs Comments: friend drives pt to grocery store     Extremity/Trunk Assessment   Upper Extremity Assessment Upper Extremity Assessment: Generalized weakness    Lower Extremity Assessment Lower Extremity Assessment: Generalized weakness    Cervical / Trunk Assessment Cervical / Trunk Assessment: Other exceptions Cervical / Trunk Exceptions: limited by pain from T12 fracture and reported neck pain on right side since fall  Communication   Communication Communication: No apparent difficulties    Cognition Arousal: Alert Behavior During Therapy: WFL for tasks assessed/performed   PT - Cognitive impairments: No apparent impairments                         Following commands: Intact       Cueing Cueing Techniques: Verbal cues     General Comments      Exercises     Assessment/Plan    PT Assessment Patient needs continued PT services  PT Problem List Decreased strength;Decreased range of motion;Decreased activity tolerance;Decreased balance;Decreased mobility;Decreased cognition;Decreased knowledge of use of DME;Decreased safety awareness;Decreased knowledge of precautions;Pain       PT Treatment Interventions DME instruction;Gait training;Functional mobility training;Therapeutic activities;Therapeutic exercise;Balance training;Cognitive remediation;Patient/family education    PT Goals (Current goals can be found in the Care Plan section)  Acute Rehab PT Goals Patient Stated Goal: reduce pain PT Goal Formulation: With patient Time  For Goal Achievement: 02/02/24 Potential to Achieve Goals: Fair    Frequency Min 2X/week     Co-evaluation               AM-PAC PT "6 Clicks" Mobility  Outcome Measure Help needed turning from your back to your side while in a flat bed without using bedrails?: A Lot Help needed moving from lying on your back to sitting on the side of a flat bed without using bedrails?: A Lot Help needed moving to and from a bed to a chair (including a wheelchair)?: A Little Help needed standing up from a chair using your arms (e.g., wheelchair or bedside chair)?: A Little Help needed to walk in hospital room?: A Lot Help needed climbing 3-5 steps with a railing? : Total 6 Click Score: 13    End of Session Equipment Utilized During Treatment: Gait belt;Back brace Activity Tolerance: Patient limited by pain;Other (comment) (nausea) Patient left: in chair;with call bell/phone within reach;with chair alarm set Nurse Communication: Mobility status PT Visit Diagnosis: Other abnormalities of gait and mobility (R26.89);Muscle weakness (generalized) (M62.81);Difficulty in walking, not elsewhere classified (R26.2);Unsteadiness on feet (R26.81);Repeated falls (R29.6);Pain Pain - part of body:  (neck and back)    Time: 1005-1030 PT Time Calculation (min) (ACUTE  ONLY): 25 min   Charges:   PT Evaluation $PT Eval Moderate Complexity: 1 Mod PT Treatments $Gait Training: 8-22 mins PT General Charges $$ ACUTE PT VISIT: 1 Visit         Sameul Tagle, PT, GCS 01/19/24,10:46 AM

## 2024-01-19 NOTE — Care Management Obs Status (Signed)
 MEDICARE OBSERVATION STATUS NOTIFICATION   Patient Details  Name: Wanda Monroe MRN: [id number] Date of Birth: 20-Jul-1941   Medicare Observation Status Notification Given:  Rudolph Cost, CMA 01/19/2024, 12:27 PM

## 2024-01-19 NOTE — Evaluation (Signed)
 Occupational Therapy Evaluation Patient Details Name: Wanda Monroe MRN: [id number] DOB: 1941/09/03 Today's Date: 01/19/2024   History of Present Illness   Wanda Monroe is a 83 y.o. female with medical history significant for dementia, history hemorrhagic cva, htn, ra, dm, tr, hiatal hernia, on hospice.   Tripped and fell a week ago, seen in our ER, diagnosed with thoracic compression fracture ( T12). Presents with another trip and fall today, complaining of left facial pain and numbness. No cough. No lower extremity weakness/numbness. No fevers. No chest pain.     Clinical Impressions Pt was seen for OT evaluation this date. PTA, pt reports living alone and performing ADLs on her own, some IADLs with nephew staying overnight with her and a neighbor available PRN for driving, grocery shopping, etc. Pt with a recent fall and return home with increased difficulty with all tasks.  Pt presents to acute OT demonstrating impaired ADL performance and functional mobility 2/2 weakness, pain, balance deficits and low activity tolerance. Pt edu on back precautions and use of AE/AD during LB ADLs to maximize safety and protect her back. Edu on TLSO use and how to don/doff. Pt requires total assist to don brace and reports it is very uncomfortable. Min A needed for STS from recliner to RW with ability to ambulate around her bed ~8 feet with Min A. Pt fatigues very easily and is unable to progress further. Anticipate total assist for LB ADLs and Mod A for UB ADLs d/t significant weakness throughout. Mod A for sit to sidelying in bed with c/o sternum pain-nurse notified. Pillows placed for comfort in bed. Pt would benefit from skilled OT services to address noted impairments and functional limitations to maximize safety and independence while minimizing falls risk and caregiver burden. Do anticipate the need for follow up OT services upon acute hospital DC.      If plan is discharge home, recommend the  following:   A little help with walking and/or transfers;A lot of help with bathing/dressing/bathroom;Assistance with cooking/housework;Assist for transportation;Help with stairs or ramp for entrance     Functional Status Assessment   Patient has had a recent decline in their functional status and demonstrates the ability to make significant improvements in function in a reasonable and predictable amount of time.     Equipment Recommendations   Other (comment) (defer to next venue)     Recommendations for Other Services         Precautions/Restrictions   Precautions Precautions: Fall Recall of Precautions/Restrictions: Impaired Precaution/Restrictions Comments: assuming back precautions--no BLT Required Braces or Orthoses: Spinal Brace Spinal Brace: Thoracolumbosacral orthotic Restrictions Weight Bearing Restrictions Per Provider Order: No     Mobility Bed Mobility Overal bed mobility: Needs Assistance Bed Mobility: Sit to Sidelying         Sit to sidelying: Mod assist, Used rails, HOB elevated General bed mobility comments: edu on log roll technique and required Mod A to bring BLEs on to bed and assist for repositioning for comfort    Transfers Overall transfer level: Needs assistance Equipment used: Rolling walker (2 wheels) Transfers: Sit to/from Stand Sit to Stand: Min assist           General transfer comment: from recliner and able to ambulate around the bed to return to EOB ~8 feet using RW with Min/CGA for safety as pt fatigue easily      Balance Overall balance assessment: Needs assistance Sitting-balance support: Feet supported Sitting balance-Leahy Scale: Fair  Standing balance support: Bilateral upper extremity supported, During functional activity, Reliant on assistive device for balance Standing balance-Leahy Scale: Poor Standing balance comment: Min A and RW use to maintain balance                           ADL  either performed or assessed with clinical judgement   ADL Overall ADL's : Needs assistance/impaired                     Lower Body Dressing: Total assistance Lower Body Dressing Details (indicate cue type and reason): anticipate as simulated and pt is not able to perform figure four and was edu on no bending at back               General ADL Comments: total assist to don TLSO brace; based on weakness anticipate Mod A for UB ADLs and Max/Total assist for LB ADLs     Vision         Perception         Praxis         Pertinent Vitals/Pain Pain Assessment Pain Assessment: Faces Faces Pain Scale: Hurts even more Pain Location: sternum region with return to supine Pain Descriptors / Indicators: Discomfort, Grimacing, Moaning Pain Intervention(s): Monitored during session, Repositioned     Extremity/Trunk Assessment Upper Extremity Assessment Upper Extremity Assessment: Generalized weakness (less than full ROM d/t weakness on eval)   Lower Extremity Assessment Lower Extremity Assessment: Generalized weakness   Cervical / Trunk Assessment Cervical / Trunk Assessment: Other exceptions Cervical / Trunk Exceptions: recent T 12 compression fx; new facial fx from additional fall; new reports of sternum pain with return to supine   Communication Communication Communication: No apparent difficulties   Cognition Arousal: Alert Behavior During Therapy: WFL for tasks assessed/performed                                 Following commands: Intact       Cueing  General Comments   Cueing Techniques: Verbal cues  fatigues easily and limited by pain   Exercises Other Exercises Other Exercises: Edu on role of OT, back precautions, don/doffing TLSO, and importance of wearing brace to promote back healing. Other Exercises: Edu on use of AE/AD including long handled sponge, shoe horn and reacher to maximize IND/ease with LB ADLs with pt verbalizing  understanding.   Shoulder Instructions      Home Living Family/patient expects to be discharged to:: Private residence Living Arrangements: Alone Available Help at Discharge: Friend(s);Available PRN/intermittently Type of Home: House Home Access: Ramped entrance     Home Layout: One level               Home Equipment: Cane - quad;Toilet riser;BSC/3in1;Rolling Environmental consultant (2 wheels)   Additional Comments: Nephew spends the night (lives next door) and friend lives/works nearby - states he comes any time she calls for help      Prior Functioning/Environment Prior Level of Function : Needs assist             Mobility Comments: Since last admission, patient has had difficulty ambulating, falls, pain ADLs Comments: friend drives pt to grocery store    OT Problem List: Decreased strength;Pain;Impaired balance (sitting and/or standing);Decreased activity tolerance   OT Treatment/Interventions: Self-care/ADL training;Balance training;Therapeutic exercise;Therapeutic activities;DME and/or AE instruction;Patient/family education      OT Goals(Current  goals can be found in the care plan section)   Acute Rehab OT Goals Patient Stated Goal: improve pain and function OT Goal Formulation: With patient Time For Goal Achievement: 02/02/24 Potential to Achieve Goals: Fair ADL Goals Pt Will Perform Upper Body Bathing: with supervision;with contact guard assist;sitting Pt Will Perform Lower Body Dressing: with min assist;with adaptive equipment;sit to/from stand;sitting/lateral leans Pt Will Transfer to Toilet: with contact guard assist;regular height toilet;ambulating;grab bars   OT Frequency:  Min 2X/week    Co-evaluation              AM-PAC OT "6 Clicks" Daily Activity     Outcome Measure Help from another person eating meals?: None Help from another person taking care of personal grooming?: A Little Help from another person toileting, which includes using toliet,  bedpan, or urinal?: A Lot Help from another person bathing (including washing, rinsing, drying)?: A Lot Help from another person to put on and taking off regular upper body clothing?: A Lot Help from another person to put on and taking off regular lower body clothing?: A Lot 6 Click Score: 15   End of Session Equipment Utilized During Treatment: Rolling walker (2 wheels);Back brace Nurse Communication: Mobility status  Activity Tolerance: Patient tolerated treatment well Patient left: in bed;with call bell/phone within reach;with bed alarm set  OT Visit Diagnosis: Other abnormalities of gait and mobility (R26.89);Unsteadiness on feet (R26.81);Repeated falls (R29.6);Muscle weakness (generalized) (M62.81)                Time: 1610-9604 OT Time Calculation (min): 29 min Charges:  OT General Charges $OT Visit: 1 Visit OT Evaluation $OT Eval Moderate Complexity: 1 Mod OT Treatments $Therapeutic Activity: 8-22 mins Kaliya Shreiner, OTR/L 01/19/24, 3:55 PM  Raylin Diguglielmo E Zyden Suman 01/19/2024, 3:51 PM

## 2024-01-19 NOTE — Plan of Care (Signed)
   Problem: Coping: Goal: Ability to adjust to condition or change in health will improve Outcome: Progressing   Problem: Fluid Volume: Goal: Ability to maintain a balanced intake and output will improve Outcome: Progressing

## 2024-01-19 NOTE — Progress Notes (Addendum)
 [facility] liaison note:  Ms. Arquette was admitted to the hospital after several falls at home and with increasing mobility issues. She has made the decision at this time to revoke her hospice benefit in order to seek skilled rehab.   Please don't hesitate to reach out for any hospice related questions or concerns.  Dwane Gitelman, BSN, [facility] liaison [phone]

## 2024-01-19 NOTE — Progress Notes (Signed)
 PROGRESS NOTE    Wanda Monroe  NWG:[id number] DOB: 1941/03/13 DOA: 01/18/2024 PCP: Pcp, No      Brief Narrative:   Wanda Monroe is a 83 y.o. female with medical history significant for dementia, history hemorrhagic cva, htn, ra, dm, tr, hiatal hernia, on hospice, who presents with the above.   Tripped and fell a week ago, seen in our ER, diagnosed with thoracic compression fracture. Presents with another trip and fall today, complaining of left facial pain and numbness. No cough. No lower extremity weakness/numbness. No fevers. No chest pain.    Assessment & Plan:   Principal Problem:   Thoracic compression fracture (HCC) Active Problems:   Type II diabetes mellitus with complication (HCC)   Hypertension   Hemorrhagic cerebrovascular accident (CVA) (HCC)   Tricuspid regurgitation   Temporal arteritis (HCC)   Rheumatoid arthritis (HCC)   Hospice care patient  # Left orbital and maxillary sinus fractures Dr Donnie Galea advises f/u 1 week, abs - cont augmentin  - ENT f/u 1 week   # T12 compression fracture Subacute. No sig pain. MRI shows to be stable fracture - TLSO brace when OOB - PT advising snf, patient is agreeable, toc aware - outpt neurosurg f/u   # AKI Mild, will give a 1 liter bolus today  # Loose stool New today, did get stool softeners yesterday and today - hold bowel regimen - further w/u if persists   # Manubrial fracture Normal troponins. No signs pnemo or other complication on CT. Mgmt is conservative   # Hospice patient - hospice following here, will revoke hospice temporarily for skilled nursing   # T2DM - SSI   # RA Doesn't appear to be treated   # Hx CVA - resume asa - cont statin   # Hiatal hernia Asymptomatic   # HTN Bp improved with resumption of home meds - cont home hydrochlorothiazide , spiro   # MDD - home duloxetine , mirtazapine , sertraline    DVT prophylaxis: lovenox  Code Status: dnr/dni Family Communication: hcpoa rodney  updated telephonically 5/9  Level of care: Med-Surg Status is: Observation    Consultants:  neurosurgery  Procedures: none  Antimicrobials:  none    Subjective: Some nausea today, no vomiting. Stable pain  Objective: Vitals:   01/18/24 2000 01/18/24 2106 01/19/24 0407 01/19/24 0737  BP: (!) 145/65 (!) 168/61 (!) 144/59 139/65  Pulse: 93 88 79 75  Resp:  18 16 15   Temp:  99.2 F (37.3 C) 99.5 F (37.5 C) 98.6 F (37 C)  TempSrc:  Oral  Oral  SpO2: 100% 94% 97% 98%  Weight:  55.2 kg    Height:  5\' 7"  (1.702 m)      Intake/Output Summary (Last 24 hours) at 01/19/2024 1106 Last data filed at 01/18/2024 1636 Gross per 24 hour  Intake 500 ml  Output --  Net 500 ml   Filed Weights   01/18/24 1208 01/18/24 2106  Weight: 58.1 kg 55.2 kg    Examination:  Constitutional: No acute distress Head: no apparent facial trauma Eyes: Conjunctiva clear ENM: poor dentition  Neck: Supple Respiratory: Clear to auscultation bilaterally, save for rales at bases Cardiovascular: Regular rate and rhythm. Systolic murmur Abdomen: Non-tender, soft.   Musculoskeletal: No joint deformity upper and lower extremities.   Skin: No rashes, lesions, or ulcers.  Extremities: No peripheral edema. warm Neurologic: Alert, moving all 4 extremities. Psychiatric: mild confusion    Data Reviewed: I have personally reviewed following labs and imaging studies  CBC: Recent Labs  Lab 01/18/24 1215 01/19/24 0324  WBC 8.6 8.1  NEUTROABS 7.2  --   HGB 9.3* 9.2*  HCT 30.1* 30.5*  MCV 90.1 91.3  PLT 457* 441*   Basic Metabolic Panel: Recent Labs  Lab 01/18/24 1215 01/19/24 0324  NA 133* 134*  K 4.4 4.3  CL 96* 97*  CO2 25 25  GLUCOSE 110* 116*  BUN 21 22  CREATININE 1.33* 1.31*  CALCIUM  9.5 9.3   GFR: Estimated Creatinine Clearance: 29.3 mL/min (A) (by C-G formula based on SCr of 1.31 mg/dL (H)). Liver Function Tests: Recent Labs  Lab 01/18/24 1215  AST 20  ALT 10  ALKPHOS  91  BILITOT 0.5  PROT 7.1  ALBUMIN 3.6   No results for input(s): "LIPASE", "AMYLASE" in the last 168 hours. No results for input(s): "AMMONIA" in the last 168 hours. Coagulation Profile: No results for input(s): "INR", "PROTIME" in the last 168 hours. Cardiac Enzymes: Recent Labs  Lab 01/18/24 1215  CKTOTAL 56   BNP (last 3 results) No results for input(s): "PROBNP" in the last 8760 hours. HbA1C: Recent Labs    01/18/24 1215  HGBA1C 6.3*   CBG: Recent Labs  Lab 01/18/24 2134 01/19/24 0736  GLUCAP 143* 121*   Lipid Profile: No results for input(s): "CHOL", "HDL", "LDLCALC", "TRIG", "CHOLHDL", "LDLDIRECT" in the last 72 hours. Thyroid  Function Tests: No results for input(s): "TSH", "T4TOTAL", "FREET4", "T3FREE", "THYROIDAB" in the last 72 hours. Anemia Panel: No results for input(s): "VITAMINB12", "FOLATE", "FERRITIN", "TIBC", "IRON ", "RETICCTPCT" in the last 72 hours. Urine analysis:    Component Value Date/Time   COLORURINE YELLOW (A) 05/20/2023 2137   APPEARANCEUR CLEAR (A) 05/20/2023 2137   LABSPEC 1.023 05/20/2023 2137   PHURINE 5.0 05/20/2023 2137   GLUCOSEU NEGATIVE 05/20/2023 2137   HGBUR NEGATIVE 05/20/2023 2137   BILIRUBINUR NEGATIVE 05/20/2023 2137   KETONESUR NEGATIVE 05/20/2023 2137   PROTEINUR NEGATIVE 05/20/2023 2137   NITRITE NEGATIVE 05/20/2023 2137   LEUKOCYTESUR NEGATIVE 05/20/2023 2137   Sepsis Labs: @LABRCNTIP (procalcitonin:4,lacticidven:4)  )No results found for this or any previous visit (from the past 240 hours).       Radiology Studies: MR THORACIC SPINE WO CONTRAST Result Date: 01/18/2024 CLINICAL DATA:  t12 compression fracture. EXAM: MRI THORACIC SPINE WITHOUT CONTRAST TECHNIQUE: Multiplanar, multisequence MR imaging of the thoracic spine was performed. No intravenous contrast was administered. COMPARISON:  Same day CT of the thoracic spine. FINDINGS: Alignment:  No substantial sagittal subluxation. Vertebrae: Acute/recent or  unhealed T12 compression fracture with associated marrow edema and T2 hyperintense cleft along the superior endplate. Associated 25% height loss. Mild (3 mm) bony retropulsion with mild resulting canal stenosis. Cord:  Normal cord signal. Paraspinal and other soft tissues: Partially imaged large hiatal hernia. Disc levels: Mild canal stenosis at T12 as detailed above. Otherwise, no significant canal or foraminal stenosis. IMPRESSION: Acute/recent or unhealed T12 compression fracture with associated marrow edema and T2 hyperintense cleft along the superior endplate. Associated 25% height loss and 3 mm bony retropulsion with mild resulting canal stenosis. Electronically Signed   By: Stevenson Elbe M.D.   On: 01/18/2024 23:27   DG Pelvis 1-2 Views Result Date: 01/18/2024 CLINICAL DATA:  Recent fall with pelvic pain, initial encounter EXAM: PELVIS - 1 VIEW COMPARISON:  CT from earlier in the same day. FINDINGS: Pelvic ring is intact. Diffuse vascular calcifications are seen. No acute abnormality is noted. Prior right hip replacement is noted. IMPRESSION: No acute abnormality noted. Electronically Signed  By: Violeta Grey M.D.   On: 01/18/2024 19:15   CT L-SPINE NO CHARGE Result Date: 01/18/2024 CLINICAL DATA:  Fall and back pain.  Left facial numbness. EXAM: CT THORACIC AND LUMBAR SPINE WITHOUT CONTRAST TECHNIQUE: Multidetector CT imaging of the thoracic and lumbar spine was performed without contrast. Multiplanar CT image reconstructions were also generated. RADIATION DOSE REDUCTION: This exam was performed according to the departmental dose-optimization program which includes automated exposure control, adjustment of the mA and/or kV according to patient size and/or use of iterative reconstruction technique. COMPARISON:  Chest radiograph dated 01/18/2024 and CT abdomen pelvis dated 01/11/2024. FINDINGS: CT THORACIC SPINE FINDINGS Alignment: No acute subluxation. Vertebrae: Progression of T12 superior endplate  compression fracture compared to prior CT with extension of the fracture to the anterior vertebral body. There is approximately 10% loss of vertebral body height. There is a 4 mm retropulsion of the posterosuperior endplate. No other acute thoracic spine fracture. The bones are osteopenic. Paraspinal and other soft tissues: Mild standing adjacent to T12. No paraspinal fluid collection or hematoma. Disc levels: Multilevel degenerative changes. CT LUMBAR SPINE FINDINGS Segmentation: 5 lumbar type vertebrae. Alignment: No acute subluxation. Vertebrae: No acute fracture.  Osteopenia. Paraspinal and other soft tissues: No acute findings. Disc levels: Multilevel degenerative changes and disc desiccation and vacuum phenomena. IMPRESSION: 1. Progression of T12 superior endplate compression fracture since the prior CT with extension of the fracture to the anterior vertebral body. There is a 4 mm retropulsion of the posterior superior endplate. 2. No acute/traumatic lumbar spine pathology. Electronically Signed   By: Angus Bark M.D.   On: 01/18/2024 17:16   CT T-SPINE NO CHARGE Result Date: 01/18/2024 CLINICAL DATA:  Fall and back pain.  Left facial numbness. EXAM: CT THORACIC AND LUMBAR SPINE WITHOUT CONTRAST TECHNIQUE: Multidetector CT imaging of the thoracic and lumbar spine was performed without contrast. Multiplanar CT image reconstructions were also generated. RADIATION DOSE REDUCTION: This exam was performed according to the departmental dose-optimization program which includes automated exposure control, adjustment of the mA and/or kV according to patient size and/or use of iterative reconstruction technique. COMPARISON:  Chest radiograph dated 01/18/2024 and CT abdomen pelvis dated 01/11/2024. FINDINGS: CT THORACIC SPINE FINDINGS Alignment: No acute subluxation. Vertebrae: Progression of T12 superior endplate compression fracture compared to prior CT with extension of the fracture to the anterior vertebral  body. There is approximately 10% loss of vertebral body height. There is a 4 mm retropulsion of the posterosuperior endplate. No other acute thoracic spine fracture. The bones are osteopenic. Paraspinal and other soft tissues: Mild standing adjacent to T12. No paraspinal fluid collection or hematoma. Disc levels: Multilevel degenerative changes. CT LUMBAR SPINE FINDINGS Segmentation: 5 lumbar type vertebrae. Alignment: No acute subluxation. Vertebrae: No acute fracture.  Osteopenia. Paraspinal and other soft tissues: No acute findings. Disc levels: Multilevel degenerative changes and disc desiccation and vacuum phenomena. IMPRESSION: 1. Progression of T12 superior endplate compression fracture since the prior CT with extension of the fracture to the anterior vertebral body. There is a 4 mm retropulsion of the posterior superior endplate. 2. No acute/traumatic lumbar spine pathology. Electronically Signed   By: Angus Bark M.D.   On: 01/18/2024 17:16   CT CHEST ABDOMEN PELVIS W CONTRAST Result Date: 01/18/2024 CLINICAL DATA:  Fall today. EXAM: CT CHEST, ABDOMEN, AND PELVIS WITH CONTRAST TECHNIQUE: Multidetector CT imaging of the chest, abdomen and pelvis was performed following the standard protocol during bolus administration of intravenous contrast. RADIATION DOSE REDUCTION: This exam was  performed according to the departmental dose-optimization program which includes automated exposure control, adjustment of the mA and/or kV according to patient size and/or use of iterative reconstruction technique. CONTRAST:  75mL OMNIPAQUE  IOHEXOL  300 MG/ML  SOLN COMPARISON:  Jan 11, 2024. FINDINGS: CT CHEST FINDINGS Cardiovascular: Atherosclerosis of thoracic aorta without aneurysm or dissection. Normal cardiac size. Coronary artery calcifications are noted. No pericardial effusion. Mediastinum/Nodes: Large sliding-type hiatal hernia. No adenopathy. Thyroid  gland is unremarkable. Lungs/Pleura: No pneumothorax or pleural  effusion is noted. Probable mild atelectasis or scarring is noted in right middle lobe and lingular segment of left upper lobe. Musculoskeletal: Probable mildly displaced manubrial fracture is noted. Probable mild depression of superior endplate of T12 vertebral body concerning for acute to subacute fracture as noted on prior CT scan. CT ABDOMEN PELVIS FINDINGS Hepatobiliary: No focal liver abnormality is seen. No gallstones, gallbladder wall thickening, or biliary dilatation. Pancreas: Unremarkable. No pancreatic ductal dilatation or surrounding inflammatory changes. Spleen: Normal in size without focal abnormality. Adrenals/Urinary Tract: Adrenal glands are unremarkable. Kidneys are normal, without renal calculi, focal lesion, or hydronephrosis. Bladder is unremarkable. Stomach/Bowel: Stomach is unremarkable. There is no evidence of bowel obstruction or inflammation. Status post appendectomy. Vascular/Lymphatic: Aortic atherosclerosis. No enlarged abdominal or pelvic lymph nodes. Reproductive: Status post hysterectomy. No adnexal masses. Other: No ascites or hernia is noted. Musculoskeletal: Status post right total hip arthroplasty. Multilevel degenerative changes are noted in lumbar spine. IMPRESSION: Mildly displaced manubrial fracture is noted. Mild superior endplate depression of T12 vertebral body is noted which was present on prior exam of Jan 11, 2024, and is concerning for acute to subacute compression fracture. Large sliding-type hiatal hernia. Coronary artery calcifications are noted suggesting coronary artery disease. Aortic Atherosclerosis (ICD10-I70.0). Electronically Signed   By: Rosalene Colon M.D.   On: 01/18/2024 16:56   CT Maxillofacial Wo Contrast Result Date: 01/18/2024 CLINICAL DATA:  Trauma.  Fall. EXAM: CT MAXILLOFACIAL WITHOUT CONTRAST TECHNIQUE: Multidetector CT imaging of the maxillofacial structures was performed. Multiplanar CT image reconstructions were also generated. RADIATION DOSE  REDUCTION: This exam was performed according to the departmental dose-optimization program which includes automated exposure control, adjustment of the mA and/or kV according to patient size and/or use of iterative reconstruction technique. COMPARISON:  None Available. FINDINGS: Osseous: There is a left inferior orbital wall fracture, minimally depressed/displaced. There is some fat herniation into the sinus. There is also acute nondisplaced fracture of the lateral wall the left maxillary sinus in minimally depressed acute fracture of the anterior wall of the left maxillary sinus. There is also subtle nondisplaced fracture of the lateral left orbital wall. There is no dislocation. Orbits: There is mild orbital edema inferiorly. No evidence for muscle entrapment or focal hematoma. The globes appear intact bilaterally. No foreign body Sinuses: There is a hyperdense air-fluid level in the left maxillary sinus. Soft tissues: No focal hematoma or foreign body. Limited intracranial: No significant or unexpected finding. IMPRESSION: 1. Acute fractures of the left inferior orbital wall, anterior wall of the left maxillary sinus, and lateral wall of the left orbit. 2. Hyperdense air-fluid level in the left maxillary sinus compatible with hemorrhage. Electronically Signed   By: Tyron Gallon M.D.   On: 01/18/2024 16:51   DG Chest 2 View Result Date: 01/18/2024 CLINICAL DATA:  Provided history: Fall. Chest pain. Additional history provided: Left lower rib pain after fall this morning. EXAM: CHEST - 2 VIEW COMPARISON:  Prior chest radiographs 01/11/2024 and earlier. Lumbar spine CT 01/11/2024. FINDINGS: Heart size within  normal limits. Aortic atherosclerosis. Large hiatal hernia. No appreciable airspace consolidation. No evidence of pleural effusion or pneumothorax. Anterior wedge vertebral compression fracture within the lower thoracic or upper lumbar spine (with up to 40% vertebral body height loss), new or progressed since  the lumbar spine CT of 01/11/2024. No other acute osseous abnormality is identified. IMPRESSION: 1. No evidence of an acute cardiopulmonary abnormality. 2. Vertebral compression fracture within the lower thoracic or upper lumbar spine (with up to 40% vertebral body height loss), new or progressed since the lumbar spine CT of 01/11/2024. Dedicated spinal imaging recommended. 3. Please note, this examination is not specifically tailored for the assessment of the ribs. Rib radiographs may be obtained for further evaluation, as clinically warranted. 4. Large hiatal hernia. 5. Aortic Atherosclerosis (ICD10-I70.0). Electronically Signed   By: Bascom Lily D.O.   On: 01/18/2024 13:21   CT HEAD WO CONTRAST ( ) Result Date: 01/18/2024 CLINICAL DATA:  Head and neck trauma EXAM: CT HEAD WITHOUT CONTRAST CT CERVICAL SPINE WITHOUT CONTRAST TECHNIQUE: Multidetector CT imaging of the head and cervical spine was performed following the standard protocol without intravenous contrast. Multiplanar CT image reconstructions of the cervical spine were also generated. RADIATION DOSE REDUCTION: This exam was performed according to the departmental dose-optimization program which includes automated exposure control, adjustment of the mA and/or kV according to patient size and/or use of iterative reconstruction technique. COMPARISON:  01/11/2024 FINDINGS: CT HEAD FINDINGS Brain: No evidence of acute infarction, hemorrhage, hydrocephalus, extra-axial collection or mass lesion/mass effect. Periventricular and deep white matter hypodensity. Unchanged right occipital and right cerebellar hemisphere encephalomalacia. Vascular: No hyperdense vessel or unexpected calcification. Skull: Normal. Negative for fracture or focal lesion. Sinuses/Orbits: Intermediate attenuation left maxillary sinus air-fluid level. Overlying irregularity of the maxillary sinus walls, of uncertain acuity (series 5, image 5) Other: None. CT CERVICAL SPINE FINDINGS  Alignment: Normal. Skull base and vertebrae: No acute fracture. No primary bone lesion or focal pathologic process. Soft tissues and spinal canal: No prevertebral fluid or swelling. No visible canal hematoma. Disc levels:  Intact. Upper chest: Negative. Other: None. IMPRESSION: 1. No acute intracranial pathology. 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 2. Intermediate attenuation left maxillary sinus air-fluid level. Overlying irregularity of the maxillary sinus walls, of uncertain acuity. Correlate for point tenderness and consider dedicated maxillofacial CT to further evaluate. 3. No fracture or static subluxation of the cervical spine. Electronically Signed   By: Fredricka Jenny M.D.   On: 01/18/2024 13:06   CT Cervical Spine Wo Contrast Result Date: 01/18/2024 CLINICAL DATA:  Head and neck trauma EXAM: CT HEAD WITHOUT CONTRAST CT CERVICAL SPINE WITHOUT CONTRAST TECHNIQUE: Multidetector CT imaging of the head and cervical spine was performed following the standard protocol without intravenous contrast. Multiplanar CT image reconstructions of the cervical spine were also generated. RADIATION DOSE REDUCTION: This exam was performed according to the departmental dose-optimization program which includes automated exposure control, adjustment of the mA and/or kV according to patient size and/or use of iterative reconstruction technique. COMPARISON:  01/11/2024 FINDINGS: CT HEAD FINDINGS Brain: No evidence of acute infarction, hemorrhage, hydrocephalus, extra-axial collection or mass lesion/mass effect. Periventricular and deep white matter hypodensity. Unchanged right occipital and right cerebellar hemisphere encephalomalacia. Vascular: No hyperdense vessel or unexpected calcification. Skull: Normal. Negative for fracture or focal lesion. Sinuses/Orbits: Intermediate attenuation left maxillary sinus air-fluid level. Overlying irregularity of the  maxillary sinus walls, of uncertain acuity (series 5, image 5) Other: None. CT CERVICAL SPINE  FINDINGS Alignment: Normal. Skull base and vertebrae: No acute fracture. No primary bone lesion or focal pathologic process. Soft tissues and spinal canal: No prevertebral fluid or swelling. No visible canal hematoma. Disc levels:  Intact. Upper chest: Negative. Other: None. IMPRESSION: 1. No acute intracranial pathology. 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 2. Intermediate attenuation left maxillary sinus air-fluid level. Overlying irregularity of the maxillary sinus walls, of uncertain acuity. Correlate for point tenderness and consider dedicated maxillofacial CT to further evaluate. 3. No fracture or static subluxation of the cervical spine. Electronically Signed   By: Fredricka Jenny M.D.   On: 01/18/2024 13:06        Scheduled Meds:  amoxicillin -clavulanate  1 tablet Oral BID   atorvastatin   80 mg Oral Daily   DULoxetine   30 mg Oral Daily   enoxaparin  (LOVENOX ) injection  40 mg Subcutaneous Q24H   feeding supplement  237 mL Oral BID BM   hydrochlorothiazide   25 mg Oral Daily   insulin  aspart  0-5 Units Subcutaneous QHS   insulin  aspart  0-9 Units Subcutaneous TID WC   latanoprost   1 drop Both Eyes QHS   mirtazapine   15 mg Oral QHS   sertraline   50 mg Oral QHS   sodium chloride  flush  3 mL Intravenous Q12H   spironolactone   25 mg Oral Daily   timolol   1 drop Both Eyes BID   Continuous Infusions:  sodium chloride        LOS: 0 days     Raymonde Calico, MD [facility]   If 7PM-7AM, please contact night-coverage [email or web address] Password [email or web address] 01/19/2024, 11:06 AM

## 2024-01-19 NOTE — Plan of Care (Signed)
 Problem: Education: Goal: Ability to describe self-care measures that may prevent or decrease complications (Diabetes Survival Skills Education) will improve 01/19/2024 0357 by Ysidro Her, RN Outcome: Progressing 01/19/2024 0356 by Ysidro Her, RN Outcome: Progressing   Problem: Coping: Goal: Ability to adjust to condition or change in health will improve 01/19/2024 0357 by Ysidro Her, RN Outcome: Progressing 01/19/2024 0356 by Ysidro Her, RN Outcome: Progressing   Problem: Fluid Volume: Goal: Ability to maintain a balanced intake and output will improve 01/19/2024 0357 by Ysidro Her, RN Outcome: Progressing 01/19/2024 0356 by Ysidro Her, RN Outcome: Progressing   Problem: Health Behavior/Discharge Planning: Goal: Ability to identify and utilize available resources and services will improve 01/19/2024 0357 by Ysidro Her, RN Outcome: Progressing 01/19/2024 0356 by Ysidro Her, RN Outcome: Progressing Goal: Ability to manage health-related needs will improve 01/19/2024 0357 by Ysidro Her, RN Outcome: Progressing 01/19/2024 0356 by Ysidro Her, RN Outcome: Progressing   Problem: Metabolic: Goal: Ability to maintain appropriate glucose levels will improve 01/19/2024 0357 by Ysidro Her, RN Outcome: Progressing 01/19/2024 0356 by Ysidro Her, RN Outcome: Progressing   Problem: Nutritional: Goal: Maintenance of adequate nutrition will improve 01/19/2024 0357 by Ysidro Her, RN Outcome: Progressing 01/19/2024 0356 by Ysidro Her, RN Outcome: Progressing Goal: Progress toward achieving an optimal weight will improve 01/19/2024 0357 by Ysidro Her, RN Outcome: Progressing 01/19/2024 0356 by Ysidro Her, RN Outcome: Progressing   Problem: Skin Integrity: Goal: Risk for impaired skin integrity will decrease 01/19/2024 0357 by Ysidro Her, RN Outcome: Progressing 01/19/2024 0356 by  Ysidro Her, RN Outcome: Progressing   Problem: Tissue Perfusion: Goal: Adequacy of tissue perfusion will improve 01/19/2024 0357 by Ysidro Her, RN Outcome: Progressing 01/19/2024 0356 by Ysidro Her, RN Outcome: Progressing   Problem: Education: Goal: Knowledge of General Education information will improve Description: Including pain rating scale, medication(s)/side effects and non-pharmacologic comfort measures 01/19/2024 0357 by Ysidro Her, RN Outcome: Progressing 01/19/2024 0356 by Ysidro Her, RN Outcome: Progressing   Problem: Health Behavior/Discharge Planning: Goal: Ability to manage health-related needs will improve 01/19/2024 0357 by Ysidro Her, RN Outcome: Progressing 01/19/2024 0356 by Ysidro Her, RN Outcome: Progressing   Problem: Clinical Measurements: Goal: Ability to maintain clinical measurements within normal limits will improve 01/19/2024 0357 by Ysidro Her, RN Outcome: Progressing 01/19/2024 0356 by Ysidro Her, RN Outcome: Progressing Goal: Will remain free from infection 01/19/2024 0357 by Ysidro Her, RN Outcome: Progressing 01/19/2024 0356 by Ysidro Her, RN Outcome: Progressing Goal: Diagnostic test results will improve 01/19/2024 0357 by Ysidro Her, RN Outcome: Progressing 01/19/2024 0356 by Ysidro Her, RN Outcome: Progressing Goal: Respiratory complications will improve 01/19/2024 0357 by Ysidro Her, RN Outcome: Progressing 01/19/2024 0356 by Ysidro Her, RN Outcome: Progressing Goal: Cardiovascular complication will be avoided 01/19/2024 0357 by Ysidro Her, RN Outcome: Progressing 01/19/2024 0356 by Ysidro Her, RN Outcome: Progressing   Problem: Activity: Goal: Risk for activity intolerance will decrease 01/19/2024 0357 by Ysidro Her, RN Outcome: Progressing 01/19/2024 0356 by Ysidro Her, RN Outcome: Progressing   Problem:  Nutrition: Goal: Adequate nutrition will be maintained 01/19/2024 0357 by Ysidro Her, RN Outcome: Progressing 01/19/2024 0356 by Ysidro Her, RN Outcome: Progressing   Problem: Coping: Goal: Level of anxiety will decrease 01/19/2024 0357 by Ysidro Her, RN Outcome: Progressing 01/19/2024 0356 by Broadus Canes  Stacia Dynes, RN Outcome: Progressing   Problem: Elimination: Goal: Will not experience complications related to bowel motility 01/19/2024 0357 by Ysidro Her, RN Outcome: Progressing 01/19/2024 0356 by Ysidro Her, RN Outcome: Progressing Goal: Will not experience complications related to urinary retention 01/19/2024 0357 by Ysidro Her, RN Outcome: Progressing 01/19/2024 0356 by Ysidro Her, RN Outcome: Progressing   Problem: Pain Managment: Goal: General experience of comfort will improve and/or be controlled 01/19/2024 0357 by Ysidro Her, RN Outcome: Progressing 01/19/2024 0356 by Ysidro Her, RN Outcome: Progressing   Problem: Safety: Goal: Ability to remain free from injury will improve 01/19/2024 0357 by Ysidro Her, RN Outcome: Progressing 01/19/2024 0356 by Ysidro Her, RN Outcome: Progressing   Problem: Skin Integrity: Goal: Risk for impaired skin integrity will decrease 01/19/2024 0357 by Ysidro Her, RN Outcome: Progressing 01/19/2024 0356 by Ysidro Her, RN Outcome: Progressing

## 2024-01-20 DIAGNOSIS — J449 Chronic obstructive pulmonary disease, unspecified: Secondary | ICD-10-CM | POA: Diagnosis not present

## 2024-01-20 DIAGNOSIS — Z8673 Personal history of transient ischemic attack (TIA), and cerebral infarction without residual deficits: Secondary | ICD-10-CM | POA: Diagnosis not present

## 2024-01-20 DIAGNOSIS — F329 Major depressive disorder, single episode, unspecified: Secondary | ICD-10-CM | POA: Diagnosis not present

## 2024-01-20 DIAGNOSIS — K449 Diaphragmatic hernia without obstruction or gangrene: Secondary | ICD-10-CM | POA: Diagnosis not present

## 2024-01-20 DIAGNOSIS — M069 Rheumatoid arthritis, unspecified: Secondary | ICD-10-CM | POA: Diagnosis not present

## 2024-01-20 DIAGNOSIS — S2221XA Fracture of manubrium, initial encounter for closed fracture: Secondary | ICD-10-CM | POA: Diagnosis not present

## 2024-01-20 DIAGNOSIS — S0232XA Fracture of orbital floor, left side, initial encounter for closed fracture: Secondary | ICD-10-CM | POA: Diagnosis not present

## 2024-01-20 DIAGNOSIS — I129 Hypertensive chronic kidney disease with stage 1 through stage 4 chronic kidney disease, or unspecified chronic kidney disease: Secondary | ICD-10-CM | POA: Diagnosis not present

## 2024-01-20 DIAGNOSIS — E118 Type 2 diabetes mellitus with unspecified complications: Secondary | ICD-10-CM | POA: Diagnosis not present

## 2024-01-20 DIAGNOSIS — S0240DA Maxillary fracture, left side, initial encounter for closed fracture: Secondary | ICD-10-CM | POA: Diagnosis not present

## 2024-01-20 DIAGNOSIS — I776 Arteritis, unspecified: Secondary | ICD-10-CM | POA: Diagnosis not present

## 2024-01-20 DIAGNOSIS — N179 Acute kidney failure, unspecified: Secondary | ICD-10-CM | POA: Diagnosis not present

## 2024-01-20 DIAGNOSIS — S22080D Wedge compression fracture of T11-T12 vertebra, subsequent encounter for fracture with routine healing: Secondary | ICD-10-CM | POA: Diagnosis not present

## 2024-01-20 DIAGNOSIS — S22080A Wedge compression fracture of T11-T12 vertebra, initial encounter for closed fracture: Secondary | ICD-10-CM | POA: Diagnosis not present

## 2024-01-20 DIAGNOSIS — S0292XA Unspecified fracture of facial bones, initial encounter for closed fracture: Secondary | ICD-10-CM | POA: Diagnosis present

## 2024-01-20 DIAGNOSIS — W010XXA Fall on same level from slipping, tripping and stumbling without subsequent striking against object, initial encounter: Secondary | ICD-10-CM | POA: Diagnosis not present

## 2024-01-20 DIAGNOSIS — N1831 Chronic kidney disease, stage 3a: Secondary | ICD-10-CM | POA: Diagnosis not present

## 2024-01-20 LAB — BASIC METABOLIC PANEL WITH GFR
Anion gap: 7 (ref 5–15)
BUN: 27 mg/dL — ABNORMAL HIGH (ref 8–23)
CO2: 27 mmol/L (ref 22–32)
Calcium: 8.8 mg/dL — ABNORMAL LOW (ref 8.9–10.3)
Chloride: 98 mmol/L (ref 98–111)
Creatinine, Ser: 1.21 mg/dL — ABNORMAL HIGH (ref 0.44–1.00)
GFR, Estimated: 45 mL/min — ABNORMAL LOW (ref >60)
Glucose, Bld: 110 mg/dL — ABNORMAL HIGH (ref 70–99)
Potassium: 4 mmol/L (ref 3.5–5.1)
Sodium: 132 mmol/L — ABNORMAL LOW (ref 135–145)

## 2024-01-20 LAB — GLUCOSE, CAPILLARY
Glucose-Capillary: 101 mg/dL — ABNORMAL HIGH (ref 70–99)
Glucose-Capillary: 129 mg/dL — ABNORMAL HIGH (ref 70–99)
Glucose-Capillary: 128 mg/dL — ABNORMAL HIGH (ref 70–99)
Glucose-Capillary: 144 mg/dL — ABNORMAL HIGH (ref 70–99)

## 2024-01-20 MED ORDER — HYDROMORPHONE HCL 1 MG/ML IJ SOLN: 0.5000 mg | INTRAMUSCULAR | Status: DC | PRN

## 2024-01-20 NOTE — Progress Notes (Signed)
 PT Cancellation Note  Patient Details Name: Wanda Monroe MRN: [id number] DOB: Apr 08, 1941   Cancelled Treatment:     PT attempt. Pt currently working with OT. Will return tomorrow and continue to follow per current POC.    Koleen Perna 01/20/2024, 1:59 PM

## 2024-01-20 NOTE — Plan of Care (Signed)

## 2024-01-20 NOTE — Progress Notes (Signed)
 Occupational Therapy Treatment Patient Details Name: Wanda Monroe MRN: [id number] DOB: 1940-11-27 Today's Date: 01/20/2024   History of present illness Wanda Monroe is a 83 y.o. female with medical history significant for dementia, history hemorrhagic cva, htn, ra, dm, tr, hiatal hernia, on hospice.   Tripped and fell a week ago, seen in our ER, diagnosed with thoracic compression fracture ( T12). Presents with another trip and fall today, complaining of left facial pain and numbness. No cough. No lower extremity weakness/numbness. No fevers. No chest pain.   OT comments  Pt seen for OT treatment on this date. Upon arrival to room pt lying in bed with family at beside, agreeable to tx. Pt requires MAXA to don TLSO in sitting prior to OOB mobility. Pt completed sink level grooming tasks with CGA, noted improved standing tolerance/static standing balance on this date. Pt took seated rest break before amb around bed to sit up in the recliner, CGA+RW, min verbal cues for DME management. Pt retired in [employer] with all needs in reach. Pt making good progress toward goals, will continue to follow POC. Discharge recommendation remains appropriate.        If plan is discharge home, recommend the following:  A little help with walking and/or transfers;A lot of help with bathing/dressing/bathroom;Assistance with cooking/housework;Assist for transportation;Help with stairs or ramp for entrance   Equipment Recommendations  Other (comment)    Recommendations for Other Services      Precautions / Restrictions Precautions Precautions: Fall Recall of Precautions/Restrictions: Impaired Precaution/Restrictions Comments: assuming back precautions--no BLT Required Braces or Orthoses: Spinal Brace Spinal Brace: Thoracolumbosacral orthotic Restrictions Weight Bearing Restrictions Per Provider Order: No       Mobility Bed Mobility Overal bed mobility: Needs Assistance Bed Mobility: Sit to Sidelying,  Rolling Rolling: Supervision       Sit to sidelying: Supervision General bed mobility comments: Supervision log roll technique, good carryover from previous session    Transfers Overall transfer level: Needs assistance Equipment used: Rolling walker (2 wheels) Transfers: Sit to/from Stand, Bed to chair/wheelchair/BSC Sit to Stand: Contact guard assist           General transfer comment: Amb around bed into the recliner with RW+CGA     Balance Overall balance assessment: Needs assistance Sitting-balance support: Feet supported Sitting balance-Leahy Scale: Fair     Standing balance support: No upper extremity supported, During functional activity (Static standing tolerance improved) Standing balance-Leahy Scale: Poor                             ADL either performed or assessed with clinical judgement   ADL Overall ADL's : Needs assistance/impaired     Grooming: Wash/dry face;Wash/dry hands;Oral care;Standing;Contact guard assist Grooming Details (indicate cue type and reason): Sink level         Upper Body Dressing : Maximal assistance;Sitting Upper Body Dressing Details (indicate cue type and reason): Donning/doffing TLSO in sitting     Toilet Transfer: Ambulation;Rolling walker (2 wheels);Contact guard assist (Simulated)           Functional mobility during ADLs: Contact guard assist;Rolling walker (2 wheels) General ADL Comments: MAXA don/doff TLSO in sitting, CGA sink level grooming tasks, toilet t/f CGA with RW    Communication Communication Communication: No apparent difficulties   Cognition Arousal: Alert Behavior During Therapy: WFL for tasks assessed/performed Cognition: No apparent impairments             OT -  Cognition Comments: A/Ox4, very plesant and eager to work with therapy                 Following commands: Intact        Cueing   Cueing Techniques: Verbal cues  Exercises Exercises: Other exercises Other  Exercises Other Exercises: Edu: Review of don/doffing TLSO, safe ADL completion with RW    Shoulder Instructions       General Comments Motivated to get better    Pertinent Vitals/ Pain       Pain Assessment Pain Assessment: 0-10 Pain Score: 4  Pain Location: Lower  back Pain Descriptors / Indicators: Discomfort, Grimacing, Moaning Pain Intervention(s): Limited activity within patient's tolerance, Monitored during session, Repositioned  Home Living                                          Prior Functioning/Environment              Frequency  Min 2X/week        Progress Toward Goals  OT Goals(current goals can now be found in the care plan section)  Progress towards OT goals: Progressing toward goals  Acute Rehab OT Goals Patient Stated Goal: improve pain and function OT Goal Formulation: With patient Time For Goal Achievement: 02/02/24 Potential to Achieve Goals: Fair ADL Goals Pt Will Perform Upper Body Bathing: with supervision;with contact guard assist;sitting Pt Will Perform Lower Body Dressing: with min assist;with adaptive equipment;sit to/from stand;sitting/lateral leans Pt Will Transfer to Toilet: with contact guard assist;regular height toilet;ambulating;grab bars  Plan      Co-evaluation                 AM-PAC OT "6 Clicks" Daily Activity     Outcome Measure   Help from another person eating meals?: None Help from another person taking care of personal grooming?: A Little Help from another person toileting, which includes using toliet, bedpan, or urinal?: A Little Help from another person bathing (including washing, rinsing, drying)?: A Lot Help from another person to put on and taking off regular upper body clothing?: A Little Help from another person to put on and taking off regular lower body clothing?: A Lot 6 Click Score: 17    End of Session Equipment Utilized During Treatment: Rolling walker (2 wheels);Back  brace;Gait belt  OT Visit Diagnosis: Other abnormalities of gait and mobility (R26.89);Unsteadiness on feet (R26.81);Repeated falls (R29.6);Muscle weakness (generalized) (M62.81)   Activity Tolerance Patient tolerated treatment well   Patient Left in chair;with call bell/phone within reach;with chair alarm set   Nurse Communication Mobility status        Time: 0454-0981 OT Time Calculation (min): 30 min  Charges: OT General Charges $OT Visit: 1 Visit OT Treatments $Self Care/Home Management : 8-22 mins $Therapeutic Activity: 8-22 mins  Rosaria Common M.S. OTR/L  01/20/24, 3:03 PM

## 2024-01-20 NOTE — Progress Notes (Signed)
 PROGRESS NOTE    OSA SCHRAG  UEA:[id number] DOB: 04/11/1941 DOA: 01/18/2024 PCP: Pcp, No      Brief Narrative:   Wanda Monroe is a 83 y.o. female with medical history significant for dementia, history hemorrhagic cva, htn, ra, dm, tr, hiatal hernia, on hospice, who presents with the above.   Tripped and fell a week ago, seen in our ER, diagnosed with thoracic compression fracture. Presents with another trip and fall today, complaining of left facial pain and numbness. No cough. No lower extremity weakness/numbness. No fevers. No chest pain.    Assessment & Plan:   Principal Problem:   Thoracic compression fracture (HCC) Active Problems:   Type II diabetes mellitus with complication (HCC)   Hypertension   Hemorrhagic cerebrovascular accident (CVA) (HCC)   Tricuspid regurgitation   Temporal arteritis (HCC)   Rheumatoid arthritis (HCC)   Hospice care patient  # Left orbital and maxillary sinus fractures Dr Donnie Galea advises f/u 1 week, abs - cont augmentin  - ENT f/u 1 week   # T12 compression fracture Subacute. MRI shows to be stable fracture. On oral oxy, today reports inadequate control with that - TLSO brace when OOB - PT advising snf, patient is agreeable, toc aware - outpt neurosurg f/u - add IV dialudid   # AKI Vs progression of ckd. Baseline cr 1.05, was 1.33 on arrival, improved to 1.21 with 1 liter bolus yesterday - monitor off fluids  # Loose stool 2/2 stool softeners, resolved with holding them - monitor  # Manubrial fracture Normal troponins. No signs pnemo or other complication on CT. Mgmt is conservative   # Hospice patient - hospice following here, will revoke hospice temporarily for skilled nursing   # T2DM - SSI   # RA Doesn't appear to be treated   # Hx CVA - cont asa - cont statin   # Hiatal hernia Asymptomatic   # HTN Bp improved with resumption of home meds - cont home hydrochlorothiazide , spiro   # MDD - home duloxetine ,  mirtazapine , sertraline    DVT prophylaxis: lovenox  Code Status: dnr/dni Family Communication: hcpoa rodney updated telephonically 5/10  Level of care: Med-Surg Status is: Observation    Consultants:  neurosurgery  Procedures: none  Antimicrobials:  none    Subjective: Tolerating diet. Complaining of ongoing back pain no significant relief from oxy this morning  Objective: Vitals:   01/19/24 1602 01/19/24 2126 01/20/24 0406 01/20/24 0744  BP: (!) 125/48 (!) 133/54 (!) 150/58 (!) 124/98  Pulse: 73 79 68 71  Resp: 15 17 16 15   Temp: 98.8 F (37.1 C) 98.6 F (37 C) 98.2 F (36.8 C) 98 F (36.7 C)  TempSrc: Oral  Oral Oral  SpO2: 97% 100% 99% 100%  Weight:      Height:        Intake/Output Summary (Last 24 hours) at 01/20/2024 1223 Last data filed at 01/20/2024 1052 Gross per 24 hour  Intake 120 ml  Output --  Net 120 ml   Filed Weights   01/18/24 1208 01/18/24 2106  Weight: 58.1 kg 55.2 kg    Examination:  Constitutional: No acute distress Head: no apparent facial trauma Eyes: Conjunctiva clear ENM: poor dentition  Neck: Supple Respiratory: Clear to auscultation bilaterally, save for rales at bases Cardiovascular: Regular rate and rhythm. Systolic murmur Abdomen: Non-tender, soft.   Musculoskeletal: No joint deformity upper and lower extremities.   Skin: No rashes, lesions, or ulcers.  Extremities: No peripheral edema. warm Neurologic: Alert,  moving all 4 extremities. Psychiatric: mild confusion    Data Reviewed: I have personally reviewed following labs and imaging studies  CBC: Recent Labs  Lab 01/18/24 1215 01/19/24 0324  WBC 8.6 8.1  NEUTROABS 7.2  --   HGB 9.3* 9.2*  HCT 30.1* 30.5*  MCV 90.1 91.3  PLT 457* 441*   Basic Metabolic Panel: Recent Labs  Lab 01/18/24 1215 01/19/24 0324 01/20/24 0517  NA 133* 134* 132*  K 4.4 4.3 4.0  CL 96* 97* 98  CO2 25 25 27   GLUCOSE 110* 116* 110*  BUN 21 22 27*  CREATININE 1.33* 1.31*  1.21*  CALCIUM  9.5 9.3 8.8*   GFR: Estimated Creatinine Clearance: 31.8 mL/min (A) (by C-G formula based on SCr of 1.21 mg/dL (H)). Liver Function Tests: Recent Labs  Lab 01/18/24 1215 01/19/24 0324  AST 20 18  ALT 10 9  ALKPHOS 91 89  BILITOT 0.5 0.5  PROT 7.1 6.7  ALBUMIN 3.6 3.3*   No results for input(s): "LIPASE", "AMYLASE" in the last 168 hours. No results for input(s): "AMMONIA" in the last 168 hours. Coagulation Profile: No results for input(s): "INR", "PROTIME" in the last 168 hours. Cardiac Enzymes: Recent Labs  Lab 01/18/24 1215  CKTOTAL 56   BNP (last 3 results) No results for input(s): "PROBNP" in the last 8760 hours. HbA1C: Recent Labs    01/18/24 1215  HGBA1C 6.3*   CBG: Recent Labs  Lab 01/19/24 1201 01/19/24 1701 01/19/24 2126 01/20/24 0744 01/20/24 1151  GLUCAP 120* 123* 112* 101* 129*   Lipid Profile: No results for input(s): "CHOL", "HDL", "LDLCALC", "TRIG", "CHOLHDL", "LDLDIRECT" in the last 72 hours. Thyroid  Function Tests: No results for input(s): "TSH", "T4TOTAL", "FREET4", "T3FREE", "THYROIDAB" in the last 72 hours. Anemia Panel: No results for input(s): "VITAMINB12", "FOLATE", "FERRITIN", "TIBC", "IRON ", "RETICCTPCT" in the last 72 hours. Urine analysis:    Component Value Date/Time   COLORURINE YELLOW (A) 05/20/2023 2137   APPEARANCEUR CLEAR (A) 05/20/2023 2137   LABSPEC 1.023 05/20/2023 2137   PHURINE 5.0 05/20/2023 2137   GLUCOSEU NEGATIVE 05/20/2023 2137   HGBUR NEGATIVE 05/20/2023 2137   BILIRUBINUR NEGATIVE 05/20/2023 2137   KETONESUR NEGATIVE 05/20/2023 2137   PROTEINUR NEGATIVE 05/20/2023 2137   NITRITE NEGATIVE 05/20/2023 2137   LEUKOCYTESUR NEGATIVE 05/20/2023 2137   Sepsis Labs: @LABRCNTIP (procalcitonin:4,lacticidven:4)  )No results found for this or any previous visit (from the past 240 hours).       Radiology Studies: MR THORACIC SPINE WO CONTRAST Result Date: 01/18/2024 CLINICAL DATA:  t12 compression  fracture. EXAM: MRI THORACIC SPINE WITHOUT CONTRAST TECHNIQUE: Multiplanar, multisequence MR imaging of the thoracic spine was performed. No intravenous contrast was administered. COMPARISON:  Same day CT of the thoracic spine. FINDINGS: Alignment:  No substantial sagittal subluxation. Vertebrae: Acute/recent or unhealed T12 compression fracture with associated marrow edema and T2 hyperintense cleft along the superior endplate. Associated 25% height loss. Mild (3 mm) bony retropulsion with mild resulting canal stenosis. Cord:  Normal cord signal. Paraspinal and other soft tissues: Partially imaged large hiatal hernia. Disc levels: Mild canal stenosis at T12 as detailed above. Otherwise, no significant canal or foraminal stenosis. IMPRESSION: Acute/recent or unhealed T12 compression fracture with associated marrow edema and T2 hyperintense cleft along the superior endplate. Associated 25% height loss and 3 mm bony retropulsion with mild resulting canal stenosis. Electronically Signed   By: Stevenson Elbe M.D.   On: 01/18/2024 23:27   DG Pelvis 1-2 Views Result Date: 01/18/2024 CLINICAL DATA:  Recent  fall with pelvic pain, initial encounter EXAM: PELVIS - 1 VIEW COMPARISON:  CT from earlier in the same day. FINDINGS: Pelvic ring is intact. Diffuse vascular calcifications are seen. No acute abnormality is noted. Prior right hip replacement is noted. IMPRESSION: No acute abnormality noted. Electronically Signed   By: Violeta Grey M.D.   On: 01/18/2024 19:15   CT L-SPINE NO CHARGE Result Date: 01/18/2024 CLINICAL DATA:  Fall and back pain.  Left facial numbness. EXAM: CT THORACIC AND LUMBAR SPINE WITHOUT CONTRAST TECHNIQUE: Multidetector CT imaging of the thoracic and lumbar spine was performed without contrast. Multiplanar CT image reconstructions were also generated. RADIATION DOSE REDUCTION: This exam was performed according to the departmental dose-optimization program which includes automated exposure  control, adjustment of the mA and/or kV according to patient size and/or use of iterative reconstruction technique. COMPARISON:  Chest radiograph dated 01/18/2024 and CT abdomen pelvis dated 01/11/2024. FINDINGS: CT THORACIC SPINE FINDINGS Alignment: No acute subluxation. Vertebrae: Progression of T12 superior endplate compression fracture compared to prior CT with extension of the fracture to the anterior vertebral body. There is approximately 10% loss of vertebral body height. There is a 4 mm retropulsion of the posterosuperior endplate. No other acute thoracic spine fracture. The bones are osteopenic. Paraspinal and other soft tissues: Mild standing adjacent to T12. No paraspinal fluid collection or hematoma. Disc levels: Multilevel degenerative changes. CT LUMBAR SPINE FINDINGS Segmentation: 5 lumbar type vertebrae. Alignment: No acute subluxation. Vertebrae: No acute fracture.  Osteopenia. Paraspinal and other soft tissues: No acute findings. Disc levels: Multilevel degenerative changes and disc desiccation and vacuum phenomena. IMPRESSION: 1. Progression of T12 superior endplate compression fracture since the prior CT with extension of the fracture to the anterior vertebral body. There is a 4 mm retropulsion of the posterior superior endplate. 2. No acute/traumatic lumbar spine pathology. Electronically Signed   By: Angus Bark M.D.   On: 01/18/2024 17:16   CT T-SPINE NO CHARGE Result Date: 01/18/2024 CLINICAL DATA:  Fall and back pain.  Left facial numbness. EXAM: CT THORACIC AND LUMBAR SPINE WITHOUT CONTRAST TECHNIQUE: Multidetector CT imaging of the thoracic and lumbar spine was performed without contrast. Multiplanar CT image reconstructions were also generated. RADIATION DOSE REDUCTION: This exam was performed according to the departmental dose-optimization program which includes automated exposure control, adjustment of the mA and/or kV according to patient size and/or use of iterative  reconstruction technique. COMPARISON:  Chest radiograph dated 01/18/2024 and CT abdomen pelvis dated 01/11/2024. FINDINGS: CT THORACIC SPINE FINDINGS Alignment: No acute subluxation. Vertebrae: Progression of T12 superior endplate compression fracture compared to prior CT with extension of the fracture to the anterior vertebral body. There is approximately 10% loss of vertebral body height. There is a 4 mm retropulsion of the posterosuperior endplate. No other acute thoracic spine fracture. The bones are osteopenic. Paraspinal and other soft tissues: Mild standing adjacent to T12. No paraspinal fluid collection or hematoma. Disc levels: Multilevel degenerative changes. CT LUMBAR SPINE FINDINGS Segmentation: 5 lumbar type vertebrae. Alignment: No acute subluxation. Vertebrae: No acute fracture.  Osteopenia. Paraspinal and other soft tissues: No acute findings. Disc levels: Multilevel degenerative changes and disc desiccation and vacuum phenomena. IMPRESSION: 1. Progression of T12 superior endplate compression fracture since the prior CT with extension of the fracture to the anterior vertebral body. There is a 4 mm retropulsion of the posterior superior endplate. 2. No acute/traumatic lumbar spine pathology. Electronically Signed   By: Angus Bark M.D.   On: 01/18/2024 17:16  CT CHEST ABDOMEN PELVIS W CONTRAST Result Date: 01/18/2024 CLINICAL DATA:  Fall today. EXAM: CT CHEST, ABDOMEN, AND PELVIS WITH CONTRAST TECHNIQUE: Multidetector CT imaging of the chest, abdomen and pelvis was performed following the standard protocol during bolus administration of intravenous contrast. RADIATION DOSE REDUCTION: This exam was performed according to the departmental dose-optimization program which includes automated exposure control, adjustment of the mA and/or kV according to patient size and/or use of iterative reconstruction technique. CONTRAST:  75mL OMNIPAQUE  IOHEXOL  300 MG/ML  SOLN COMPARISON:  Jan 11, 2024. FINDINGS:  CT CHEST FINDINGS Cardiovascular: Atherosclerosis of thoracic aorta without aneurysm or dissection. Normal cardiac size. Coronary artery calcifications are noted. No pericardial effusion. Mediastinum/Nodes: Large sliding-type hiatal hernia. No adenopathy. Thyroid  gland is unremarkable. Lungs/Pleura: No pneumothorax or pleural effusion is noted. Probable mild atelectasis or scarring is noted in right middle lobe and lingular segment of left upper lobe. Musculoskeletal: Probable mildly displaced manubrial fracture is noted. Probable mild depression of superior endplate of T12 vertebral body concerning for acute to subacute fracture as noted on prior CT scan. CT ABDOMEN PELVIS FINDINGS Hepatobiliary: No focal liver abnormality is seen. No gallstones, gallbladder wall thickening, or biliary dilatation. Pancreas: Unremarkable. No pancreatic ductal dilatation or surrounding inflammatory changes. Spleen: Normal in size without focal abnormality. Adrenals/Urinary Tract: Adrenal glands are unremarkable. Kidneys are normal, without renal calculi, focal lesion, or hydronephrosis. Bladder is unremarkable. Stomach/Bowel: Stomach is unremarkable. There is no evidence of bowel obstruction or inflammation. Status post appendectomy. Vascular/Lymphatic: Aortic atherosclerosis. No enlarged abdominal or pelvic lymph nodes. Reproductive: Status post hysterectomy. No adnexal masses. Other: No ascites or hernia is noted. Musculoskeletal: Status post right total hip arthroplasty. Multilevel degenerative changes are noted in lumbar spine. IMPRESSION: Mildly displaced manubrial fracture is noted. Mild superior endplate depression of T12 vertebral body is noted which was present on prior exam of Jan 11, 2024, and is concerning for acute to subacute compression fracture. Large sliding-type hiatal hernia. Coronary artery calcifications are noted suggesting coronary artery disease. Aortic Atherosclerosis (ICD10-I70.0). Electronically Signed   By:  Rosalene Colon M.D.   On: 01/18/2024 16:56   CT Maxillofacial Wo Contrast Result Date: 01/18/2024 CLINICAL DATA:  Trauma.  Fall. EXAM: CT MAXILLOFACIAL WITHOUT CONTRAST TECHNIQUE: Multidetector CT imaging of the maxillofacial structures was performed. Multiplanar CT image reconstructions were also generated. RADIATION DOSE REDUCTION: This exam was performed according to the departmental dose-optimization program which includes automated exposure control, adjustment of the mA and/or kV according to patient size and/or use of iterative reconstruction technique. COMPARISON:  None Available. FINDINGS: Osseous: There is a left inferior orbital wall fracture, minimally depressed/displaced. There is some fat herniation into the sinus. There is also acute nondisplaced fracture of the lateral wall the left maxillary sinus in minimally depressed acute fracture of the anterior wall of the left maxillary sinus. There is also subtle nondisplaced fracture of the lateral left orbital wall. There is no dislocation. Orbits: There is mild orbital edema inferiorly. No evidence for muscle entrapment or focal hematoma. The globes appear intact bilaterally. No foreign body Sinuses: There is a hyperdense air-fluid level in the left maxillary sinus. Soft tissues: No focal hematoma or foreign body. Limited intracranial: No significant or unexpected finding. IMPRESSION: 1. Acute fractures of the left inferior orbital wall, anterior wall of the left maxillary sinus, and lateral wall of the left orbit. 2. Hyperdense air-fluid level in the left maxillary sinus compatible with hemorrhage. Electronically Signed   By: Tyron Gallon M.D.   On: 01/18/2024  16:51   DG Chest 2 View Result Date: 01/18/2024 CLINICAL DATA:  Provided history: Fall. Chest pain. Additional history provided: Left lower rib pain after fall this morning. EXAM: CHEST - 2 VIEW COMPARISON:  Prior chest radiographs 01/11/2024 and earlier. Lumbar spine CT 01/11/2024. FINDINGS:  Heart size within normal limits. Aortic atherosclerosis. Large hiatal hernia. No appreciable airspace consolidation. No evidence of pleural effusion or pneumothorax. Anterior wedge vertebral compression fracture within the lower thoracic or upper lumbar spine (with up to 40% vertebral body height loss), new or progressed since the lumbar spine CT of 01/11/2024. No other acute osseous abnormality is identified. IMPRESSION: 1. No evidence of an acute cardiopulmonary abnormality. 2. Vertebral compression fracture within the lower thoracic or upper lumbar spine (with up to 40% vertebral body height loss), new or progressed since the lumbar spine CT of 01/11/2024. Dedicated spinal imaging recommended. 3. Please note, this examination is not specifically tailored for the assessment of the ribs. Rib radiographs may be obtained for further evaluation, as clinically warranted. 4. Large hiatal hernia. 5. Aortic Atherosclerosis (ICD10-I70.0). Electronically Signed   By: Bascom Lily D.O.   On: 01/18/2024 13:21   CT HEAD WO CONTRAST ( ) Result Date: 01/18/2024 CLINICAL DATA:  Head and neck trauma EXAM: CT HEAD WITHOUT CONTRAST CT CERVICAL SPINE WITHOUT CONTRAST TECHNIQUE: Multidetector CT imaging of the head and cervical spine was performed following the standard protocol without intravenous contrast. Multiplanar CT image reconstructions of the cervical spine were also generated. RADIATION DOSE REDUCTION: This exam was performed according to the departmental dose-optimization program which includes automated exposure control, adjustment of the mA and/or kV according to patient size and/or use of iterative reconstruction technique. COMPARISON:  01/11/2024 FINDINGS: CT HEAD FINDINGS Brain: No evidence of acute infarction, hemorrhage, hydrocephalus, extra-axial collection or mass lesion/mass effect. Periventricular and deep white matter hypodensity. Unchanged right occipital and right cerebellar hemisphere encephalomalacia.  Vascular: No hyperdense vessel or unexpected calcification. Skull: Normal. Negative for fracture or focal lesion. Sinuses/Orbits: Intermediate attenuation left maxillary sinus air-fluid level. Overlying irregularity of the maxillary sinus walls, of uncertain acuity (series 5, image 5) Other: None. CT CERVICAL SPINE FINDINGS Alignment: Normal. Skull base and vertebrae: No acute fracture. No primary bone lesion or focal pathologic process. Soft tissues and spinal canal: No prevertebral fluid or swelling. No visible canal hematoma. Disc levels:  Intact. Upper chest: Negative. Other: None. IMPRESSION: 1. No acute intracranial pathology. 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 2. Intermediate attenuation left maxillary sinus air-fluid level. Overlying irregularity of the maxillary sinus walls, of uncertain acuity. Correlate for point tenderness and consider dedicated maxillofacial CT to further evaluate. 3. No fracture or static subluxation of the cervical spine. Electronically Signed   By: Fredricka Jenny M.D.   On: 01/18/2024 13:06   CT Cervical Spine Wo Contrast Result Date: 01/18/2024 CLINICAL DATA:  Head and neck trauma EXAM: CT HEAD WITHOUT CONTRAST CT CERVICAL SPINE WITHOUT CONTRAST TECHNIQUE: Multidetector CT imaging of the head and cervical spine was performed following the standard protocol without intravenous contrast. Multiplanar CT image reconstructions of the cervical spine were also generated. RADIATION DOSE REDUCTION: This exam was performed according to the departmental dose-optimization program which includes automated exposure control, adjustment of the mA and/or kV according to patient size and/or use of iterative reconstruction technique. COMPARISON:  01/11/2024 FINDINGS: CT HEAD FINDINGS Brain: No evidence of acute infarction, hemorrhage, hydrocephalus, extra-axial collection or mass lesion/mass effect. Periventricular and  deep white matter hypodensity.  Unchanged right occipital and right cerebellar hemisphere encephalomalacia. Vascular: No hyperdense vessel or unexpected calcification. Skull: Normal. Negative for fracture or focal lesion. Sinuses/Orbits: Intermediate attenuation left maxillary sinus air-fluid level. Overlying irregularity of the maxillary sinus walls, of uncertain acuity (series 5, image 5) Other: None. CT CERVICAL SPINE FINDINGS Alignment: Normal. Skull base and vertebrae: No acute fracture. No primary bone lesion or focal pathologic process. Soft tissues and spinal canal: No prevertebral fluid or swelling. No visible canal hematoma. Disc levels:  Intact. Upper chest: Negative. Other: None. IMPRESSION: 1. No acute intracranial pathology. 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 2. Intermediate attenuation left maxillary sinus air-fluid level. Overlying irregularity of the maxillary sinus walls, of uncertain acuity. Correlate for point tenderness and consider dedicated maxillofacial CT to further evaluate. 3. No fracture or static subluxation of the cervical spine. Electronically Signed   By: Fredricka Jenny M.D.   On: 01/18/2024 13:06        Scheduled Meds:  amoxicillin -clavulanate  1 tablet Oral BID   aspirin  EC  81 mg Oral Daily   atorvastatin   80 mg Oral Daily   DULoxetine   30 mg Oral Daily   enoxaparin  (LOVENOX ) injection  40 mg Subcutaneous Q24H   feeding supplement  237 mL Oral BID BM   hydrochlorothiazide   25 mg Oral Daily   insulin  aspart  0-5 Units Subcutaneous QHS   insulin  aspart  0-9 Units Subcutaneous TID WC   latanoprost   1 drop Both Eyes QHS   mirtazapine   15 mg Oral QHS   sertraline   50 mg Oral QHS   sodium chloride  flush  3 mL Intravenous Q12H   spironolactone   25 mg Oral Daily   timolol   1 drop Both Eyes BID   Continuous Infusions:     LOS: 0 days     Raymonde Calico, MD [facility]   If  7PM-7AM, please contact night-coverage [email or web address] Password [facility] 01/20/2024, 12:23 PM

## 2024-01-20 NOTE — Plan of Care (Signed)
   Problem: Education: Goal: Ability to describe self-care measures that may prevent or decrease complications (Diabetes Survival Skills Education) will improve Outcome: Progressing   Problem: Coping: Goal: Ability to adjust to condition or change in health will improve Outcome: Progressing   Problem: Fluid Volume: Goal: Ability to maintain a balanced intake and output will improve Outcome: Progressing

## 2024-01-20 NOTE — Progress Notes (Signed)
 Blood sugars at 144

## 2024-01-21 DIAGNOSIS — S22080A Wedge compression fracture of T11-T12 vertebra, initial encounter for closed fracture: Secondary | ICD-10-CM | POA: Diagnosis not present

## 2024-01-21 DIAGNOSIS — S22080D Wedge compression fracture of T11-T12 vertebra, subsequent encounter for fracture with routine healing: Secondary | ICD-10-CM | POA: Diagnosis not present

## 2024-01-21 LAB — BASIC METABOLIC PANEL WITH GFR
Anion gap: 11 (ref 5–15)
BUN: 37 mg/dL — ABNORMAL HIGH (ref 8–23)
CO2: 25 mmol/L (ref 22–32)
Calcium: 8.8 mg/dL — ABNORMAL LOW (ref 8.9–10.3)
Chloride: 95 mmol/L — ABNORMAL LOW (ref 98–111)
Creatinine, Ser: 1.18 mg/dL — ABNORMAL HIGH (ref 0.44–1.00)
GFR, Estimated: 46 mL/min — ABNORMAL LOW (ref >60)
Glucose, Bld: 136 mg/dL — ABNORMAL HIGH (ref 70–99)
Potassium: 3.4 mmol/L — ABNORMAL LOW (ref 3.5–5.1)
Sodium: 131 mmol/L — ABNORMAL LOW (ref 135–145)

## 2024-01-21 LAB — GLUCOSE, CAPILLARY
Glucose-Capillary: 142 mg/dL — ABNORMAL HIGH (ref 70–99)
Glucose-Capillary: 142 mg/dL — ABNORMAL HIGH (ref 70–99)
Glucose-Capillary: 122 mg/dL — ABNORMAL HIGH (ref 70–99)
Glucose-Capillary: 154 mg/dL — ABNORMAL HIGH (ref 70–99)

## 2024-01-21 MED ORDER — POLYETHYLENE GLYCOL 3350 17 G PO PACK
17.0000 g | PACK | Freq: Every day | ORAL | Status: DC
  Administered 2024-01-21 – 2024-01-23 (×3): 17 g via ORAL
  Filled 2024-01-21 (×3): qty 1

## 2024-01-21 NOTE — TOC Initial Note (Signed)
 Transition of Care [facility]) - Initial/Assessment Note    Patient Details  Name: Wanda Monroe MRN: [id number] Date of Birth: [birth year]  Transition of Care [facility]) CM/SW Contact:    Marino Sias, RN Phone Number: 01/21/2024, 11:35 AM  Clinical Narrative:  Patient alert and oriented x3, voices [place], nephrew lives above her, however works during the day. Currently receiving Hospice, [facility] and Aide services. Patient has cane and rolling walker at home. Agrees with SNF recommendation, no preference.                 Expected Discharge Plan: Skilled Nursing Facility Barriers to Discharge: Continued Medical Work up   Patient Goals and CMS Choice Patient states their goals for this hospitalization and ongoing recovery are:: Agrees to go to SNF, no preference CMS Medicare.gov Compare Post Acute Care list provided to:: Patient Choice offered to / list presented to : Patient      Expected Discharge Plan and Services In-house Referral: Clinical Social Work   Post Acute Care Choice: Skilled Nursing Facility Living arrangements for the past 2 months: [place]                 DME Arranged: N/A DME Agency: NA                  Prior Living Arrangements/Services Living arrangements for the past 2 months: [place] Lives with:: Self Patient language and need for interpreter reviewed:: Yes Do you feel safe going back to the place where you live?: Yes      Need for Family Participation in Patient Care: No (Comment) Care giver support system in place?: No (comment) (Lives alone, nephrew lives above her however not there during the day.) Current home services: Hospice, Homehealth aide Criminal Activity/Legal Involvement Pertinent to Current Situation/Hospitalization: No - Comment as needed  Activities of Daily Living   ADL Screening (condition at time of admission) Independently performs ADLs?: No Does the patient have a NEW difficulty with  bathing/dressing/toileting/self-feeding that is expected to last >3 days?: Yes (Initiates electronic notice to provider for possible OT consult) Does the patient have a NEW difficulty with getting in/out of bed, walking, or climbing stairs that is expected to last >3 days?: Yes (Initiates electronic notice to provider for possible PT consult) Does the patient have a NEW difficulty with communication that is expected to last >3 days?: Yes (Initiates electronic notice to provider for possible SLP consult) Is the patient deaf or have difficulty hearing?: No Does the patient have difficulty seeing, even when wearing glasses/contacts?: No Does the patient have difficulty concentrating, remembering, or making decisions?: No  Permission Sought/Granted Permission sought to share information with : Case Manager, Other (comment) (Friend Charlann Confer and Texie File) Permission granted to share information with : Yes, Verbal Permission Granted              Emotional Assessment Appearance:: Other (Comment Required (spoke with patient via phone) Attitude/Demeanor/Rapport: Self-Confident, Engaged, Gracious Affect (typically observed): Accepting Orientation: : Oriented to Self, Oriented to Place, Oriented to Situation Alcohol / Substance Use: Not Applicable Psych Involvement: No (comment)  Admission diagnosis:  Thoracic compression fracture (HCC) [S22.000A] Closed fracture of manubrium, initial encounter [S22.21XA] Fall, initial encounter [W19.XXXA] Closed fracture of facial bone, unspecified facial bone, initial encounter (HCC) [S02.92XA] Compression fracture of thoracic vertebra, unspecified thoracic vertebral level, initial encounter [facility]) [S22.000A] Patient Active Problem List   Diagnosis Date Noted   Thoracic compression fracture (HCC) 01/18/2024  Acute postoperative anemia due to expected blood loss 05/22/2023   GERD without esophagitis 05/21/2023   Hypertensive urgency 05/21/2023   Hospice  care patient 05/21/2023   Closed right hip fracture (HCC) 05/20/2023   Dyslipidemia 05/20/2023   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) 05/20/2023   Rheumatoid arthritis (HCC) 05/20/2023   Aspiration pneumonia (HCC) 04/11/2022   Protein calorie malnutrition (HCC) 04/11/2022   Depression 04/11/2022   Cellulitis of foot 06/16/2021   Colon polyp 06/16/2021   Hiatal hernia with GERD 06/16/2021   Hemorrhagic cerebrovascular accident (CVA) (HCC) 06/16/2021   Osteoarthritis 06/16/2021   Osteoporosis, 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 [place] [address] Phone: [phone] Fax: [phone]     Social Drivers of Health (SDOH) Social History: SDOH Screenings   Food Insecurity: Food Insecurity Present (01/19/2024)  Housing: Low Risk  (01/19/2024)  Transportation Needs: No Transportation Needs (01/19/2024)  Utilities: Not At Risk (01/19/2024)  Social Connections: Socially Isolated (01/19/2024)  Tobacco Use: Low Risk  (01/18/2024)   SDOH Interventions:     Readmission Risk Interventions     No data to display

## 2024-01-21 NOTE — Progress Notes (Signed)
 PROGRESS NOTE    Wanda Monroe  ZOX:[id number] DOB: 1941/01/26 DOA: 01/18/2024 PCP: Pcp, No      Brief Narrative:   Wanda Monroe is a 83 y.o. female with medical history significant for dementia, history hemorrhagic cva, htn, ra, dm, tr, hiatal hernia, on hospice, who presents with the above.   Tripped and fell a week ago, seen in our ER, diagnosed with thoracic compression fracture. Presents with another trip and fall today, complaining of left facial pain and numbness. No cough. No lower extremity weakness/numbness. No fevers. No chest pain.    Assessment & Plan:   Principal Problem:   Thoracic compression fracture (HCC) Active Problems:   Type II diabetes mellitus with complication (HCC)   Hypertension   Hemorrhagic cerebrovascular accident (CVA) (HCC)   Tricuspid regurgitation   Temporal arteritis (HCC)   Rheumatoid arthritis (HCC)   Hospice care patient  # Left orbital and maxillary sinus fractures Dr Donnie Galea advises f/u 1 week, abs - cont augmentin  - ENT f/u 1 week   # T12 compression fracture Subacute. No sig pain. MRI shows to be stable fracture - TLSO brace when OOB - PT advising snf, patient is agreeable, TOC says should qualify for SNF soon - hcpoa and hospice concerned about safety at home - outpt neurosurg f/u   # ckd 3a Stable - monitor intermittently   # Manubrial fracture Normal troponins. No signs pnemo or other complication on CT. Mgmt is conservative   # Hospice patient - hospice following    # T2DM - SSI   # RA Doesn't appear to be treated   # Hx CVA - cont statin, asa   # Hiatal hernia Asymptomatic   # HTN Bp normalized with resumption of home meds - cont home hydrochlorothiazide , spiro   # MDD - home duloxetine , mirtazapine , sertraline    DVT prophylaxis: lovenox  Code Status: dnr/dni Family Communication: hcpoa rodney updated telephonically 5/10  Level of care: Med-Surg Status is: Observation    Consultants:   neurosurgery  Procedures: none  Antimicrobials:  none    Subjective: Feeling well, tolerating diet, small hard bm earlier today  Objective: Vitals:   01/20/24 2050 01/21/24 0428 01/21/24 0758 01/21/24 0855  BP:  (!) 145/52 (!) 183/54 (!) 121/49  Pulse: 64 65 66 67  Resp:  18 18   Temp:  98 F (36.7 C) 98.2 F (36.8 C)   TempSrc:  Oral Oral   SpO2: 94% 97% 99%   Weight:      Height:        Intake/Output Summary (Last 24 hours) at 01/21/2024 1404 Last data filed at 01/21/2024 0300 Gross per 24 hour  Intake 218 ml  Output --  Net 218 ml   Filed Weights   01/18/24 1208 01/18/24 2106  Weight: 58.1 kg 55.2 kg    Examination:  Constitutional: No acute distress Head: no apparent facial trauma Eyes: Conjunctiva clear ENM: poor dentition  Neck: Supple Respiratory: Clear to auscultation bilaterally, save for rales at bases Cardiovascular: Regular rate and rhythm. Systolic murmur Abdomen: Non-tender, soft.   Musculoskeletal: No joint deformity upper and lower extremities.   Skin: No rashes, lesions, or ulcers.  Extremities: No peripheral edema. warm Neurologic: Alert, moving all 4 extremities. Psychiatric: mild confusion    Data Reviewed: I have personally reviewed following labs and imaging studies  CBC: Recent Labs  Lab 01/18/24 1215 01/19/24 0324  WBC 8.6 8.1  NEUTROABS 7.2  --   HGB 9.3* 9.2*  HCT  30.1* 30.5*  MCV 90.1 91.3  PLT 457* 441*   Basic Metabolic Panel: Recent Labs  Lab 01/18/24 1215 01/19/24 0324 01/20/24 0517 01/21/24 0424  NA 133* 134* 132* 131*  K 4.4 4.3 4.0 3.4*  CL 96* 97* 98 95*  CO2 25 25 27 25   GLUCOSE 110* 116* 110* 136*  BUN 21 22 27* 37*  CREATININE 1.33* 1.31* 1.21* 1.18*  CALCIUM  9.5 9.3 8.8* 8.8*   GFR: Estimated Creatinine Clearance: 32.6 mL/min (A) (by C-G formula based on SCr of 1.18 mg/dL (H)). Liver Function Tests: Recent Labs  Lab 01/18/24 1215 01/19/24 0324  AST 20 18  ALT 10 9  ALKPHOS 91 89   BILITOT 0.5 0.5  PROT 7.1 6.7  ALBUMIN 3.6 3.3*   No results for input(s): "LIPASE", "AMYLASE" in the last 168 hours. No results for input(s): "AMMONIA" in the last 168 hours. Coagulation Profile: No results for input(s): "INR", "PROTIME" in the last 168 hours. Cardiac Enzymes: Recent Labs  Lab 01/18/24 1215  CKTOTAL 56   BNP (last 3 results) No results for input(s): "PROBNP" in the last 8760 hours. HbA1C: No results for input(s): "HGBA1C" in the last 72 hours.  CBG: Recent Labs  Lab 01/20/24 1151 01/20/24 1619 01/20/24 2055 01/21/24 0756 01/21/24 1129  GLUCAP 129* 128* 144* 142* 142*   Lipid Profile: No results for input(s): "CHOL", "HDL", "LDLCALC", "TRIG", "CHOLHDL", "LDLDIRECT" in the last 72 hours. Thyroid  Function Tests: No results for input(s): "TSH", "T4TOTAL", "FREET4", "T3FREE", "THYROIDAB" in the last 72 hours. Anemia Panel: No results for input(s): "VITAMINB12", "FOLATE", "FERRITIN", "TIBC", "IRON ", "RETICCTPCT" in the last 72 hours. Urine analysis:    Component Value Date/Time   COLORURINE YELLOW (A) 05/20/2023 2137   APPEARANCEUR CLEAR (A) 05/20/2023 2137   LABSPEC 1.023 05/20/2023 2137   PHURINE 5.0 05/20/2023 2137   GLUCOSEU NEGATIVE 05/20/2023 2137   HGBUR NEGATIVE 05/20/2023 2137   BILIRUBINUR NEGATIVE 05/20/2023 2137   KETONESUR NEGATIVE 05/20/2023 2137   PROTEINUR NEGATIVE 05/20/2023 2137   NITRITE NEGATIVE 05/20/2023 2137   LEUKOCYTESUR NEGATIVE 05/20/2023 2137   Sepsis Labs: @LABRCNTIP (procalcitonin:4,lacticidven:4)  )No results found for this or any previous visit (from the past 240 hours).       Radiology Studies: No results found.       Scheduled Meds:  amoxicillin -clavulanate  1 tablet Oral BID   aspirin  EC  81 mg Oral Daily   atorvastatin   80 mg Oral Daily   DULoxetine   30 mg Oral Daily   enoxaparin  (LOVENOX ) injection  40 mg Subcutaneous Q24H   feeding supplement  237 mL Oral BID BM   hydrochlorothiazide   25 mg  Oral Daily   insulin  aspart  0-5 Units Subcutaneous QHS   insulin  aspart  0-9 Units Subcutaneous TID WC   latanoprost   1 drop Both Eyes QHS   mirtazapine   15 mg Oral QHS   sertraline   50 mg Oral QHS   sodium chloride  flush  3 mL Intravenous Q12H   spironolactone   25 mg Oral Daily   timolol   1 drop Both Eyes BID   Continuous Infusions:     LOS: 0 days     Raymonde Calico, MD [facility]   If 7PM-7AM, please contact night-coverage [email or web address] Password [facility] 01/21/2024, 2:04 PM

## 2024-01-21 NOTE — Plan of Care (Signed)

## 2024-01-22 DIAGNOSIS — S22080D Wedge compression fracture of T11-T12 vertebra, subsequent encounter for fracture with routine healing: Secondary | ICD-10-CM

## 2024-01-22 DIAGNOSIS — S22080A Wedge compression fracture of T11-T12 vertebra, initial encounter for closed fracture: Secondary | ICD-10-CM | POA: Diagnosis not present

## 2024-01-22 LAB — GLUCOSE, CAPILLARY
Glucose-Capillary: 123 mg/dL — ABNORMAL HIGH (ref 70–99)
Glucose-Capillary: 119 mg/dL — ABNORMAL HIGH (ref 70–99)
Glucose-Capillary: 121 mg/dL — ABNORMAL HIGH (ref 70–99)
Glucose-Capillary: 135 mg/dL — ABNORMAL HIGH (ref 70–99)

## 2024-01-22 NOTE — Progress Notes (Signed)
 Physical Therapy Treatment Patient Details Name: Wanda Monroe MRN: [id number] DOB: 09-Feb-1941 Today's Date: 01/22/2024   History of Present Illness Wanda Monroe is a 83 y.o. female with medical history significant for dementia, history hemorrhagic cva, htn, ra, dm, tr, hiatal hernia, on hospice.   Tripped and fell a week ago, seen in our ER, diagnosed with thoracic compression fracture ( T12). Presents with another trip and fall today, complaining of left facial pain and numbness. No cough. No lower extremity weakness/numbness. No fevers. No chest pain.    PT Comments  Pt is making slow progress towards goals, limited by pain/fatigue- limited session performed. Pt agreeable to sit at EOB, tolerated for a decent amount of time. Chose not to sit in recliner despite encouragement. Performed supine HEP and encouraged to continue performance when not in room. Will continue to progress.    If plan is discharge home, recommend the following: A lot of help with walking and/or transfers;A lot of help with bathing/dressing/bathroom;Assist for transportation;Help with stairs or ramp for entrance;Assistance with cooking/housework;Direct supervision/assist for medications management;Direct supervision/assist for financial management   Can travel by private vehicle     No  Equipment Recommendations  None recommended by PT    Recommendations for Other Services       Precautions / Restrictions Precautions Precautions: Fall Recall of Precautions/Restrictions: Impaired Precaution/Restrictions Comments: assuming back precautions--no BLT Required Braces or Orthoses: Spinal Brace Spinal Brace: Thoracolumbosacral orthotic Restrictions Weight Bearing Restrictions Per Provider Order: No     Mobility  Bed Mobility Overal bed mobility: Needs Assistance Bed Mobility: Supine to Sit, Sit to Supine     Supine to sit: Min assist Sit to supine: Contact guard assist   General bed mobility comments: follows  commands well with ability to sit at EOB for extended time. Pt then returned to bed, reports inability to further perform mobility efforts    Transfers                   General transfer comment: refused attempt due to fatigue/pain. Reports she has been getting OOB to [facility] with RN staff    Ambulation/Gait                   Stairs             Wheelchair Mobility     Tilt Bed    Modified Rankin (Stroke Patients Only)       Balance Overall balance assessment: Needs assistance Sitting-balance support: Feet supported Sitting balance-Leahy Scale: Fair                                      Hotel manager: No apparent difficulties  Cognition Arousal: Alert Behavior During Therapy: WFL for tasks assessed/performed   PT - Cognitive impairments: No apparent impairments                       PT - Cognition Comments: pleasant, only agreeable to limited session Following commands: Intact      Cueing Cueing Techniques: Verbal cues  Exercises Other Exercises Other Exercises: supine ther-ex performed on B LE including AP, SLRs, and hip abd/add. 10 reps and cued for continuing to perform while admitted    General Comments        Pertinent Vitals/Pain Pain Assessment Pain Assessment: Faces Faces Pain Scale: Hurts little more Pain Location: Lower  back Pain Descriptors / Indicators: Discomfort, Grimacing, Moaning Pain Intervention(s): Limited activity within patient's tolerance, Repositioned    Home Living                          Prior Function            PT Goals (current goals can now be found in the care plan section) Acute Rehab PT Goals Patient Stated Goal: reduce pain PT Goal Formulation: With patient Time For Goal Achievement: 02/02/24 Potential to Achieve Goals: Fair Progress towards PT goals: Progressing toward goals    Frequency    Min 2X/week      PT Plan       Co-evaluation              AM-PAC PT "6 Clicks" Mobility   Outcome Measure  Help needed turning from your back to your side while in a flat bed without using bedrails?: A Lot Help needed moving from lying on your back to sitting on the side of a flat bed without using bedrails?: A Lot Help needed moving to and from a bed to a chair (including a wheelchair)?: A Little Help needed standing up from a chair using your arms (e.g., wheelchair or bedside chair)?: A Little Help needed to walk in hospital room?: A Lot Help needed climbing 3-5 steps with a railing? : Total 6 Click Score: 13    End of Session Equipment Utilized During Treatment: Gait belt;Back brace Activity Tolerance: Patient limited by pain;Patient limited by fatigue Patient left: in bed;with bed alarm set Nurse Communication: Mobility status PT Visit Diagnosis: Other abnormalities of gait and mobility (R26.89);Muscle weakness (generalized) (M62.81);Difficulty in walking, not elsewhere classified (R26.2);Unsteadiness on feet (R26.81);Repeated falls (R29.6);Pain     Time: 1315-1335 PT Time Calculation (min) (ACUTE ONLY): 20 min  Charges:    $Therapeutic Exercise: 8-22 mins PT General Charges $$ ACUTE PT VISIT: 1 Visit                     Amparo Balk, PT, DPT, GCS [phone]    Pauletta Pickney 01/22/2024, 3:57 PM

## 2024-01-22 NOTE — Progress Notes (Signed)
 Occupational Therapy Treatment Patient Details Name: Wanda Monroe MRN: [id number] DOB: 1941/03/14 Today's Date: 01/22/2024   History of present illness Wanda Monroe is a 83 y.o. female with medical history significant for dementia, history hemorrhagic cva, htn, ra, dm, tr, hiatal hernia, on hospice.   Tripped and fell a week ago, seen in our ER, diagnosed with thoracic compression fracture ( T12). Presents with another trip and fall today, complaining of left facial pain and numbness. No cough. No lower extremity weakness/numbness. No fevers. No chest pain.   OT comments  [name] was seen for OT treatment on this date. Upon arrival to room pt in bed, agreeable to tx. Pt requires SUPERVISION for bed mobility. CGA standing grooming tasks. Pt making good progress toward goals, will continue to follow POC. Discharge recommendation remains appropriate.        If plan is discharge home, recommend the following:  A little help with walking and/or transfers;A lot of help with bathing/dressing/bathroom;Assistance with cooking/housework;Assist for transportation;Help with stairs or ramp for entrance   Equipment Recommendations  Other (comment) (defer)    Recommendations for Other Services      Precautions / Restrictions Precautions Precautions: Fall;Back Recall of Precautions/Restrictions: Impaired Required Braces or Orthoses: Spinal Brace Spinal Brace: Thoracolumbosacral orthotic Restrictions Weight Bearing Restrictions Per Provider Order: No       Mobility Bed Mobility Overal bed mobility: Needs Assistance Bed Mobility: Supine to Sit, Sit to Supine     Supine to sit: Supervision Sit to supine: Supervision        Transfers Overall transfer level: Needs assistance Equipment used: Rolling walker (2 wheels) Transfers: Sit to/from Stand Sit to Stand: Contact guard assist                 Balance Overall balance assessment: Needs assistance Sitting-balance support: Feet  supported Sitting balance-Leahy Scale: Fair     Standing balance support: No upper extremity supported, During functional activity Standing balance-Leahy Scale: Fair                             ADL either performed or assessed with clinical judgement   ADL Overall ADL's : Needs assistance/impaired                                       General ADL Comments: MAXA don/doff TLSO in sitting, CGA standing grooming tasks,     Communication Communication Communication: No apparent difficulties   Cognition Arousal: Alert Behavior During Therapy: WFL for tasks assessed/performed Cognition: No apparent impairments             OT - Cognition Comments: A/Ox4, very plesant and eager to work with therapy                 Following commands: Intact        Cueing   Cueing Techniques: Verbal cues  Exercises      Shoulder Instructions       General Comments      Pertinent Vitals/ Pain       Pain Assessment Pain Assessment: Faces Faces Pain Scale: Hurts little more Pain Location: Lower  back Pain Descriptors / Indicators: Discomfort, Grimacing, Moaning Pain Intervention(s): Limited activity within patient's tolerance, Repositioned   Frequency  Min 2X/week        Progress Toward Goals  OT Goals(current goals can  now be found in the care plan section)  Progress towards OT goals: Progressing toward goals  Acute Rehab OT Goals OT Goal Formulation: With patient Time For Goal Achievement: 02/02/24 Potential to Achieve Goals: Fair ADL Goals Pt Will Perform Upper Body Bathing: with supervision;with contact guard assist;sitting Pt Will Perform Lower Body Dressing: with min assist;with adaptive equipment;sit to/from stand;sitting/lateral leans Pt Will Transfer to Toilet: with contact guard assist;regular height toilet;ambulating;grab bars  Plan      Co-evaluation                 AM-PAC OT "6 Clicks" Daily Activity      Outcome Measure   Help from another person eating meals?: None Help from another person taking care of personal grooming?: A Little Help from another person toileting, which includes using toliet, bedpan, or urinal?: A Little Help from another person bathing (including washing, rinsing, drying)?: A Lot Help from another person to put on and taking off regular upper body clothing?: A Little Help from another person to put on and taking off regular lower body clothing?: A Lot 6 Click Score: 17    End of Session Equipment Utilized During Treatment: Rolling walker (2 wheels)  OT Visit Diagnosis: Other abnormalities of gait and mobility (R26.89);Unsteadiness on feet (R26.81);Repeated falls (R29.6);Muscle weakness (generalized) (M62.81)   Activity Tolerance Patient tolerated treatment well   Patient Left in bed;with call bell/phone within reach;with bed alarm set   Nurse Communication          Time: 1610-9604 OT Time Calculation (min): 14 min  Charges: OT General Charges $OT Visit: 1 Visit OT Treatments $Self Care/Home Management : 8-22 mins  Gordan Latina, M.S. OTR/L  01/22/24, 4:11 PM  ascom [phone]

## 2024-01-22 NOTE — Progress Notes (Signed)
 PROGRESS NOTE    Wanda Monroe  XBM:[id number] DOB: 05-08-1941 DOA: 01/18/2024 PCP: Pcp, No      Brief Narrative:   Wanda Monroe is a 83 y.o. female with medical history significant for dementia, history hemorrhagic cva, htn, ra, dm, tr, hiatal hernia, on hospice, who presents with the above.   Tripped and fell a week ago, seen in our ER, diagnosed with thoracic compression fracture. Presents with another trip and fall today, complaining of left facial pain and numbness. No cough. No lower extremity weakness/numbness. No fevers. No chest pain.    Assessment & Plan:   Principal Problem:   Thoracic compression fracture (HCC) Active Problems:   Type II diabetes mellitus with complication (HCC)   Hypertension   Hemorrhagic cerebrovascular accident (CVA) (HCC)   Tricuspid regurgitation   Temporal arteritis (HCC)   Rheumatoid arthritis (HCC)   Hospice care patient  # Left orbital and maxillary sinus fractures Dr Donnie Galea advises f/u 1 week, abs - cont augmentin  - ENT f/u 1 week   # T12 compression fracture Subacute. No sig pain. MRI shows to be stable fracture - TLSO brace when OOB - PT advising snf, patient is agreeable, but not currently a qualifying stay - hcpoa and hospice concerned about safety at home - outpt neurosurg f/u   # ckd 3a Stable - monitor intermittently   # Manubrial fracture Normal troponins. No signs pnemo or other complication on CT. Mgmt is conservative   # Hospice patient - hospice following    # T2DM - SSI   # RA Doesn't appear to be treated   # Hx CVA - cont statin, asa   # Hiatal hernia Asymptomatic   # HTN Bp normalized with resumption of home meds - cont home hydrochlorothiazide , spiro   # MDD - home duloxetine , mirtazapine , sertraline   # Disposition [facility] physician advisor Dr. Trenia Fritter is working with [facility] supervisor on next steps, appreciate that assistance.   DVT prophylaxis: lovenox  Code Status: dnr/dni Family  Communication: hcpoa rodney updated telephonically 5/10. Friend at bedside today  Level of care: Med-Surg Status is: Observation    Consultants:  neurosurgery  Procedures: none  Antimicrobials:  none    Subjective: Feeling well, tolerating diet, stooled twice yesterday  Objective: Vitals:   01/21/24 1627 01/21/24 2128 01/22/24 0527 01/22/24 0731  BP: 137/73 (!) 129/52 (!) 166/53 (!) 164/56  Pulse: 78 83 70 68  Resp: 15 18 18 16   Temp: 97.8 F (36.6 C) 98.5 F (36.9 C) 97.8 F (36.6 C) 98 F (36.7 C)  TempSrc: Oral Oral Oral   SpO2: 100% 98% 100% 97%  Weight:      Height:        Intake/Output Summary (Last 24 hours) at 01/22/2024 1247 Last data filed at 01/22/2024 0957 Gross per 24 hour  Intake 250 ml  Output --  Net 250 ml   Filed Weights   01/18/24 1208 01/18/24 2106  Weight: 58.1 kg 55.2 kg    Examination:  Constitutional: No acute distress Head: no apparent facial trauma Eyes: Conjunctiva clear ENM: poor dentition  Respiratory: Clear to auscultation bilaterally, save for rales at bases Cardiovascular: Regular rate and rhythm. Systolic murmur Abdomen: Non-tender, soft.   Musculoskeletal: No joint deformity upper and lower extremities.   Skin: No rashes, lesions, or ulcers.  Extremities: No peripheral edema. warm Neurologic: Alert, moving all 4 extremities. Psychiatric: calm    Data Reviewed: I have personally reviewed following labs and imaging studies  CBC: Recent  Labs  Lab 01/18/24 1215 01/19/24 0324  WBC 8.6 8.1  NEUTROABS 7.2  --   HGB 9.3* 9.2*  HCT 30.1* 30.5*  MCV 90.1 91.3  PLT 457* 441*   Basic Metabolic Panel: Recent Labs  Lab 01/18/24 1215 01/19/24 0324 01/20/24 0517 01/21/24 0424  NA 133* 134* 132* 131*  K 4.4 4.3 4.0 3.4*  CL 96* 97* 98 95*  CO2 25 25 27 25   GLUCOSE 110* 116* 110* 136*  BUN 21 22 27* 37*  CREATININE 1.33* 1.31* 1.21* 1.18*  CALCIUM  9.5 9.3 8.8* 8.8*   GFR: Estimated Creatinine Clearance:  32.6 mL/min (A) (by C-G formula based on SCr of 1.18 mg/dL (H)). Liver Function Tests: Recent Labs  Lab 01/18/24 1215 01/19/24 0324  AST 20 18  ALT 10 9  ALKPHOS 91 89  BILITOT 0.5 0.5  PROT 7.1 6.7  ALBUMIN 3.6 3.3*   No results for input(s): "LIPASE", "AMYLASE" in the last 168 hours. No results for input(s): "AMMONIA" in the last 168 hours. Coagulation Profile: No results for input(s): "INR", "PROTIME" in the last 168 hours. Cardiac Enzymes: Recent Labs  Lab 01/18/24 1215  CKTOTAL 56   BNP (last 3 results) No results for input(s): "PROBNP" in the last 8760 hours. HbA1C: No results for input(s): "HGBA1C" in the last 72 hours.  CBG: Recent Labs  Lab 01/21/24 1129 01/21/24 1721 01/21/24 2135 01/22/24 0729 01/22/24 1129  GLUCAP 142* 122* 154* 123* 119*   Lipid Profile: No results for input(s): "CHOL", "HDL", "LDLCALC", "TRIG", "CHOLHDL", "LDLDIRECT" in the last 72 hours. Thyroid  Function Tests: No results for input(s): "TSH", "T4TOTAL", "FREET4", "T3FREE", "THYROIDAB" in the last 72 hours. Anemia Panel: No results for input(s): "VITAMINB12", "FOLATE", "FERRITIN", "TIBC", "IRON ", "RETICCTPCT" in the last 72 hours. Urine analysis:    Component Value Date/Time   COLORURINE YELLOW (A) 05/20/2023 2137   APPEARANCEUR CLEAR (A) 05/20/2023 2137   LABSPEC 1.023 05/20/2023 2137   PHURINE 5.0 05/20/2023 2137   GLUCOSEU NEGATIVE 05/20/2023 2137   HGBUR NEGATIVE 05/20/2023 2137   BILIRUBINUR NEGATIVE 05/20/2023 2137   KETONESUR NEGATIVE 05/20/2023 2137   PROTEINUR NEGATIVE 05/20/2023 2137   NITRITE NEGATIVE 05/20/2023 2137   LEUKOCYTESUR NEGATIVE 05/20/2023 2137   Sepsis Labs: @LABRCNTIP (procalcitonin:4,lacticidven:4)  )No results found for this or any previous visit (from the past 240 hours).       Radiology Studies: No results found.       Scheduled Meds:  amoxicillin -clavulanate  1 tablet Oral BID   aspirin  EC  81 mg Oral Daily   atorvastatin   80 mg  Oral Daily   DULoxetine   30 mg Oral Daily   enoxaparin  (LOVENOX ) injection  40 mg Subcutaneous Q24H   feeding supplement  237 mL Oral BID BM   hydrochlorothiazide   25 mg Oral Daily   insulin  aspart  0-5 Units Subcutaneous QHS   insulin  aspart  0-9 Units Subcutaneous TID WC   latanoprost   1 drop Both Eyes QHS   mirtazapine   15 mg Oral QHS   polyethylene glycol  17 g Oral Daily   sertraline   50 mg Oral QHS   sodium chloride  flush  3 mL Intravenous Q12H   spironolactone   25 mg Oral Daily   timolol   1 drop Both Eyes BID   Continuous Infusions:     LOS: 0 days     Raymonde Calico, MD [facility]   If 7PM-7AM, please contact night-coverage [email or web address] Password [facility] 01/22/2024, 12:47 PM

## 2024-01-22 NOTE — Plan of Care (Signed)
 Plan of care is reviewed. Pt has been progressing. She is stable hemodynamically, afebrile, alert oriented x 3, no acute distress noted, no major complaints overnight. Pain is tolerated well.   Problem: Fluid Volume: Goal: Ability to maintain a balanced intake and output will improve Outcome: Progressing   Problem: Health Behavior/Discharge Planning: Goal: Ability to identify and utilize available resources and services will improve Outcome: Progressing Goal: Ability to manage health-related needs will improve Outcome: Progressing   Problem: Metabolic: Goal: Ability to maintain appropriate glucose levels will improve Outcome: Progressing   Problem: Nutritional: Goal: Maintenance of adequate nutrition will improve Outcome: Progressing Goal: Progress toward achieving an optimal weight will improve Outcome: Progressing   Problem: Skin Integrity: Goal: Risk for impaired skin integrity will decrease Outcome: Progressing   Problem: Clinical Measurements: Goal: Ability to maintain clinical measurements within normal limits will improve Outcome: Progressing Goal: Will remain free from infection Outcome: Progressing Goal: Diagnostic test results will improve Outcome: Progressing Goal: Respiratory complications will improve Outcome: Progressing Goal: Cardiovascular complication will be avoided Outcome: Progressing   Brain Cahill, RN

## 2024-01-22 NOTE — Progress Notes (Signed)
 Pt woke up with a nightmare, fearfully crying and yelling for help. She hyperventilated with anxiety. Pt stated " I don't want to take the medication before bedtime that can cause this feeling. I feel really scared. It could be the side effects those medications I took at bedtime. I don't want it anymore."   Emotional support was given. Pt asked for the name of her antidepressant medications. The name of the three medications for her antidepressant provided which are Zoloft , Remeron  and Cymbalta . Pt is alert and fully oriented x 3.   We will discuss with the day shift team to re-evaluate the psychological side effect.  Brain Cahill, RN

## 2024-01-23 ENCOUNTER — Other Ambulatory Visit: Payer: Self-pay

## 2024-01-23 ENCOUNTER — Encounter: Payer: Self-pay | Admitting: Internal Medicine

## 2024-01-23 DIAGNOSIS — S22080D Wedge compression fracture of T11-T12 vertebra, subsequent encounter for fracture with routine healing: Secondary | ICD-10-CM | POA: Diagnosis not present

## 2024-01-23 DIAGNOSIS — S22080A Wedge compression fracture of T11-T12 vertebra, initial encounter for closed fracture: Secondary | ICD-10-CM | POA: Diagnosis not present

## 2024-01-23 LAB — GLUCOSE, CAPILLARY
Glucose-Capillary: 119 mg/dL — ABNORMAL HIGH (ref 70–99)
Glucose-Capillary: 120 mg/dL — ABNORMAL HIGH (ref 70–99)

## 2024-01-23 MED ORDER — AMOXICILLIN-POT CLAVULANATE 500-125 MG PO TABS
1.0000 | ORAL_TABLET | Freq: Two times a day (BID) | ORAL | 0 refills | Status: AC
  Filled 2024-01-23: qty 4, 2d supply, fill #0

## 2024-01-23 NOTE — Discharge Summary (Signed)
 Wanda Monroe ZOX:[id number] DOB: 05/15/1941 DOA: 01/18/2024  PCP: Pcp, No  Admit date: 01/18/2024 Discharge date: 01/23/2024  Time spent: 35 minutes  Recommendations for Outpatient Follow-up:  Ent f/u 1 week Neurosurg f/u Pcp f/u     Discharge Diagnoses:  Principal Problem:   Thoracic compression fracture (HCC) Active Problems:   Type II diabetes mellitus with complication (HCC)   Hypertension   Hemorrhagic cerebrovascular accident (CVA) (HCC)   Tricuspid regurgitation   Temporal arteritis (HCC)   Rheumatoid arthritis (HCC)   Hospice care patient   Discharge Condition: stable  Diet recommendation: regular  Filed Weights   01/18/24 1208 01/18/24 2106  Weight: 58.1 kg 55.2 kg    History of present illness:  From admission h and p CATHE KOLLMANN is a 83 y.o. female with medical history significant for dementia, history hemorrhagic cva, htn, ra, dm, tr, hiatal hernia, on hospice, who presents with the above.   Tripped and fell a week ago, seen in our ER, diagnosed with thoracic compression fracture. Presents with another trip and fall today, complaining of left facial pain and numbness. No cough. No lower extremity weakness/numbness. No fevers. No chest pain.    Hospital Course:   # Left orbital and maxillary sinus fractures Dr Donnie Galea advises f/u 1 week, abs - cont augmentin  - ENT f/u 1 week   # T12 compression fracture Subacute. No sig pain. MRI shows to be stable fracture - TLSO brace when OOB - outpt neurosurg f/u   # ckd 3a Stable   # Manubrial fracture Normal troponins. No signs pnemo or other complication on CT. Mgmt is conservative   # Hospice patient - hospice following    # T2DM - SSI   # RA On methotrexate  outpt   # Hx CVA - cont statin, asa   # Hiatal hernia Asymptomatic   # HTN Bp normalized with resumption of home meds   # MDD - home duloxetine , mirtazapine , sertraline    # Disposition Doesn't qualify for snf. Requests discharge  home. Hospice to follow  Procedures: none   Consultations: none  Discharge Exam: Vitals:   01/23/24 0357 01/23/24 0749  BP: (!) 145/61 (!) 165/54  Pulse: 64 66  Resp: 19 16  Temp: 97.6 F (36.4 C) 98.1 F (36.7 C)  SpO2: 100% 100%    Constitutional: No acute distress Head: no apparent facial trauma Eyes: Conjunctiva clear ENM: poor dentition  Respiratory: Clear to auscultation bilaterally, save for rales at bases Cardiovascular: Regular rate and rhythm. Systolic murmur Abdomen: Non-tender, soft.   Musculoskeletal: No joint deformity upper and lower extremities.   Skin: bruising sternum and left face Extremities: No peripheral edema. warm Neurologic: Alert, moving all 4 extremities. Psychiatric: calm  Discharge Instructions    Allergies as of 01/23/2024       Reactions   Codeine Sulfate Nausea Only   Remicade [infliximab] Other (See Comments)   Infection        Medication List     STOP taking these medications    apixaban  2.5 MG Tabs tablet Commonly known as: Eliquis    ondansetron  4 MG tablet Commonly known as: ZOFRAN        TAKE these medications    acetaminophen  650 MG CR tablet Commonly known as: TYLENOL  Take 650 mg by mouth every 8 (eight) hours.   amLODipine 5 MG tablet Commonly known as: NORVASC Take 5 mg by mouth daily.   amoxicillin -clavulanate 500-125 MG tablet Commonly known as: AUGMENTIN  Take 1 tablet by mouth  2 (two) times daily for 2 days.   Aspercreme Lidocaine  4 % Generic drug: lidocaine  Place 1 patch onto the skin daily.   aspirin  81 MG tablet Take 81 mg by mouth daily. AM   atorvastatin  80 MG tablet Commonly known as: LIPITOR Take 80 mg by mouth daily. PM   bisacodyl  10 MG suppository Commonly known as: DULCOLAX Place 1 suppository (10 mg total) rectally daily as needed for moderate constipation.   calcium  citrate-vitamin D  315-200 MG-UNIT tablet Commonly known as: CITRACAL+D Take 1 tablet by mouth daily.    cyanocobalamin  1000 MCG tablet Commonly known as: VITAMIN B12 Take 1,000 mcg by mouth daily.   docusate sodium  100 MG capsule Commonly known as: COLACE Take 1 capsule (100 mg total) by mouth 2 (two) times daily.   DULoxetine  30 MG capsule Commonly known as: CYMBALTA  Take 30 mg by mouth daily.   ferrous sulfate  325 (65 FE) MG tablet Take 1 tablet (325 mg total) by mouth daily with breakfast.   folic acid  1 MG tablet Commonly known as: FOLVITE  Take 1 mg by mouth daily.   gabapentin  100 MG capsule Commonly known as: NEURONTIN  Take 200 mg by mouth 2 (two) times daily.   hydrochlorothiazide  25 MG tablet Commonly known as: HYDRODIURIL  Take 25 mg by mouth daily.   HYDROcodone -acetaminophen  5-325 MG tablet Commonly known as: NORCO/VICODIN Take 1 tablet by mouth every 6 (six) hours as needed for moderate pain.   ipratropium-albuterol 0.5-2.5 (3) MG/3ML Soln Commonly known as: DUONEB Take 3 mLs by nebulization every 6 (six) hours as needed (breathing).   Klor-Con  M20 20 MEQ tablet Generic drug: potassium chloride  SA Take 20 mEq by mouth 2 (two) times daily.   latanoprost  0.005 % ophthalmic solution Commonly known as: XALATAN  Place 1 drop into both eyes at bedtime.   LORazepam 0.5 MG tablet Commonly known as: ATIVAN Take 0.5 mg by mouth every 4 (four) hours as needed.   metFORMIN 500 MG tablet Commonly known as: GLUCOPHAGE Take 500 mg by mouth 2 (two) times daily with a meal.   methotrexate  2.5 MG tablet Commonly known as: RHEUMATREX Take 12.5 mg by mouth once a week. Thursday   mirtazapine  15 MG tablet Commonly known as: REMERON  Take 15 mg by mouth at bedtime.   oxyCODONE  5 MG immediate release tablet Commonly known as: Oxy IR/ROXICODONE  Take 5 mg by mouth 2 (two) times daily as needed for severe pain or moderate pain.   pantoprazole  40 MG tablet Commonly known as: PROTONIX  Take 1 tablet (40 mg total) by mouth daily.   sennosides-docusate sodium  8.6-50 MG  tablet Commonly known as: SENOKOT-S Take 1 tablet by mouth 2 (two) times daily.   sertraline  50 MG tablet Commonly known as: ZOLOFT  Take 50 mg by mouth at bedtime.   spironolactone  25 MG tablet Commonly known as: ALDACTONE  Take 25 mg by mouth daily.   timolol  0.5 % ophthalmic solution Commonly known as: TIMOPTIC  Place 1 drop into both eyes 2 (two) times daily.   tiZANidine  4 MG tablet Commonly known as: ZANAFLEX  Take 4 mg by mouth 3 (three) times daily as needed for muscle spasms.   vitamin D3 25 MCG tablet Commonly known as: CHOLECALCIFEROL  Take 1 tablet (1,000 Units total) by mouth daily.       Allergies  Allergen Reactions   Codeine Sulfate Nausea Only   Remicade [Infliximab] Other (See Comments)    Infection    Follow-up Information     Jodeen Munch, MD Follow up.  Specialty: Neurosurgery Contact information: [address] [place] [address] [phone]         Rogers Clayman, MD Follow up.   Specialty: Otolaryngology Why: 1 week Contact information: [address] [place] [address] [phone]                  The results of significant diagnostics from this hospitalization (including imaging, microbiology, ancillary and laboratory) are listed below for reference.    Significant Diagnostic Studies: MR THORACIC SPINE WO CONTRAST Result Date: 01/18/2024 CLINICAL DATA:  t12 compression fracture. EXAM: MRI THORACIC SPINE WITHOUT CONTRAST TECHNIQUE: Multiplanar, multisequence MR imaging of the thoracic spine was performed. No intravenous contrast was administered. COMPARISON:  Same day CT of the thoracic spine. FINDINGS: Alignment:  No substantial sagittal subluxation. Vertebrae: Acute/recent or unhealed T12 compression fracture with associated marrow edema and T2 hyperintense cleft along the superior endplate. Associated 25% height loss. Mild (3 mm) bony retropulsion with mild  resulting canal stenosis. Cord:  Normal cord signal. Paraspinal and other soft tissues: Partially imaged large hiatal hernia. Disc levels: Mild canal stenosis at T12 as detailed above. Otherwise, no significant canal or foraminal stenosis. IMPRESSION: Acute/recent or unhealed T12 compression fracture with associated marrow edema and T2 hyperintense cleft along the superior endplate. Associated 25% height loss and 3 mm bony retropulsion with mild resulting canal stenosis. Electronically Signed   By: Stevenson Elbe M.D.   On: 01/18/2024 23:27   DG Pelvis 1-2 Views Result Date: 01/18/2024 CLINICAL DATA:  Recent fall with pelvic pain, initial encounter EXAM: PELVIS - 1 VIEW COMPARISON:  CT from earlier in the same day. FINDINGS: Pelvic ring is intact. Diffuse vascular calcifications are seen. No acute abnormality is noted. Prior right hip replacement is noted. IMPRESSION: No acute abnormality noted. Electronically Signed   By: Violeta Grey M.D.   On: 01/18/2024 19:15   CT L-SPINE NO CHARGE Result Date: 01/18/2024 CLINICAL DATA:  Fall and back pain.  Left facial numbness. EXAM: CT THORACIC AND LUMBAR SPINE WITHOUT CONTRAST TECHNIQUE: Multidetector CT imaging of the thoracic and lumbar spine was performed without contrast. Multiplanar CT image reconstructions were also generated. RADIATION DOSE REDUCTION: This exam was performed according to the departmental dose-optimization program which includes automated exposure control, adjustment of the mA and/or kV according to patient size and/or use of iterative reconstruction technique. COMPARISON:  Chest radiograph dated 01/18/2024 and CT abdomen pelvis dated 01/11/2024. FINDINGS: CT THORACIC SPINE FINDINGS Alignment: No acute subluxation. Vertebrae: Progression of T12 superior endplate compression fracture compared to prior CT with extension of the fracture to the anterior vertebral body. There is approximately 10% loss of vertebral body height. There is a 4 mm  retropulsion of the posterosuperior endplate. No other acute thoracic spine fracture. The bones are osteopenic. Paraspinal and other soft tissues: Mild standing adjacent to T12. No paraspinal fluid collection or hematoma. Disc levels: Multilevel degenerative changes. CT LUMBAR SPINE FINDINGS Segmentation: 5 lumbar type vertebrae. Alignment: No acute subluxation. Vertebrae: No acute fracture.  Osteopenia. Paraspinal and other soft tissues: No acute findings. Disc levels: Multilevel degenerative changes and disc desiccation and vacuum phenomena. IMPRESSION: 1. Progression of T12 superior endplate compression fracture since the prior CT with extension of the fracture to the anterior vertebral body. There is a 4 mm retropulsion of the posterior superior endplate. 2. No acute/traumatic lumbar spine pathology. Electronically Signed   By: Angus Bark M.D.   On: 01/18/2024 17:16   CT T-SPINE NO  CHARGE Result Date: 01/18/2024 CLINICAL DATA:  Fall and back pain.  Left facial numbness. EXAM: CT THORACIC AND LUMBAR SPINE WITHOUT CONTRAST TECHNIQUE: Multidetector CT imaging of the thoracic and lumbar spine was performed without contrast. Multiplanar CT image reconstructions were also generated. RADIATION DOSE REDUCTION: This exam was performed according to the departmental dose-optimization program which includes automated exposure control, adjustment of the mA and/or kV according to patient size and/or use of iterative reconstruction technique. COMPARISON:  Chest radiograph dated 01/18/2024 and CT abdomen pelvis dated 01/11/2024. FINDINGS: CT THORACIC SPINE FINDINGS Alignment: No acute subluxation. Vertebrae: Progression of T12 superior endplate compression fracture compared to prior CT with extension of the fracture to the anterior vertebral body. There is approximately 10% loss of vertebral body height. There is a 4 mm retropulsion of the posterosuperior endplate. No other acute thoracic spine fracture. The bones are  osteopenic. Paraspinal and other soft tissues: Mild standing adjacent to T12. No paraspinal fluid collection or hematoma. Disc levels: Multilevel degenerative changes. CT LUMBAR SPINE FINDINGS Segmentation: 5 lumbar type vertebrae. Alignment: No acute subluxation. Vertebrae: No acute fracture.  Osteopenia. Paraspinal and other soft tissues: No acute findings. Disc levels: Multilevel degenerative changes and disc desiccation and vacuum phenomena. IMPRESSION: 1. Progression of T12 superior endplate compression fracture since the prior CT with extension of the fracture to the anterior vertebral body. There is a 4 mm retropulsion of the posterior superior endplate. 2. No acute/traumatic lumbar spine pathology. Electronically Signed   By: Angus Bark M.D.   On: 01/18/2024 17:16   CT CHEST ABDOMEN PELVIS W CONTRAST Result Date: 01/18/2024 CLINICAL DATA:  Fall today. EXAM: CT CHEST, ABDOMEN, AND PELVIS WITH CONTRAST TECHNIQUE: Multidetector CT imaging of the chest, abdomen and pelvis was performed following the standard protocol during bolus administration of intravenous contrast. RADIATION DOSE REDUCTION: This exam was performed according to the departmental dose-optimization program which includes automated exposure control, adjustment of the mA and/or kV according to patient size and/or use of iterative reconstruction technique. CONTRAST:  75mL OMNIPAQUE  IOHEXOL  300 MG/ML  SOLN COMPARISON:  Jan 11, 2024. FINDINGS: CT CHEST FINDINGS Cardiovascular: Atherosclerosis of thoracic aorta without aneurysm or dissection. Normal cardiac size. Coronary artery calcifications are noted. No pericardial effusion. Mediastinum/Nodes: Large sliding-type hiatal hernia. No adenopathy. Thyroid  gland is unremarkable. Lungs/Pleura: No pneumothorax or pleural effusion is noted. Probable mild atelectasis or scarring is noted in right middle lobe and lingular segment of left upper lobe. Musculoskeletal: Probable mildly displaced manubrial  fracture is noted. Probable mild depression of superior endplate of T12 vertebral body concerning for acute to subacute fracture as noted on prior CT scan. CT ABDOMEN PELVIS FINDINGS Hepatobiliary: No focal liver abnormality is seen. No gallstones, gallbladder wall thickening, or biliary dilatation. Pancreas: Unremarkable. No pancreatic ductal dilatation or surrounding inflammatory changes. Spleen: Normal in size without focal abnormality. Adrenals/Urinary Tract: Adrenal glands are unremarkable. Kidneys are normal, without renal calculi, focal lesion, or hydronephrosis. Bladder is unremarkable. Stomach/Bowel: Stomach is unremarkable. There is no evidence of bowel obstruction or inflammation. Status post appendectomy. Vascular/Lymphatic: Aortic atherosclerosis. No enlarged abdominal or pelvic lymph nodes. Reproductive: Status post hysterectomy. No adnexal masses. Other: No ascites or hernia is noted. Musculoskeletal: Status post right total hip arthroplasty. Multilevel degenerative changes are noted in lumbar spine. IMPRESSION: Mildly displaced manubrial fracture is noted. Mild superior endplate depression of T12 vertebral body is noted which was present on prior exam of Jan 11, 2024, and is concerning for acute to subacute compression fracture. Large sliding-type hiatal hernia.  Coronary artery calcifications are noted suggesting coronary artery disease. Aortic Atherosclerosis (ICD10-I70.0). Electronically Signed   By: Rosalene Colon M.D.   On: 01/18/2024 16:56   CT Maxillofacial Wo Contrast Result Date: 01/18/2024 CLINICAL DATA:  Trauma.  Fall. EXAM: CT MAXILLOFACIAL WITHOUT CONTRAST TECHNIQUE: Multidetector CT imaging of the maxillofacial structures was performed. Multiplanar CT image reconstructions were also generated. RADIATION DOSE REDUCTION: This exam was performed according to the departmental dose-optimization program which includes automated exposure control, adjustment of the mA and/or kV according to  patient size and/or use of iterative reconstruction technique. COMPARISON:  None Available. FINDINGS: Osseous: There is a left inferior orbital wall fracture, minimally depressed/displaced. There is some fat herniation into the sinus. There is also acute nondisplaced fracture of the lateral wall the left maxillary sinus in minimally depressed acute fracture of the anterior wall of the left maxillary sinus. There is also subtle nondisplaced fracture of the lateral left orbital wall. There is no dislocation. Orbits: There is mild orbital edema inferiorly. No evidence for muscle entrapment or focal hematoma. The globes appear intact bilaterally. No foreign body Sinuses: There is a hyperdense air-fluid level in the left maxillary sinus. Soft tissues: No focal hematoma or foreign body. Limited intracranial: No significant or unexpected finding. IMPRESSION: 1. Acute fractures of the left inferior orbital wall, anterior wall of the left maxillary sinus, and lateral wall of the left orbit. 2. Hyperdense air-fluid level in the left maxillary sinus compatible with hemorrhage. Electronically Signed   By: Tyron Gallon M.D.   On: 01/18/2024 16:51   DG Chest 2 View Result Date: 01/18/2024 CLINICAL DATA:  Provided history: Fall. Chest pain. Additional history provided: Left lower rib pain after fall this morning. EXAM: CHEST - 2 VIEW COMPARISON:  Prior chest radiographs 01/11/2024 and earlier. Lumbar spine CT 01/11/2024. FINDINGS: Heart size within normal limits. Aortic atherosclerosis. Large hiatal hernia. No appreciable airspace consolidation. No evidence of pleural effusion or pneumothorax. Anterior wedge vertebral compression fracture within the lower thoracic or upper lumbar spine (with up to 40% vertebral body height loss), new or progressed since the lumbar spine CT of 01/11/2024. No other acute osseous abnormality is identified. IMPRESSION: 1. No evidence of an acute cardiopulmonary abnormality. 2. Vertebral compression  fracture within the lower thoracic or upper lumbar spine (with up to 40% vertebral body height loss), new or progressed since the lumbar spine CT of 01/11/2024. Dedicated spinal imaging recommended. 3. Please note, this examination is not specifically tailored for the assessment of the ribs. Rib radiographs may be obtained for further evaluation, as clinically warranted. 4. Large hiatal hernia. 5. Aortic Atherosclerosis (ICD10-I70.0). Electronically Signed   By: Bascom Lily D.O.   On: 01/18/2024 13:21   CT HEAD WO CONTRAST ( ) Result Date: 01/18/2024 CLINICAL DATA:  Head and neck trauma EXAM: CT HEAD WITHOUT CONTRAST CT CERVICAL SPINE WITHOUT CONTRAST TECHNIQUE: Multidetector CT imaging of the head and cervical spine was performed following the standard protocol without intravenous contrast. Multiplanar CT image reconstructions of the cervical spine were also generated. RADIATION DOSE REDUCTION: This exam was performed according to the departmental dose-optimization program which includes automated exposure control, adjustment of the mA and/or kV according to patient size and/or use of iterative reconstruction technique. COMPARISON:  01/11/2024 FINDINGS: CT HEAD FINDINGS Brain: No evidence of acute infarction, hemorrhage, hydrocephalus, extra-axial collection or mass lesion/mass effect. Periventricular and deep white matter hypodensity. Unchanged right occipital and right cerebellar hemisphere encephalomalacia. Vascular: No hyperdense vessel or unexpected calcification. Skull: Normal. Negative  for fracture or focal lesion. Sinuses/Orbits: Intermediate attenuation left maxillary sinus air-fluid level. Overlying irregularity of the maxillary sinus walls, of uncertain acuity (series 5, image 5) Other: None. CT CERVICAL SPINE FINDINGS Alignment: Normal. Skull base and vertebrae: No acute fracture. No primary bone lesion or focal pathologic process. Soft tissues and spinal canal: No prevertebral fluid or swelling.  No visible canal hematoma. Disc levels:  Intact. Upper chest: Negative. Other: None. IMPRESSION: 1. No acute intracranial pathology. 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 2. Intermediate attenuation left maxillary sinus air-fluid level. Overlying irregularity of the maxillary sinus walls, of uncertain acuity. Correlate for point tenderness and consider dedicated maxillofacial CT to further evaluate. 3. No fracture or static subluxation of the cervical spine. Electronically Signed   By: Fredricka Jenny M.D.   On: 01/18/2024 13:06   CT Cervical Spine Wo Contrast Result Date: 01/18/2024 CLINICAL DATA:  Head and neck trauma EXAM: CT HEAD WITHOUT CONTRAST CT CERVICAL SPINE WITHOUT CONTRAST TECHNIQUE: Multidetector CT imaging of the head and cervical spine was performed following the standard protocol without intravenous contrast. Multiplanar CT image reconstructions of the cervical spine were also generated. RADIATION DOSE REDUCTION: This exam was performed according to the departmental dose-optimization program which includes automated exposure control, adjustment of the mA and/or kV according to patient size and/or use of iterative reconstruction technique. COMPARISON:  01/11/2024 FINDINGS: CT HEAD FINDINGS Brain: No evidence of acute infarction, hemorrhage, hydrocephalus, extra-axial collection or mass lesion/mass effect. Periventricular and deep white matter hypodensity. Unchanged right occipital and right cerebellar hemisphere encephalomalacia. Vascular: No hyperdense vessel or unexpected calcification. Skull: Normal. Negative for fracture or focal lesion. Sinuses/Orbits: Intermediate attenuation left maxillary sinus air-fluid level. Overlying irregularity of the maxillary sinus walls, of uncertain acuity (series 5, image 5) Other: None. CT CERVICAL SPINE FINDINGS Alignment: Normal. Skull base and vertebrae: No acute fracture. No primary  bone lesion or focal pathologic process. Soft tissues and spinal canal: No prevertebral fluid or swelling. No visible canal hematoma. Disc levels:  Intact. Upper chest: Negative. Other: None. IMPRESSION: 1. No acute intracranial pathology. 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 2. Intermediate attenuation left maxillary sinus air-fluid level. Overlying irregularity of the maxillary sinus walls, of uncertain acuity. Correlate for point tenderness and consider dedicated maxillofacial CT to further evaluate. 3. No fracture or static subluxation of the cervical spine. Electronically Signed   By: Fredricka Jenny M.D.   On: 01/18/2024 13:06   CT L-SPINE NO CHARGE Result Date: 01/11/2024 CLINICAL DATA:  Trauma.  Low back pain. EXAM: CT Lumbar Spine with contrast TECHNIQUE: Technique: Multiplanar CT images of the lumbar spine were reconstructed from contemporary CT of the Abdomen and Pelvis. RADIATION DOSE REDUCTION: This exam was performed according to the departmental dose-optimization program which includes automated exposure control, adjustment of the mA and/or kV according to patient size and/or use of iterative reconstruction technique. CONTRAST:  None or No additional COMPARISON:  None Available. FINDINGS: Segmentation: 5 lumbar type vertebrae. Alignment: Normal. Vertebrae: There is acute mild compression fracture deformity of T12 vertebral body. The fracture line involves mainly the superior endplate with extension into the left-sided bridging osteophyte. No significant retropulsion or spinal canal compromise. There is also subtle undisplaced acute fracture of the right posteroinferior corner of the T11 vertebral body (series 6, image 28 and series 5, image 43). There is no significant loss of vertebral body height. Remaining vertebral body heights are maintained. No  aggressive osseous lesion. Paraspinal and other soft tissues: Negative. Disc  levels: Mild-to-moderate multilevel degenerative changes characterized by reduced intervertebral disc height, vacuum phenomena, facet arthropathy and marginal osteophyte formation. IMPRESSION: 1. Acute mild compression fracture deformity of T12 vertebral body. No significant retropulsion or spinal canal compromise. 2. Probable subtle undisplaced acute fracture of the right posteroinferior corner of T11 vertebral body. No significant loss of vertebral body height. No retropulsion of fractured fragments. 3. Mild-to-moderate multilevel degenerative changes. Electronically Signed   By: Beula Brunswick M.D.   On: 01/11/2024 14:21   CT ABDOMEN PELVIS W CONTRAST Result Date: 01/11/2024 CLINICAL DATA:  Abdominal trauma, blunt. Fall. Right hip and low back pain. EXAM: CT ABDOMEN AND PELVIS WITH CONTRAST TECHNIQUE: Multidetector CT imaging of the abdomen and pelvis was performed using the standard protocol following bolus administration of intravenous contrast. RADIATION DOSE REDUCTION: This exam was performed according to the departmental dose-optimization program which includes automated exposure control, adjustment of the mA and/or kV according to patient size and/or use of iterative reconstruction technique. CONTRAST:  75mL OMNIPAQUE  IOHEXOL  300 MG/ML  SOLN COMPARISON:  CT scan abdomen and pelvis from 04/11/2022. FINDINGS: Lower chest: The lung bases are clear. No pleural effusion. The heart is normal in size. No pericardial effusion. Hepatobiliary: The liver is normal in size. Non-cirrhotic configuration. No suspicious mass. These is mild diffuse hepatic steatosis. No intrahepatic bile duct dilation. There is mild prominence of the extrahepatic bile duct, most likely due to post cholecystectomy status. Gallbladder is surgically absent. Pancreas: Small/atrophic pancreas. No focal mass. No pancreatic ductal dilatation or surrounding inflammatory changes. Spleen: Within normal limits. No focal lesion. Adrenals/Urinary  Tract: Adrenal glands are unremarkable. No suspicious renal mass. Partially exophytic cyst again noted arising from the right kidney interpolar region, laterally measuring 5 x 7 mm. No hydronephrosis. No renal or ureteric calculi. Unremarkable urinary bladder. Stomach/Bowel: No disproportionate dilation of the small or large bowel loops. No evidence of abnormal bowel wall thickening or inflammatory changes. The appendix was not visualized; however there is no acute inflammatory process in the right lower quadrant. There are multiple diverticula mainly in the sigmoid colon, without imaging signs of diverticulitis. Vascular/Lymphatic: No ascites or pneumoperitoneum. No abdominal or pelvic lymphadenopathy, by size criteria. No aneurysmal dilation of the major abdominal arteries. There are moderate peripheral atherosclerotic vascular calcifications of the aorta and its major branches. Reproductive: The uterus is surgically absent. No large adnexal mass. Other: There is a tiny fat containing umbilical hernia. The soft tissues and abdominal wall are otherwise unremarkable. Musculoskeletal: No suspicious osseous lesions. There are moderate multilevel degenerative changes in the visualized spine. Note is made of right hip arthroplasty. There is mild compression deformity of T12 vertebral body, described in detail on the lumbar spine CT scan report. IMPRESSION: 1. No traumatic injury to the abdomen or pelvis. 2. Mild compression deformity of T12 vertebral body, described in detail on the lumbar spine CT scan report. 3. Multiple other nonacute observations, as described above. 4. Aortic atherosclerosis. Electronically Signed   By: Beula Brunswick M.D.   On: 01/11/2024 14:16   CT HEAD WO CONTRAST ( ) Result Date: 01/11/2024 CLINICAL DATA:  Neck trauma (Age >= 65y); Headache, new onset (Age >= 51y) EXAM: CT HEAD WITHOUT CONTRAST CT CERVICAL SPINE WITHOUT CONTRAST TECHNIQUE: Multidetector CT imaging of the head and cervical  spine was performed following the standard protocol without intravenous contrast. Multiplanar CT image reconstructions of the cervical spine were also generated. RADIATION DOSE REDUCTION: This exam was  performed according to the departmental dose-optimization program which includes automated exposure control, adjustment of the mA and/or kV according to patient size and/or use of iterative reconstruction technique. COMPARISON:  CT scan head and cervical spine from 05/20/2023. FINDINGS: CT HEAD FINDINGS Brain: No evidence of acute infarction, hemorrhage, hydrocephalus, extra-axial collection or mass lesion/mass effect. There is bilateral periventricular hypodensity, which is non-specific but most likely seen in the settings of microvascular ischemic changes. Moderate in extent. Redemonstration of chronic encephalomalacia in the right cerebellum and right occipital parietal region. There also additional chronic lacunar infarcts in the bilateral cerebellum. Otherwise normal appearance of brain parenchyma. Ventricles are normal. Cerebral volume is age appropriate. Vascular: No hyperdense vessel or unexpected calcification. Intracranial arteriosclerosis. Skull: Normal. Negative for fracture or focal lesion. Sinuses/Orbits: No acute finding. Other: Visualized mastoid air cells are unremarkable. No mastoid effusion. CT CERVICAL SPINE FINDINGS Alignment: Normal. This examination does not assess for ligamentous injury or stability. Skull base and vertebrae: No acute fracture. No primary bone lesion or focal pathologic process. Soft tissues and spinal canal: No prevertebral fluid or swelling. No visible canal hematoma. Disc levels: Mild-to-moderate degenerative changes noted characterized by facet arthropathy and marginal osteophyte formation. Upper chest: Negative. Other: None. IMPRESSION: *No acute intracranial abnormality. *No acute osseous injury or traumatic listhesis of the cervical spine. Electronically Signed   By:  Beula Brunswick M.D.   On: 01/11/2024 14:08   CT Cervical Spine Wo Contrast Result Date: 01/11/2024 CLINICAL DATA:  Neck trauma (Age >= 65y); Headache, new onset (Age >= 51y) EXAM: CT HEAD WITHOUT CONTRAST CT CERVICAL SPINE WITHOUT CONTRAST TECHNIQUE: Multidetector CT imaging of the head and cervical spine was performed following the standard protocol without intravenous contrast. Multiplanar CT image reconstructions of the cervical spine were also generated. RADIATION DOSE REDUCTION: This exam was performed according to the departmental dose-optimization program which includes automated exposure control, adjustment of the mA and/or kV according to patient size and/or use of iterative reconstruction technique. COMPARISON:  CT scan head and cervical spine from 05/20/2023. FINDINGS: CT HEAD FINDINGS Brain: No evidence of acute infarction, hemorrhage, hydrocephalus, extra-axial collection or mass lesion/mass effect. There is bilateral periventricular hypodensity, which is non-specific but most likely seen in the settings of microvascular ischemic changes. Moderate in extent. Redemonstration of chronic encephalomalacia in the right cerebellum and right occipital parietal region. There also additional chronic lacunar infarcts in the bilateral cerebellum. Otherwise normal appearance of brain parenchyma. Ventricles are normal. Cerebral volume is age appropriate. Vascular: No hyperdense vessel or unexpected calcification. Intracranial arteriosclerosis. Skull: Normal. Negative for fracture or focal lesion. Sinuses/Orbits: No acute finding. Other: Visualized mastoid air cells are unremarkable. No mastoid effusion. CT CERVICAL SPINE FINDINGS Alignment: Normal. This examination does not assess for ligamentous injury or stability. Skull base and vertebrae: No acute fracture. No primary bone lesion or focal pathologic process. Soft tissues and spinal canal: No prevertebral fluid or swelling. No visible canal hematoma. Disc  levels: Mild-to-moderate degenerative changes noted characterized by facet arthropathy and marginal osteophyte formation. Upper chest: Negative. Other: None. IMPRESSION: *No acute intracranial abnormality. *No acute osseous injury or traumatic listhesis of the cervical spine. Electronically Signed   By: Beula Brunswick M.D.   On: 01/11/2024 14:08   DG Hip Unilat W or Wo Pelvis 2-3 Views Right Result Date: 01/11/2024 CLINICAL DATA:  Fall. EXAM: DG HIP (WITH OR WITHOUT PELVIS) 2-3V RIGHT COMPARISON:  05/21/2023. FINDINGS: Diffuse osseous demineralization. Evaluation of the sacrum is somewhat limited due to overlying bowel-gas. No acute  fracture or dislocation. Status post right hip arthroplasty with intact hardware and appropriate alignment. Degenerative changes of the visualized lower lumbar spine. Vascular calcifications are noted. IMPRESSION: No acute osseous abnormality.  Intact right hip arthroplasty. Electronically Signed   By: Mannie Seek M.D.   On: 01/11/2024 12:46   DG Chest Portable 1 View Result Date: 01/11/2024 CLINICAL DATA:  Fall. EXAM: PORTABLE CHEST 1 VIEW COMPARISON:  05/20/2023. FINDINGS: The heart size and mediastinal contours are within normal limits. Aortic atherosclerosis. Moderate-sized hiatal hernia is again noted. No focal consolidation, pleural effusion, or pneumothorax. Diffuse osseous demineralization. Degenerative changes of the bilateral glenohumeral joints. No acute osseous abnormality. IMPRESSION: No acute findings in the chest. Electronically Signed   By: Mannie Seek M.D.   On: 01/11/2024 12:44    Microbiology: No results found for this or any previous visit (from the past 240 hours).   Labs: Basic Metabolic Panel: Recent Labs  Lab 01/18/24 1215 01/19/24 0324 01/20/24 0517 01/21/24 0424  NA 133* 134* 132* 131*  K 4.4 4.3 4.0 3.4*  CL 96* 97* 98 95*  CO2 25 25 27 25   GLUCOSE 110* 116* 110* 136*  BUN 21 22 27* 37*  CREATININE 1.33* 1.31* 1.21* 1.18*   CALCIUM  9.5 9.3 8.8* 8.8*   Liver Function Tests: Recent Labs  Lab 01/18/24 1215 01/19/24 0324  AST 20 18  ALT 10 9  ALKPHOS 91 89  BILITOT 0.5 0.5  PROT 7.1 6.7  ALBUMIN 3.6 3.3*   No results for input(s): "LIPASE", "AMYLASE" in the last 168 hours. No results for input(s): "AMMONIA" in the last 168 hours. CBC: Recent Labs  Lab 01/18/24 1215 01/19/24 0324  WBC 8.6 8.1  NEUTROABS 7.2  --   HGB 9.3* 9.2*  HCT 30.1* 30.5*  MCV 90.1 91.3  PLT 457* 441*   Cardiac Enzymes: Recent Labs  Lab 01/18/24 1215  CKTOTAL 56   BNP: BNP (last 3 results) No results for input(s): "BNP" in the last 8760 hours.  ProBNP (last 3 results) No results for input(s): "PROBNP" in the last 8760 hours.  CBG: Recent Labs  Lab 01/22/24 1129 01/22/24 1648 01/22/24 2120 01/23/24 0750 01/23/24 1148  GLUCAP 119* 121* 135* 119* 120*       Signed:  Raymonde Calico MD.  [facility] 01/23/2024, 2:27 PM

## 2024-01-23 NOTE — TOC Progression Note (Signed)
 Transition of Care [facility]) - Progression Note    Patient Details  Name: Wanda Monroe MRN: [id number] Date of Birth: [birth year]  Transition of [facility]) CM/SW Contact  Wanda Ice, RN Phone Number: 01/23/2024, 1:04 PM  Clinical Narrative:     Wanda Monroe, nephew, stated Wanda Monroe is currently at work and not available to talk until after 4pm. TOC explained that patient is not able to discharge SNF due observation status and insurance; he verbalized understanding. TOC provided option of home with resumption of hospice vs home health services. Also discussed private caregiver services. He stated is unsure if finances and would like TOC to discuss with Wanda Monroe, Wanda Monroe.   Expected Discharge Plan: Skilled Nursing Facility Barriers to Discharge: Continued Medical Work up  Expected Discharge Plan and Services In-house Referral: Clinical Social Work   Post Acute Care Choice: Skilled Nursing Facility Living arrangements for the past 2 months: Mobile Home                 DME Arranged: N/A DME Agency: NA                   Social Determinants of Health (SDOH) Interventions SDOH Screenings   Food Insecurity: Food Insecurity Present (01/19/2024)  Housing: Low Risk  (01/19/2024)  Transportation Needs: No Transportation Needs (01/19/2024)  Utilities: Not At Risk (01/19/2024)  Social Connections: Socially Isolated (01/19/2024)  Tobacco Use: Low Risk  (01/18/2024)    Readmission Risk Interventions     No data to display

## 2024-01-23 NOTE — Progress Notes (Signed)
 Discharge instructions given to and reviewed with patient and her neighbor. Both verbalized understanding of all discharge instructions including follow up care, changes to medications and new prescription. Discharged home with neighbor driving her.

## 2024-01-29 DIAGNOSIS — J449 Chronic obstructive pulmonary disease, unspecified: Secondary | ICD-10-CM | POA: Diagnosis present

## 2024-01-30 ENCOUNTER — Encounter: Payer: Self-pay | Admitting: Internal Medicine

## 2024-02-01 ENCOUNTER — Other Ambulatory Visit: Payer: Self-pay

## 2024-02-01 DIAGNOSIS — S22080S Wedge compression fracture of T11-T12 vertebra, sequela: Secondary | ICD-10-CM

## 2024-02-02 NOTE — Progress Notes (Deleted)
 Referring Physician:  Jodeen Munch, MD [address] [place],  [place] [address]  Primary Physician:  [facility]  History of Present Illness: 02/02/2024*** Wanda Monroe has a history of HTN, CVA, hiatal hernia with GERD, DM, peripheral neuropathy, osteoporosis, RA, hyperlipidemia, CKD 3a.    History of lumbar laminectomy.   Seen as hospital consult by Dr. Mont Antis on 01/19/24 for T12 compression fracture s/p fall on 01/11/24. She had repeat fall on 01/18/24 and was admitted. Also with left orbital and maxillary sinus fractures.   TLSO brace was recommended when she was out of bed.   She is here for follow up.     She is taking *** for pain.    Duration: *** Location: *** Quality: *** Severity: ***  Precipitating: aggravated by *** Modifying factors: made better by *** Weakness: none Timing: ***  She does not smoke.   Bowel/Bladder Dysfunction: none  Past Surgery:  Lumbar laminectomy  Wanda Monroe has ***no symptoms of cervical myelopathy.  The symptoms are causing a significant impact on the patient's life.   Review of Systems:  A 10 point review of systems is negative, except for the pertinent positives and negatives detailed in the HPI.  Past Medical History: Past Medical History:  Diagnosis Date   Anemia    Arthritis    rhuematoid,OSTEOARTHRITIS   Cellulitis    Diabetes mellitus without complication (HCC)    type II   GERD (gastroesophageal reflux disease)    Gout    Hiatal hernia    Hyperlipidemia    Hypertension    Neuromuscular disorder (HCC)    left foot neuropathy   Osteoporosis    Rosacea    Spinal stenosis of lumbar region    Stroke [facility])    x2, 9/10   Temporal arteritis (HCC)    Wears dentures    full top, partial bottom    Past Surgical History: Past Surgical History:  Procedure Laterality Date   ABDOMINAL HYSTERECTOMY     APPENDECTOMY     BACK SURGERY     lumbar and cervical  laminectomies   BREAST BIOPSY Left 1960,1980   twice negative results   CAPSULOTOMY METATARSOPHALANGEAL Left 01/22/2015   Procedure: CAPSULOTOMY METATARSOPHALANGEAL;  Surgeon: Sharlyn Deaner, DPM;  Location: [facility];  Service: Podiatry;  Laterality: Left;   CATARACT EXTRACTION W/PHACO Left 10/19/2016   Procedure: CATARACT EXTRACTION PHACO AND INTRAOCULAR LENS PLACEMENT (IOC)  Left Eye  Diabetic;  Surgeon: Annell Kidney, MD;  Location: [facility];  Service: Ophthalmology;  Laterality: Left;  Diabetic - oral meds Left Eye   CATARACT EXTRACTION W/PHACO Right 11/16/2016   Procedure: CATARACT EXTRACTION PHACO AND INTRAOCULAR LENS PLACEMENT (IOC) dIABETIC;  Surgeon: Annell Kidney, MD;  Location: [facility];  Service: Ophthalmology;  Laterality: Right;  DIABETES - oral meds   COLONOSCOPY WITH PROPOFOL  N/A 04/24/2018   Procedure: COLONOSCOPY WITH PROPOFOL ;  Surgeon: Toledo, Alphonsus Jeans, MD;  Location: [facility] END[facility];  Service: Gastroenterology;  Laterality: N/A;   ESOPHAGOGASTRODUODENOSCOPY N/A 07/01/2021   Procedure: ESOPHAGOGASTRODUODENOSCOPY (EGD);  Surgeon: Conrado Delay, DO;  Location: [facility] ENDOS[facility];  Service: General;  Laterality: N/A;   ESOPHAGOGASTRODUODENOSCOPY (EGD) WITH PROPOFOL  N/A 04/24/2018   Procedure: ESOPHAGOGASTRODUODENOSCOPY (EGD) WITH PROPOFOL ;  Surgeon: Toledo, Alphonsus Jeans, MD;  Location: [facility] END[facility];  Service: Gastroenterology;  Laterality: N/A;   FOOT SURGERY     HALLUX VALGUS AKIN Left 01/22/2015   Procedure: HALLUX VALGUS AKIN;  Surgeon: Sharlyn Deaner, DPM;  Location: [facility];  Service: Podiatry;  Laterality: Left;  left great toe   HEMORRHOID SURGERY     HIP ARTHROPLASTY Right 05/21/2023   Procedure: ARTHROPLASTY BIPOLAR HIP (HEMIARTHROPLASTY);  Surgeon: Elner Hahn, MD;  Location: [facility] ORS;  Service: Orthopedics;  Laterality: Right;   KNEE ARTHROSCOPY     LUMBAR LAMINECTOMY  04/23/13   NOSE SURGERY     TONSILLECTOMY      URETER SURGERY      Allergies: Allergies as of 02/06/2024 - Review Complete 01/22/2024  Allergen Reaction Noted   Codeine sulfate Nausea Only 01/15/2015   Remicade [infliximab] Other (See Comments) 01/15/2015    Medications: Outpatient Encounter Medications as of 02/06/2024  Medication Sig   acetaminophen  (TYLENOL ) 650 MG CR tablet Take 650 mg by mouth every 8 (eight) hours.   amLODipine (NORVASC) 5 MG tablet Take 5 mg by mouth daily.   ASPERCREME LIDOCAINE  4 % Place 1 patch onto the skin daily.   aspirin  81 MG tablet Take 81 mg by mouth daily. AM   atorvastatin  (LIPITOR) 80 MG tablet Take 80 mg by mouth daily. PM   bisacodyl  (DULCOLAX) 10 MG suppository Place 1 suppository (10 mg total) rectally daily as needed for moderate constipation.   calcium  citrate-vitamin D  (CITRACAL+D) 315-200 MG-UNIT per tablet Take 1 tablet by mouth daily.   cholecalciferol  (CHOLECALCIFEROL ) 25 MCG tablet Take 1 tablet (1,000 Units total) by mouth daily.   docusate sodium  (COLACE) 100 MG capsule Take 1 capsule (100 mg total) by mouth 2 (two) times daily.   DULoxetine  (CYMBALTA ) 30 MG capsule Take 30 mg by mouth daily.   ferrous sulfate  325 (65 FE) MG tablet Take 1 tablet (325 mg total) by mouth daily with breakfast.   folic acid  (FOLVITE ) 1 MG tablet Take 1 mg by mouth daily.   gabapentin  (NEURONTIN ) 100 MG capsule Take 200 mg by mouth 2 (two) times daily.   hydrochlorothiazide  (HYDRODIURIL ) 25 MG tablet Take 25 mg by mouth daily.   HYDROcodone -acetaminophen  (NORCO/VICODIN) 5-325 MG tablet Take 1 tablet by mouth every 6 (six) hours as needed for moderate pain. (Patient not taking: Reported on 01/18/2024)   ipratropium-albuterol (DUONEB) 0.5-2.5 (3) MG/3ML SOLN Take 3 mLs by nebulization every 6 (six) hours as needed (breathing).   KLOR-CON  M20 20 MEQ tablet Take 20 mEq by mouth 2 (two) times daily.   latanoprost  (XALATAN ) 0.005 % ophthalmic solution Place 1 drop into both eyes at bedtime.   LORazepam (ATIVAN)  0.5 MG tablet Take 0.5 mg by mouth every 4 (four) hours as needed.   metFORMIN (GLUCOPHAGE) 500 MG tablet Take 500 mg by mouth 2 (two) times daily with a meal.   methotrexate  (RHEUMATREX) 2.5 MG tablet Take 12.5 mg by mouth once a week. Thursday   mirtazapine  (REMERON ) 15 MG tablet Take 15 mg by mouth at bedtime.   oxyCODONE  (OXY IR/ROXICODONE ) 5 MG immediate release tablet Take 5 mg by mouth 2 (two) times daily as needed for severe pain or moderate pain.   pantoprazole  (PROTONIX ) 40 MG tablet Take 1 tablet (40 mg total) by mouth daily.   sennosides-docusate sodium  (SENOKOT-S) 8.6-50 MG tablet Take 1 tablet by mouth 2 (two) times daily.   sertraline  (ZOLOFT ) 50 MG tablet Take 50 mg by mouth at bedtime.   spironolactone  (ALDACTONE ) 25 MG tablet Take 25 mg by mouth daily.   timolol  (TIMOPTIC ) 0.5 % ophthalmic solution Place 1 drop into both eyes 2 (two) times daily.   tiZANidine  (ZANAFLEX ) 4 MG tablet Take 4 mg  by mouth 3 (three) times daily as needed for muscle spasms.   vitamin B-12 (CYANOCOBALAMIN ) 1000 MCG tablet Take 1,000 mcg by mouth daily.   No facility-administered encounter medications on file as of 02/06/2024.    Social History: Social History   Tobacco Use   Smoking status: Never   Smokeless tobacco: Never  Vaping Use   Vaping status: Never Used  Substance Use Topics   Alcohol use: No   Drug use: Not Currently    Family Medical History: Family History  Problem Relation Age of Onset   Diabetes Father    Stroke Father    Breast cancer Neg Hx     Physical Examination: There were no vitals filed for this visit.    Awake, alert, oriented to person, place, and time.  Speech is clear and fluent. Fund of knowledge is appropriate.   Cranial Nerves: Pupils equal round and reactive to light.  Facial tone is symmetric.    *** ROM of cervical spine *** pain *** posterior cervical tenderness. *** tenderness in bilateral trapezial region.   *** ROM of lumbar spine ***  pain *** posterior lumbar tenderness.   No abnormal lesions on exposed skin.   Strength: Side Biceps Triceps Deltoid Interossei Grip Wrist Ext. Wrist Flex.  R 5 5 5 5 5 5 5   L 5 5 5 5 5 5 5    Side Iliopsoas Quads Hamstring PF DF EHL  R 5 5 5 5 5 5   L 5 5 5 5 5 5    Reflexes are ***2+ and symmetric at the biceps, brachioradialis, patella and achilles.   Hoffman's is absent.  Clonus is not present.   Bilateral upper and lower extremity sensation is intact to light touch.     Gait is normal.   ***No difficulty with tandem gait.    Medical Decision Making  Imaging: Lumbar xrays dated ***:  ***  Report not yet available.    Thoracic MRI dated 01/18/24:  FINDINGS: Alignment:  No substantial sagittal subluxation.   Vertebrae: Acute/recent or unhealed T12 compression fracture with associated marrow edema and T2 hyperintense cleft along the superior endplate. Associated 25% height loss. Mild (3 mm) bony retropulsion with mild resulting canal stenosis.   Cord:  Normal cord signal.   Paraspinal and other soft tissues: Partially imaged large hiatal hernia.   Disc levels:   Mild canal stenosis at T12 as detailed above. Otherwise, no significant canal or foraminal stenosis.   IMPRESSION: Acute/recent or unhealed T12 compression fracture with associated marrow edema and T2 hyperintense cleft along the superior endplate. Associated 25% height loss and 3 mm bony retropulsion with mild resulting canal stenosis.     Electronically Signed   By: Stevenson Elbe M.D.   On: 01/18/2024 23:27  I have personally reviewed the images and agree with the above interpretation.  Assessment and Plan: Ms. Blazier is a pleasant 83 y.o. female has ***  Treatment options discussed with patient and following plan made:   - Order for physical therapy for *** spine ***. Patient to call to schedule appointment. *** - Continue current medications including ***. Reviewed dosing and side  effects.  - Prescription for ***. Reviewed dosing and side effects. Take with food.  - Prescription for *** to take prn muscle spasms. Reviewed dosing and side effects. Discussed this can cause drowsiness.  - MRI of *** to further evaluate *** radiculopathy. No improvement time or medications (***).  - Referral to PMR at [facility] to discuss possible ***  injections.  - Will schedule phone visit to review MRI results once I get them back.   I spent a total of *** minutes in face-to-face and non-face-to-face activities related to this patient's care today including review of outside records, review of imaging, review of symptoms, physical exam, discussion of differential diagnosis, discussion of treatment options, and documentation.   Thank you for involving me in the care of this patient.   Lucetta Russel PA-C Dept. of Neurosurgery

## 2024-02-06 ENCOUNTER — Ambulatory Visit: Admitting: Orthopedic Surgery

## 2024-02-08 ENCOUNTER — Ambulatory Visit: Admitting: Orthopedic Surgery

## 2024-02-14 ENCOUNTER — Emergency Department

## 2024-02-14 ENCOUNTER — Encounter: Payer: Self-pay | Admitting: Internal Medicine

## 2024-02-14 ENCOUNTER — Other Ambulatory Visit: Payer: Self-pay

## 2024-02-14 ENCOUNTER — Other Ambulatory Visit (HOSPITAL_BASED_OUTPATIENT_CLINIC_OR_DEPARTMENT_OTHER): Payer: Self-pay

## 2024-02-14 ENCOUNTER — Emergency Department
Admission: EM | Admit: 2024-02-14 | Discharge: 2024-02-14 | Disposition: A | Discharge status: Hospice | Attending: Emergency Medicine | Admitting: Emergency Medicine

## 2024-02-14 DIAGNOSIS — I10 Essential (primary) hypertension: Secondary | ICD-10-CM | POA: Insufficient documentation

## 2024-02-14 DIAGNOSIS — M25532 Pain in left wrist: Secondary | ICD-10-CM | POA: Diagnosis present

## 2024-02-14 DIAGNOSIS — W19XXXA Unspecified fall, initial encounter: Secondary | ICD-10-CM | POA: Insufficient documentation

## 2024-02-14 DIAGNOSIS — S52572A Other intraarticular fracture of lower end of left radius, initial encounter for closed fracture: Secondary | ICD-10-CM | POA: Diagnosis not present

## 2024-02-14 DIAGNOSIS — Y9301 Activity, walking, marching and hiking: Secondary | ICD-10-CM | POA: Insufficient documentation

## 2024-02-14 DIAGNOSIS — E119 Type 2 diabetes mellitus without complications: Secondary | ICD-10-CM | POA: Diagnosis not present

## 2024-02-14 DIAGNOSIS — Z8673 Personal history of transient ischemic attack (TIA), and cerebral infarction without residual deficits: Secondary | ICD-10-CM | POA: Diagnosis not present

## 2024-02-14 DIAGNOSIS — Z96641 Presence of right artificial hip joint: Secondary | ICD-10-CM | POA: Insufficient documentation

## 2024-02-14 DIAGNOSIS — Y92008 Other place in unspecified non-institutional (private) residence as the place of occurrence of the external cause: Secondary | ICD-10-CM | POA: Diagnosis not present

## 2024-02-14 DIAGNOSIS — F0150 Vascular dementia without behavioral disturbance: Secondary | ICD-10-CM | POA: Diagnosis not present

## 2024-02-14 DIAGNOSIS — S62102A Fracture of unspecified carpal bone, left wrist, initial encounter for closed fracture: Secondary | ICD-10-CM

## 2024-02-14 DIAGNOSIS — Y92009 Unspecified place in unspecified non-institutional (private) residence as the place of occurrence of the external cause: Secondary | ICD-10-CM

## 2024-02-14 DIAGNOSIS — S52612A Displaced fracture of left ulna styloid process, initial encounter for closed fracture: Secondary | ICD-10-CM | POA: Diagnosis not present

## 2024-02-14 MED ORDER — HYDROCODONE-ACETAMINOPHEN 5-325 MG PO TABS
1.0000 | ORAL_TABLET | Freq: Once | ORAL | Status: AC
  Administered 2024-02-14: 1 via ORAL
  Filled 2024-02-14: qty 1

## 2024-02-14 MED ORDER — LIDOCAINE HCL (PF) 1 % IJ SOLN
5.0000 mL | Freq: Once | INTRAMUSCULAR | Status: AC
  Administered 2024-02-14: 5 mL
  Filled 2024-02-14: qty 5

## 2024-02-14 NOTE — Progress Notes (Signed)
 [facility] Liaison note  This patient is a current followed by [facility]'s palliative care program, Home Heath program (PT) and Home Based Primary Care program.  [facility] will follow through discharge disposition.    Please call with any Palliative care, Home Based Primary Care or home health questions or concerns.    [facility] Liaison [phone]

## 2024-02-14 NOTE — ED Provider Notes (Addendum)
 [facility] Emergency Department Provider Note     Event Date/Time   First MD Initiated Contact with Patient 02/14/24 1630     (approximate)   History   Fall   HPI  Wanda Monroe is a 83 y.o. 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  Patient lives independently, apparently had a mechanical fall last night.  Patient who has home health PT coming in, reported some deformity and disability to the left arm.  They advised patient to report to the ED for further evaluation.  She presents endorsing pain and disability to the left forearm at the wrist.  She denies any head injury or LOC.  No recent illness, fevers, chills, or sweats.  Patient denies any other injury related to the fall.  Physical Exam   Triage Vital Signs: ED Triage Vitals  Encounter Vitals Group     BP 02/14/24 1624 (!) 151/68     Systolic BP Percentile --      Diastolic BP Percentile --      Pulse Rate 02/14/24 1624 (!) 104     Resp 02/14/24 1624 14     Temp --      Temp src --      SpO2 02/14/24 1624 99 %     Weight 02/14/24 1625 127 lb (57.6 kg)     Height 02/14/24 1625 5\' 7"  (1.702 m)     Head Circumference --      Peak Flow --      Pain Score 02/14/24 1625 10     Pain Loc --      Pain Education --      Exclude from Growth Chart --     Most recent vital signs: Vitals:   02/14/24 1624  BP: (!) 151/68  Pulse: (!) 104  Resp: 14  SpO2: 99%    General Awake, no distress. NAD HEENT NCAT. PERRL. EOMI. No rhinorrhea. Mucous membranes are moist.  CV:  Good peripheral perfusion. No CCE distally RESP:  Normal effort.  ABD:  No distention.  MSK:  LUE with obvious deformity to the left wrist.  Patient with some early ecchymosis noted to the lateral ulna.  Normal composite fist  distally. NEURO: Cranial nerves II to XII grossly intact.   ED Results / Procedures / Treatments   Labs (all labs ordered are listed, but only abnormal results are displayed) Labs Reviewed - No data to display   EKG   RADIOLOGY  I personally viewed and evaluated these images as part of my medical decision making, as well as reviewing the written report by the radiologist.  ED Provider Interpretation: Closed, nondisplaced comminuted and impacted fracture of the distal radius.  Oblique fracture of the distal ulna.  DG Hand Complete Left Result Date: 02/14/2024 CLINICAL DATA:  Fourth and fifth finger pain after a fall. Wrist tenderness. EXAM: LEFT HAND - COMPLETE 3+ VIEW COMPARISON:  None Available. FINDINGS: Diffuse distal interphalangeal joint osteoarthritis. Osteopenia. First carpometacarpal and scaphoid trapezium trapezoid joint osteoarthritis. Distal radius fracture extends to the radiocarpal joint. Fracture of the distal ulnar metadiaphysis and ulnar styloid. IMPRESSION: 1. Distal radius and distal ulnar fractures, better evaluated on dedicated wrist series on the same day. 2. No additional evidence of an acute fracture. 3. Osteoarthritis. 4. Osteopenia. Electronically Signed   By: Eldonna Greenspan  Blietz M.D.   On: 02/14/2024 17:37   DG Wrist Complete Left Result Date: 02/14/2024 CLINICAL DATA:  Fall and trauma to the left wrist. EXAM: LEFT WRIST - COMPLETE 3+ VIEW COMPARISON:  None Available. FINDINGS: Mildly angulated intra-articular fracture of the distal radius with dorsal angulation of the distal fracture fragment. There is a minimally displaced fracture of the distal ulnar diaphysis and mildly displaced fracture of the ulnar styloid. No dislocation. The bones are osteopenic. Soft tissue swelling of the wrist. No radiopaque foreign object or soft tissue gas. IMPRESSION: 1. Mildly angulated intra-articular fracture of the distal radius. 2. Minimally displaced fracture of the distal ulnar  diaphysis and mildly displaced fracture of the ulnar styloid. Electronically Signed   By: Angus Bark M.D.   On: 02/14/2024 17:03    PROCEDURES:  Critical Care performed: No  .Reduction of fracture  Date/Time: 02/14/2024 6:04 PM  Performed by: May Sparks, PA-C Authorized by: May Sparks, PA-C  Consent: Verbal consent obtained. Written consent not obtained. Risks and benefits: risks, benefits and alternatives were discussed Consent given by: patient and power of attorney Patient understanding: patient states understanding of the procedure being performed Site marked: the operative site was marked Imaging studies: imaging studies available Required items: required blood products, implants, devices, and special equipment available Patient identity confirmed: verbally with patient Preparation: Patient was prepped and draped in the usual sterile fashion. Local anesthesia used: yes Anesthesia: hematoma block  Anesthesia: Local anesthesia used: yes Local Anesthetic: lidocaine  1% without epinephrine  Anesthetic total: 5 mL  Sedation: Patient sedated: no  Patient tolerance: patient tolerated the procedure well with no immediate complications Comments: Manual reduction of distal radius and ulna fractures of the left forearm performed with reduction in gross deformity.  OCL sugar-tong splint applied by the tech following reduction.    MEDICATIONS ORDERED IN ED: Medications  lidocaine  (PF) (XYLOCAINE ) 1 % injection 5 mL (5 mLs Infiltration Given 02/14/24 1719)  HYDROcodone -acetaminophen  (NORCO/VICODIN) 5-325 MG per tablet 1 tablet (1 tablet Oral Given 02/14/24 1719)     IMPRESSION / MDM / ASSESSMENT AND PLAN / ED COURSE  I reviewed the triage vital signs and the nursing notes.                              Differential diagnosis includes, but is not limited to, wrist fracture, wrist sprain, dislocation, myalgias  Patient's presentation is most consistent  with acute complicated illness / injury requiring diagnostic workup.  ----------------------------------------- 6:08 PM on 02/14/2024 ----------------------------------------- Consulted Dr. Clyda Dark (Ortho) via secure chat.  He is agreeable to the plan for bedside reduction, OCL splint, and outpatient follow-up.  Patient's diagnosis is consistent with mechanical fall resulting in closed left wrist fracture.  Patient presents from home accompanied by POA, for evaluation of left hand pain and disability.  Patient reports a fall last night at home, but denies any head injury, LOC, or other injury reported.  Evaluation today including x-rays, reveals gross deformity and underlying fracture morphology.  My review and interpretation of x-rays, confirms distal radius and ulna fractures.  Patient will be discharged home with instructions to take previously prescribed pain medicines. Patient is to follow up with orthopedics next week as discussed, as needed or otherwise directed. Patient is given ED precautions to return to the ED for any worsening or new symptoms.   FINAL CLINICAL IMPRESSION(S) / ED DIAGNOSES   Final diagnoses:  Fall in home,  initial encounter  Closed fracture of left wrist, initial encounter     Rx / DC Orders   ED Discharge Orders          Ordered    Increase activity slowly        02/14/24 1729    Diet - low sodium heart healthy        02/14/24 1729             Note:  This document was prepared using Dragon voice recognition software and may include unintentional dictation errors.    May Sparks, PA-C 02/14/24 [address], PA-C 02/14/24 Staci Dykes    Bryson Carbine, MD 02/14/24 [phone]

## 2024-02-14 NOTE — Discharge Instructions (Addendum)
 Keep the wrist splint clean, dry, and intact until you follow-up with orthopedics.  Take your home pain medicines as prescribed.  Apply ice over the splint to reduce pain and swelling.  Contact orthopedics for evaluation next week.

## 2024-02-14 NOTE — ED Triage Notes (Signed)
 Pt comes in from home via pov after a fall over night. Pt states that she got up this morning, and was walking down the hall and fell. Pt uses a cane at baseline, but was not using it when she fell.  Pt complains of left wrist pain. Pt's left arm with arm with swelling and slight deformity. No LOC reported, pt not on blood thinners, and pt did not hit her head.  Pt is alert and oriented x4 with no signs of acute distress at this time.

## 2024-02-15 NOTE — TOC Progression Note (Addendum)
 Transition of Care [facility]) - Progression Note    Patient Details  Name: Wanda Monroe MRN: [id number] Date of Birth: 10-31-1940  [facility] CM/SW Contact  Arminda Landmark, RN Phone Number: 02/15/2024, 8:48 AM  Clinical Narrative:    Had a voicemail from [place], [place] in the ED, that this patient was Dc'd from the hospital and needs her hospice care through [facility] reinstated. She was initially Dc'd to rehab but apparently didn't go and went home instead. Called [facility] and left a message for Marylou Sobers to call me back. 0900: Epic chatted with Marylou Sobers and he will follow up with this patient. 8295: Received a call back from her caregiver/POA Toribio Frees. Notified him that hospice will be following up with her today and he verbalized understanding.         Expected Discharge Plan and Services         Expected Discharge Date: 02/14/24                                     Social Determinants of Health (SDOH) Interventions SDOH Screenings   Food Insecurity: Food Insecurity Present (01/19/2024)  Housing: Low Risk  (01/19/2024)  Transportation Needs: No Transportation Needs (01/19/2024)  Utilities: Not At Risk (01/19/2024)  Social Connections: Socially Isolated (01/19/2024)  Tobacco Use: Low Risk  (02/14/2024)    Readmission Risk Interventions     No data to display

## 2024-02-15 NOTE — Progress Notes (Signed)
 [facility] Liaison Note  Received request from [facility], Deeanna Farm, RN,Transitions of Care Manager, for hospice services at home after discharge. Referral received after patient was discharged from the [facility] ED.      Hospice RN to assess for DME needs.    Referral for Hospice follow up submitted today.  Please call with any Hospice related questions or concerns.    Helmut Lobe, [facility] Liaison [phone]

## 2024-03-18 ENCOUNTER — Inpatient Hospital Stay
Admission: EM | Admit: 2024-03-18 | Discharge: 2024-03-25 | DRG: 871 | Disposition: A | Discharge status: Skilled Nursing Facility | Attending: Internal Medicine | Admitting: Internal Medicine

## 2024-03-18 ENCOUNTER — Other Ambulatory Visit: Payer: Self-pay

## 2024-03-18 ENCOUNTER — Emergency Department

## 2024-03-18 DIAGNOSIS — E119 Type 2 diabetes mellitus without complications: Secondary | ICD-10-CM

## 2024-03-18 DIAGNOSIS — D62 Acute posthemorrhagic anemia: Secondary | ICD-10-CM

## 2024-03-18 DIAGNOSIS — J9811 Atelectasis: Secondary | ICD-10-CM | POA: Diagnosis present

## 2024-03-18 DIAGNOSIS — Z9189 Other specified personal risk factors, not elsewhere classified: Secondary | ICD-10-CM | POA: Diagnosis not present

## 2024-03-18 DIAGNOSIS — Z515 Encounter for palliative care: Secondary | ICD-10-CM | POA: Diagnosis not present

## 2024-03-18 DIAGNOSIS — K449 Diaphragmatic hernia without obstruction or gangrene: Secondary | ICD-10-CM | POA: Diagnosis present

## 2024-03-18 DIAGNOSIS — N1831 Chronic kidney disease, stage 3a: Secondary | ICD-10-CM | POA: Diagnosis present

## 2024-03-18 DIAGNOSIS — D72819 Decreased white blood cell count, unspecified: Secondary | ICD-10-CM

## 2024-03-18 DIAGNOSIS — D631 Anemia in chronic kidney disease: Secondary | ICD-10-CM | POA: Diagnosis present

## 2024-03-18 DIAGNOSIS — F32A Depression, unspecified: Secondary | ICD-10-CM | POA: Diagnosis present

## 2024-03-18 DIAGNOSIS — Z1152 Encounter for screening for COVID-19: Secondary | ICD-10-CM | POA: Diagnosis not present

## 2024-03-18 DIAGNOSIS — Z681 Body mass index (BMI) 19 or less, adult: Secondary | ICD-10-CM

## 2024-03-18 DIAGNOSIS — M069 Rheumatoid arthritis, unspecified: Secondary | ICD-10-CM | POA: Diagnosis present

## 2024-03-18 DIAGNOSIS — F0393 Unspecified dementia, unspecified severity, with mood disturbance: Secondary | ICD-10-CM | POA: Diagnosis present

## 2024-03-18 DIAGNOSIS — E785 Hyperlipidemia, unspecified: Secondary | ICD-10-CM | POA: Diagnosis present

## 2024-03-18 DIAGNOSIS — E876 Hypokalemia: Secondary | ICD-10-CM | POA: Diagnosis present

## 2024-03-18 DIAGNOSIS — M810 Age-related osteoporosis without current pathological fracture: Secondary | ICD-10-CM | POA: Diagnosis not present

## 2024-03-18 DIAGNOSIS — I129 Hypertensive chronic kidney disease with stage 1 through stage 4 chronic kidney disease, or unspecified chronic kidney disease: Secondary | ICD-10-CM | POA: Diagnosis present

## 2024-03-18 DIAGNOSIS — F0394 Unspecified dementia, unspecified severity, with anxiety: Secondary | ICD-10-CM | POA: Diagnosis present

## 2024-03-18 DIAGNOSIS — Z79631 Long term (current) use of antimetabolite agent: Secondary | ICD-10-CM

## 2024-03-18 DIAGNOSIS — Z79899 Other long term (current) drug therapy: Secondary | ICD-10-CM

## 2024-03-18 DIAGNOSIS — I700 Atherosclerosis of aorta: Secondary | ICD-10-CM | POA: Diagnosis present

## 2024-03-18 DIAGNOSIS — D75839 Thrombocytosis, unspecified: Secondary | ICD-10-CM | POA: Diagnosis present

## 2024-03-18 DIAGNOSIS — E1122 Type 2 diabetes mellitus with diabetic chronic kidney disease: Secondary | ICD-10-CM | POA: Diagnosis present

## 2024-03-18 DIAGNOSIS — J189 Pneumonia, unspecified organism: Secondary | ICD-10-CM | POA: Diagnosis present

## 2024-03-18 DIAGNOSIS — D519 Vitamin B12 deficiency anemia, unspecified: Secondary | ICD-10-CM | POA: Diagnosis present

## 2024-03-18 DIAGNOSIS — A419 Sepsis, unspecified organism: Secondary | ICD-10-CM | POA: Diagnosis not present

## 2024-03-18 DIAGNOSIS — N179 Acute kidney failure, unspecified: Secondary | ICD-10-CM | POA: Diagnosis present

## 2024-03-18 DIAGNOSIS — Z7982 Long term (current) use of aspirin: Secondary | ICD-10-CM

## 2024-03-18 DIAGNOSIS — R339 Retention of urine, unspecified: Secondary | ICD-10-CM | POA: Diagnosis present

## 2024-03-18 DIAGNOSIS — Z8673 Personal history of transient ischemic attack (TIA), and cerebral infarction without residual deficits: Secondary | ICD-10-CM | POA: Diagnosis not present

## 2024-03-18 DIAGNOSIS — Z9181 History of falling: Secondary | ICD-10-CM

## 2024-03-18 DIAGNOSIS — T380X5A Adverse effect of glucocorticoids and synthetic analogues, initial encounter: Secondary | ICD-10-CM | POA: Diagnosis not present

## 2024-03-18 DIAGNOSIS — K219 Gastro-esophageal reflux disease without esophagitis: Secondary | ICD-10-CM | POA: Diagnosis present

## 2024-03-18 DIAGNOSIS — R4182 Altered mental status, unspecified: Secondary | ICD-10-CM | POA: Diagnosis not present

## 2024-03-18 DIAGNOSIS — R54 Age-related physical debility: Secondary | ICD-10-CM

## 2024-03-18 DIAGNOSIS — G9341 Metabolic encephalopathy: Secondary | ICD-10-CM | POA: Diagnosis present

## 2024-03-18 DIAGNOSIS — I10 Essential (primary) hypertension: Secondary | ICD-10-CM | POA: Diagnosis not present

## 2024-03-18 DIAGNOSIS — J9601 Acute respiratory failure with hypoxia: Secondary | ICD-10-CM | POA: Diagnosis not present

## 2024-03-18 DIAGNOSIS — I959 Hypotension, unspecified: Secondary | ICD-10-CM | POA: Diagnosis present

## 2024-03-18 DIAGNOSIS — D709 Neutropenia, unspecified: Secondary | ICD-10-CM | POA: Diagnosis present

## 2024-03-18 DIAGNOSIS — J690 Pneumonitis due to inhalation of food and vomit: Secondary | ICD-10-CM

## 2024-03-18 DIAGNOSIS — D84821 Immunodeficiency due to drugs: Secondary | ICD-10-CM | POA: Diagnosis present

## 2024-03-18 DIAGNOSIS — J449 Chronic obstructive pulmonary disease, unspecified: Secondary | ICD-10-CM | POA: Diagnosis not present

## 2024-03-18 DIAGNOSIS — E43 Unspecified severe protein-calorie malnutrition: Secondary | ICD-10-CM | POA: Diagnosis present

## 2024-03-18 DIAGNOSIS — J9621 Acute and chronic respiratory failure with hypoxia: Secondary | ICD-10-CM | POA: Diagnosis present

## 2024-03-18 DIAGNOSIS — Z66 Do not resuscitate: Secondary | ICD-10-CM | POA: Diagnosis present

## 2024-03-18 DIAGNOSIS — R5081 Fever presenting with conditions classified elsewhere: Secondary | ICD-10-CM | POA: Diagnosis present

## 2024-03-18 DIAGNOSIS — E1165 Type 2 diabetes mellitus with hyperglycemia: Secondary | ICD-10-CM | POA: Diagnosis present

## 2024-03-18 DIAGNOSIS — J440 Chronic obstructive pulmonary disease with acute lower respiratory infection: Secondary | ICD-10-CM | POA: Diagnosis present

## 2024-03-18 DIAGNOSIS — G8929 Other chronic pain: Secondary | ICD-10-CM | POA: Diagnosis present

## 2024-03-18 DIAGNOSIS — E872 Acidosis, unspecified: Secondary | ICD-10-CM | POA: Diagnosis present

## 2024-03-18 DIAGNOSIS — Z9981 Dependence on supplemental oxygen: Secondary | ICD-10-CM

## 2024-03-18 DIAGNOSIS — R6520 Severe sepsis without septic shock: Secondary | ICD-10-CM | POA: Diagnosis present

## 2024-03-18 DIAGNOSIS — Z789 Other specified health status: Secondary | ICD-10-CM | POA: Diagnosis not present

## 2024-03-18 DIAGNOSIS — Z833 Family history of diabetes mellitus: Secondary | ICD-10-CM

## 2024-03-18 DIAGNOSIS — Z823 Family history of stroke: Secondary | ICD-10-CM

## 2024-03-18 DIAGNOSIS — E46 Unspecified protein-calorie malnutrition: Secondary | ICD-10-CM

## 2024-03-18 DIAGNOSIS — Z90710 Acquired absence of both cervix and uterus: Secondary | ICD-10-CM

## 2024-03-18 DIAGNOSIS — Z7189 Other specified counseling: Secondary | ICD-10-CM | POA: Diagnosis not present

## 2024-03-18 DIAGNOSIS — E1140 Type 2 diabetes mellitus with diabetic neuropathy, unspecified: Secondary | ICD-10-CM | POA: Diagnosis present

## 2024-03-18 DIAGNOSIS — F0390 Unspecified dementia without behavioral disturbance: Secondary | ICD-10-CM

## 2024-03-18 DIAGNOSIS — Z96641 Presence of right artificial hip joint: Secondary | ICD-10-CM | POA: Diagnosis present

## 2024-03-18 DIAGNOSIS — Z7984 Long term (current) use of oral hypoglycemic drugs: Secondary | ICD-10-CM

## 2024-03-18 LAB — CBC WITH DIFFERENTIAL/PLATELET
Abs Immature Granulocytes: 0.01 K/uL (ref 0.00–0.07)
Basophils Absolute: 0 K/uL (ref 0.0–0.1)
Basophils Relative: 0 %
Eosinophils Absolute: 0 K/uL (ref 0.0–0.5)
Eosinophils Relative: 0 %
HCT: 30.2 % — ABNORMAL LOW (ref 36.0–46.0)
Hemoglobin: 9.5 g/dL — ABNORMAL LOW (ref 12.0–15.0)
Immature Granulocytes: 1 %
Lymphocytes Relative: 28 %
Lymphs Abs: 0.3 K/uL — ABNORMAL LOW (ref 0.7–4.0)
MCH: 27.6 pg (ref 26.0–34.0)
MCHC: 31.5 g/dL (ref 30.0–36.0)
MCV: 87.8 fL (ref 80.0–100.0)
Monocytes Absolute: 0.1 K/uL (ref 0.1–1.0)
Monocytes Relative: 14 %
Neutro Abs: 0.6 K/uL — ABNORMAL LOW (ref 1.7–7.7)
Neutrophils Relative %: 57 %
Platelets: 158 K/uL (ref 150–400)
RBC: 3.44 MIL/uL — ABNORMAL LOW (ref 3.87–5.11)
RDW: 19.1 % — ABNORMAL HIGH (ref 11.5–15.5)
Smear Review: NORMAL
WBC: 1 K/uL — CL (ref 4.0–10.5)
nRBC: 2.1 % — ABNORMAL HIGH (ref 0.0–0.2)

## 2024-03-18 LAB — COMPREHENSIVE METABOLIC PANEL WITH GFR
ALT: 10 U/L (ref 0–44)
AST: 23 U/L (ref 15–41)
Albumin: 3.2 g/dL — ABNORMAL LOW (ref 3.5–5.0)
Alkaline Phosphatase: 73 U/L (ref 38–126)
Anion gap: 17 — ABNORMAL HIGH (ref 5–15)
BUN: 23 mg/dL (ref 8–23)
CO2: 29 mmol/L (ref 22–32)
Calcium: 8.8 mg/dL — ABNORMAL LOW (ref 8.9–10.3)
Chloride: 87 mmol/L — ABNORMAL LOW (ref 98–111)
Creatinine, Ser: 1.37 mg/dL — ABNORMAL HIGH (ref 0.44–1.00)
GFR, Estimated: 39 mL/min — ABNORMAL LOW (ref >60)
Glucose, Bld: 143 mg/dL — ABNORMAL HIGH (ref 70–99)
Potassium: 2.9 mmol/L — ABNORMAL LOW (ref 3.5–5.1)
Sodium: 133 mmol/L — ABNORMAL LOW (ref 135–145)
Total Bilirubin: 1 mg/dL (ref 0.0–1.2)
Total Protein: 6.6 g/dL (ref 6.5–8.1)

## 2024-03-18 LAB — LACTIC ACID, PLASMA: Lactic Acid, Venous: 1.8 mmol/L (ref 0.5–1.9)

## 2024-03-18 LAB — BLOOD GAS, VENOUS: Patient temperature: 37

## 2024-03-18 LAB — PROTIME-INR
INR: 1.2 (ref 0.8–1.2)
Prothrombin Time: 15.8 s — ABNORMAL HIGH (ref 11.4–15.2)

## 2024-03-18 LAB — RESP PANEL BY RT-PCR (RSV, FLU A&B, COVID)  RVPGX2
Influenza A by PCR: NEGATIVE
Influenza B by PCR: NEGATIVE
Resp Syncytial Virus by PCR: NEGATIVE
SARS Coronavirus 2 by RT PCR: NEGATIVE

## 2024-03-18 MED ORDER — ACETAMINOPHEN 10 MG/ML IV SOLN
1000.0000 mg | Freq: Once | INTRAVENOUS | Status: AC
  Administered 2024-03-18: 1000 mg via INTRAVENOUS
  Filled 2024-03-18: qty 100

## 2024-03-18 MED ORDER — LACTATED RINGERS IV BOLUS (SEPSIS)
1000.0000 mL | Freq: Once | INTRAVENOUS | Status: AC
  Administered 2024-03-18: 1000 mL via INTRAVENOUS

## 2024-03-18 MED ORDER — LACTATED RINGERS IV BOLUS (SEPSIS)
1000.0000 mL | Freq: Once | INTRAVENOUS | Status: AC
  Administered 2024-03-19: 1000 mL via INTRAVENOUS

## 2024-03-18 MED ORDER — METRONIDAZOLE 500 MG/100ML IV SOLN
500.0000 mg | Freq: Once | INTRAVENOUS | Status: AC
  Administered 2024-03-18: 500 mg via INTRAVENOUS
  Filled 2024-03-18: qty 100

## 2024-03-18 MED ORDER — SODIUM CHLORIDE 0.9 % IV SOLN
2.0000 g | Freq: Once | INTRAVENOUS | Status: AC
  Administered 2024-03-18: 2 g via INTRAVENOUS
  Filled 2024-03-18: qty 12.5

## 2024-03-18 MED ORDER — LACTATED RINGERS IV SOLN: INTRAVENOUS | Status: AC

## 2024-03-18 MED ORDER — VANCOMYCIN HCL IN DEXTROSE 1-5 GM/200ML-% IV SOLN
1000.0000 mg | Freq: Once | INTRAVENOUS | Status: AC
  Administered 2024-03-18: 1000 mg via INTRAVENOUS
  Filled 2024-03-18: qty 200

## 2024-03-18 NOTE — Assessment & Plan Note (Signed)
 Holding immunosuppressive therapy Continue pain management with oxycodone , tizanidine  as needed once alert enough

## 2024-03-18 NOTE — Sepsis Progress Note (Signed)
 Elink following for sepsis protocol.

## 2024-03-18 NOTE — H&P (Signed)
 History and Physical    Patient: Wanda Monroe FMW:[id number] DOB: 12-10-1940 DOA: 03/18/2024 DOS: the patient was seen and examined on 03/18/2024 PCP: [facility]  Patient coming from: SNF  Chief Complaint:  Chief Complaint  Patient presents with   Respiratory Distress    HPI: Wanda Monroe is a 83 y.o. 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  She presented from her facility with respiratory distress, lethargy and confusion from baseline.  She was hypoxic to the 70s with EMS and placed on NRB for transport, transitioning to BiPAP on arrival and subsequently HFNC by the time of admission. Tmax in the ED was 105 and patient was tachycardic to 132 with respiratory rate up to 31.  O2 sats were in the high 90s on BiPAP.  Blood pressure initially 141/58 on arrival trending down to 98/68 by admission. VBG on BiPAP showed pH of 7.55 and pCO2 43 Other labs pertinent for WBC of 1.0 with an ANC of 600, lactic acid 1.8.  WBC was normal at 8 a month ago.  Hemoglobin at baseline at 9.5.  Platelets 158,000 down from 400,000 a month. CMP notable for potassium 2.9, creatinine 1.37 which is around baseline, anion gap 17 normal bicarb, LFTs WNL. Respiratory viral panel negative Urinalysis not yet collected EKG sinus tachycardia at 105 with nonspecific ST-T wave changes Chest x-ray showing streaky atelectasis or minimal infiltrate left base and a moderate hiatal hernia Patient treated with sepsis fluids and started on broad-spectrum antibiotics of vancomycin  and cefepime  and Flagyl  Admission requested     Past Medical History:  Diagnosis Date   Anemia     Arthritis    rhuematoid,OSTEOARTHRITIS   Cellulitis    Diabetes mellitus without complication (HCC)    type II   GERD (gastroesophageal reflux disease)    Gout    Hiatal hernia    Hyperlipidemia    Hypertension    Neuromuscular disorder (HCC)    left foot neuropathy   Osteoporosis    Rosacea    Spinal stenosis of lumbar region    Stroke (HCC)    x2, 9/10   Temporal arteritis (HCC)    Wears dentures    full top, partial bottom   Past Surgical History:  Procedure Laterality Date   ABDOMINAL HYSTERECTOMY     APPENDECTOMY     BACK SURGERY     lumbar and cervical laminectomies   BREAST BIOPSY Left 1960,1980   twice negative results   CAPSULOTOMY METATARSOPHALANGEAL Left 01/22/2015   Procedure: CAPSULOTOMY METATARSOPHALANGEAL;  Surgeon: Donnice Cory, DPM;  Location: [facility];  Service: Podiatry;  Laterality: Left;   CATARACT EXTRACTION W/PHACO Left 10/19/2016   Procedure: CATARACT EXTRACTION PHACO AND INTRAOCULAR LENS PLACEMENT (IOC)  Left Eye  Diabetic;  Surgeon: Dene Etienne, MD;  Location: [facility];  Service: Ophthalmology;  Laterality: Left;  Diabetic - oral meds Left Eye   CATARACT EXTRACTION W/PHACO Right 11/16/2016   Procedure: CATARACT EXTRACTION PHACO AND INTRAOCULAR LENS PLACEMENT (IOC) dIABETIC;  Surgeon: Dene Etienne, MD;  Location: [facility];  Service: Ophthalmology;  Laterality: Right;  DIABETES - oral meds   COLONOSCOPY WITH PROPOFOL  N/A 04/24/2018   Procedure: COLONOSCOPY WITH PROPOFOL ;  Surgeon: Cleaton,  Teodoro K, MD;  Location: [facility];  Service: Gastroenterology;  Laterality: N/A;   ESOPHAGOGASTRODUODENOSCOPY N/A 07/01/2021   Procedure: ESOPHAGOGASTRODUODENOSCOPY (EGD);  Surgeon: Tye Millet, DO;  Location: [facility];  Service: General;  Laterality: N/A;   ESOPHAGOGASTRODUODENOSCOPY (EGD) WITH PROPOFOL  N/A 04/24/2018   Procedure: ESOPHAGOGASTRODUODENOSCOPY (EGD) WITH PROPOFOL ;  Surgeon: Toledo, Ladell POUR, MD;   Location: [facility];  Service: Gastroenterology;  Laterality: N/A;   FOOT SURGERY     HALLUX VALGUS AKIN Left 01/22/2015   Procedure: HALLUX VALGUS AKIN;  Surgeon: Donnice Cory, DPM;  Location: [facility];  Service: Podiatry;  Laterality: Left;  left great toe   HEMORRHOID SURGERY     HIP ARTHROPLASTY Right 05/21/2023   Procedure: ARTHROPLASTY BIPOLAR HIP (HEMIARTHROPLASTY);  Surgeon: Edie Norleen PARAS, MD;  Location: [facility];  Service: Orthopedics;  Laterality: Right;   KNEE ARTHROSCOPY     LUMBAR LAMINECTOMY  04/23/13   NOSE SURGERY     TONSILLECTOMY     URETER SURGERY     Social History:  reports that she has never smoked. She has never used smokeless tobacco. She reports that she does not currently use drugs. She reports that she does not drink alcohol.  Allergies  Allergen Reactions   Codeine Sulfate Nausea Only   Remicade [Infliximab] Other (See Comments)    Infection    Family History  Problem Relation Age of Onset   Diabetes Father    Stroke Father    Breast cancer Neg Hx     Prior to Admission medications   Medication Sig Start Date End Date Taking? Authorizing Provider  acetaminophen  (TYLENOL ) 650 MG CR tablet Take 650 mg by mouth every 8 (eight) hours.    [provider]  amLODipine (NORVASC) 5 MG tablet Take 5 mg by mouth daily. 01/15/24   [provider]  ASPERCREME LIDOCAINE  4 % Place 1 patch onto the skin daily. 01/15/24   [provider]  aspirin  81 MG tablet Take 81 mg by mouth daily. AM    [provider]  atorvastatin  (LIPITOR ) 80 MG tablet Take 80 mg by mouth daily. PM    [provider]  bisacodyl  (DULCOLAX) 10 MG suppository Place 1 suppository (10 mg total) rectally daily as needed for moderate constipation. 05/24/23   Dorinda Drue DASEN, MD  calcium  citrate-vitamin D  (CITRACAL+D) 315-200 MG-UNIT per tablet Take 1 tablet by mouth daily.    [provider]  cholecalciferol  (CHOLECALCIFEROL ) 25 MCG  tablet Take 1 tablet (1,000 Units total) by mouth daily. 04/13/22   Patel, Sona, MD  docusate sodium  (COLACE) 100 MG capsule Take 1 capsule (100 mg total) by mouth 2 (two) times daily. 05/24/23   Dorinda Drue DASEN, MD  DULoxetine  (CYMBALTA ) 30 MG capsule Take 30 mg by mouth daily. 07/07/21   [provider]  ferrous sulfate  325 (65 FE) MG tablet Take 1 tablet (325 mg total) by mouth daily with breakfast. 05/25/23   Dorinda Drue DASEN, MD  folic acid  (FOLVITE ) 1 MG tablet Take 1 mg by mouth daily.    [provider]  gabapentin  (NEURONTIN ) 100 MG capsule Take 200 mg by mouth 2 (two) times daily.    [provider]  hydrochlorothiazide  (HYDRODIURIL ) 25 MG tablet Take 25 mg by mouth daily.    [provider]  HYDROcodone -acetaminophen  (NORCO/VICODIN) 5-325 MG tablet Take 1 tablet by mouth every 6 (six) hours as needed for moderate pain. Patient not taking: Reported on 01/18/2024 05/24/23   Kip Lynwood Double, PA-C  ipratropium-albuterol  (DUONEB) 0.5-2.5 (3) MG/3ML SOLN Take 3 mLs by nebulization every 6 (six) hours as needed (breathing).    [provider]  KLOR-CON  M20 20 MEQ tablet Take 20 mEq by mouth 2 (two) times daily. 01/09/24   [provider]  latanoprost  (XALATAN ) 0.005 % ophthalmic solution Place 1 drop into both eyes at bedtime.    [provider]  LORazepam  (ATIVAN ) 0.5 MG tablet Take 0.5 mg by mouth every 4 (four) hours as needed. 10/30/23   [provider]  metFORMIN (GLUCOPHAGE) 500 MG tablet Take 500 mg by mouth 2 (two) times daily with a meal.    [provider]  methotrexate  (RHEUMATREX) 2.5 MG tablet Take 12.5 mg by mouth once a week. Thursday 01/08/24   [provider]  mirtazapine  (REMERON ) 15 MG tablet Take 15 mg by mouth at bedtime. 08/09/21   [provider]  oxyCODONE  (OXY IR/ROXICODONE ) 5 MG immediate release tablet Take 5 mg by mouth 2 (two) times daily as needed for severe pain or moderate  pain. 05/17/23   [provider]  pantoprazole  (PROTONIX ) 40 MG tablet Take 1 tablet (40 mg total) by mouth daily. 04/12/22   Patel, Sona, MD  sennosides-docusate sodium  (SENOKOT-S) 8.6-50 MG tablet Take 1 tablet by mouth 2 (two) times daily.    [provider]  sertraline  (ZOLOFT ) 50 MG tablet Take 50 mg by mouth at bedtime.    [provider]  spironolactone  (ALDACTONE ) 25 MG tablet Take 25 mg by mouth daily.    [provider]  timolol  (TIMOPTIC ) 0.5 % ophthalmic solution Place 1 drop into both eyes 2 (two) times daily.    [provider]  tiZANidine  (ZANAFLEX ) 4 MG tablet Take 4 mg by mouth 3 (three) times daily as needed for muscle spasms. 06/22/21   [provider]  vitamin B-12 (CYANOCOBALAMIN ) 1000 MCG tablet Take 1,000 mcg by mouth daily.    [provider]    Physical Exam: Vitals:   03/18/24 2315 03/18/24 2330 03/18/24 2345 03/18/24 2356  BP: 98/68 (!) 112/48 (!) 105/43   Pulse: 81 (!) 58    Resp: 20 (!) 21 20   Temp:    100 F (37.8 C)  TempSrc:    Oral  SpO2: 100% 92%     Physical Exam Vitals and nursing note reviewed.  Constitutional:      Comments: Very somnolent, will arouse with shaking but readily fall back asleep with snoring respiration  HENT:     Head: Normocephalic and atraumatic.  Cardiovascular:     Rate and Rhythm: Normal rate and regular rhythm.     Heart sounds: Normal heart sounds.  Pulmonary:     Effort: Tachypnea present.     Comments: Breath sounds decreased at bases Abdominal:     Palpations: Abdomen is soft.     Tenderness: There is no abdominal tenderness.  Neurological:     General: No focal deficit present.     Mental Status: She is lethargic.     Labs on Admission: I have personally reviewed following labs and imaging studies  CBC: Recent Labs  Lab 03/18/24 2146  WBC 1.0*  NEUTROABS 0.6*  HGB 9.5*  HCT 30.2*  MCV 87.8  PLT 158   Basic Metabolic Panel: Recent Labs   Lab 03/18/24 2146  NA 133*  K 2.9*  CL 87*  CO2 29  GLUCOSE 143*  BUN 23  CREATININE 1.37*  CALCIUM  8.8*   GFR: CrCl cannot be calculated (  Unknown ideal weight.). Liver Function Tests: Recent Labs  Lab 03/18/24 2146  AST 23  ALT 10  ALKPHOS 73  BILITOT 1.0  PROT 6.6  ALBUMIN 3.2*   No results for input(s): LIPASE, AMYLASE in the last 168 hours. No results for input(s): AMMONIA in the last 168 hours. Coagulation Profile: Recent Labs  Lab 03/18/24 2146  INR 1.2   Cardiac Enzymes: No results for input(s): CKTOTAL, CKMB, CKMBINDEX, TROPONINI in the last 168 hours. BNP (last 3 results) No results for input(s): PROBNP in the last 8760 hours. HbA1C: No results for input(s): HGBA1C in the last 72 hours. CBG: No results for input(s): GLUCAP in the last 168 hours. Lipid Profile: No results for input(s): CHOL, HDL, LDLCALC, TRIG, CHOLHDL, LDLDIRECT in the last 72 hours. Thyroid  Function Tests: No results for input(s): TSH, T4TOTAL, FREET4, T3FREE, THYROIDAB in the last 72 hours. Anemia Panel: No results for input(s): VITAMINB12, FOLATE, FERRITIN, TIBC, IRON , RETICCTPCT in the last 72 hours. Urine analysis:    Component Value Date/Time   COLORURINE YELLOW (A) 05/20/2023 2137   APPEARANCEUR CLEAR (A) 05/20/2023 2137   LABSPEC 1.023 05/20/2023 2137   PHURINE 5.0 05/20/2023 2137   GLUCOSEU NEGATIVE 05/20/2023 2137   HGBUR NEGATIVE 05/20/2023 2137   BILIRUBINUR NEGATIVE 05/20/2023 2137   KETONESUR NEGATIVE 05/20/2023 2137   PROTEINUR NEGATIVE 05/20/2023 2137   NITRITE NEGATIVE 05/20/2023 2137   LEUKOCYTESUR NEGATIVE 05/20/2023 2137    Radiological Exams on Admission: DG Chest Port 1 View Result Date: 03/18/2024 CLINICAL DATA:  Possible sepsis respiratory distress EXAM: PORTABLE CHEST 1 VIEW COMPARISON:  01/18/2024 FINDINGS: Streaky atelectasis or minimal infiltrate left base. No pleural effusion. Stable  cardiomediastinal silhouette. Moderate hiatal hernia. Aortic atherosclerosis. IMPRESSION: Streaky atelectasis or minimal infiltrate at the left base. Moderate hiatal hernia. Electronically Signed   By: Luke Bun M.D.   On: 03/18/2024 22:07   Data Reviewed for HPI: Relevant notes from primary care and specialist visits, past discharge summaries as available in EHR, including Care Everywhere. Prior diagnostic testing as pertinent to current admission diagnoses Updated medications and problem lists for reconciliation ED course, including vitals, labs, imaging, treatment and response to treatment Triage notes, nursing and pharmacy notes and ED provider's notes Notable results as noted above in HPI      Assessment and Plan: * Sepsis due to pneumonia (HCC) Neutropenia with fever At risk for severe infection due to chronic immunosuppressive therapy Sepsis criteria include fever, tachycardia, tachypnea, hypotension, AMS Neutropenia possibly from marrow suppression from severe infection Follow expanded respiratory viral panel (negative for COVID flu and RSV so far) Aggressive IV fluid resuscitation Broad-spectrum antibiotics of cefepime  vancomycin  and Flagyl  Close monitoring in progressive care unit Neutropenic precautions Patient is at high risk of clinical decompensation in view of baseline frailty  Acute on chronic respiratory failure with hypoxia (HCC) COPD Patient presented with acute respiratory distress, hypoxic to the 70s with EMS, NRB-> BiPAP-> HFNC Secondary to suspected pneumonia No overt wheezing so exacerbation of COPD not suspected Treat pneumonia as outlined above under  sepsis Continue HFNC and wean as tolerated Aspiration precautions  Hypokalemia IV repletion and monitor  Acute metabolic encephalopathy Dementia without behavioral disturbance Depression Patient presented with increased confusion from baseline for dementia, likely related to acute  infection Neurologic checks with fall and aspiration precautions Continue home mirtazapine , sertraline  with Ativan  as needed   History of hemorrhagic CVA (cerebrovascular accident) No acute issues suspected Head CT not done  Chronic kidney disease, stage 3a (HCC) Anion  gap metabolic acidosis Expecting improvement with hydration  HTN (hypertension) Hold antihypertensives in the setting of severe sepsis  Chronic obstructive pulmonary disease (HCC) Not acutely exacerbated DuoNebs as needed  Diabetes mellitus without complication (HCC) Sliding scale insulin  coverage  Frailty History of dementia, recent fall in May 2025 with multiple fractures Previously on hospice On chronic immunosuppressive therapy now with severe of infection High risk of mortality in the next 6 months  Rheumatoid arthritis (HCC) Holding immunosuppressive therapy Continue pain management with oxycodone , tizanidine  as needed once alert enough    DVT prophylaxis: Lovenox   Consults: none  Advance Care Planning:   Code Status: Limited: Do not attempt resuscitation (DNR) -DNR-LIMITED -Do Not Intubate/DNI    Family Communication: none  Disposition Plan: Back to previous home environment  Severity of Illness: The appropriate patient status for this patient is INPATIENT. Inpatient status is judged to be reasonable and necessary in order to provide the required intensity of service to ensure the patient's safety. The patient's presenting symptoms, physical exam findings, and initial radiographic and laboratory data in the context of their chronic comorbidities is felt to place them at high risk for further clinical deterioration. Furthermore, it is not anticipated that the patient will be medically stable for discharge from the hospital within 2 midnights of admission.   * I certify that at the point of admission it is my clinical judgment that the patient will require inpatient hospital care spanning beyond 2  midnights from the point of admission due to high intensity of service, high risk for further deterioration and high frequency of surveillance required.*  Author: Delayne LULLA Solian, MD 03/18/2024 11:58 PM  For on call review [email or web address].

## 2024-03-18 NOTE — Progress Notes (Signed)
 CODE SEPSIS - PHARMACY COMMUNICATION  **Broad Spectrum Antibiotics should be administered within 1 hour of Sepsis diagnosis**  Time Code Sepsis Called/Page Received: 2140  Antibiotics Ordered: Cefepime , Flagyl , Vancomycin   Time of 1st antibiotic administration: 2207  Rankin CANDIE Dills, PharmD, [facility] 03/18/2024 10:42 PM

## 2024-03-18 NOTE — Assessment & Plan Note (Signed)
 COPD Patient presented with acute respiratory distress, hypoxic to the 53s with EMS, NRB-> BiPAP-> HFNC Secondary to suspected pneumonia No overt wheezing so exacerbation of COPD not suspected Treat pneumonia as outlined above under  sepsis Continue HFNC and wean as tolerated Aspiration precautions

## 2024-03-18 NOTE — Assessment & Plan Note (Signed)
Not acutely exacerbated DuoNebs as needed

## 2024-03-18 NOTE — ED Triage Notes (Signed)
 Pt to ED via [facility] c/o respiratory distress and AMS. Pt had recent UTI and finished antibiotics. On EMS arrival pt was A&Ox2, pt usually a&ox4, pt febrile at 102.1 axillary. Was found to be 78% RA was placed on 15L NRB and sats went up to 98%. Pt has hx of HTN and diabetes

## 2024-03-18 NOTE — Assessment & Plan Note (Addendum)
 Neutropenia with fever At risk for severe infection due to chronic immunosuppressive therapy Sepsis criteria include fever, tachycardia, tachypnea, hypotension, AMS Neutropenia possibly from marrow suppression from severe infection Follow expanded respiratory viral panel (negative for COVID flu and RSV so far) Aggressive IV fluid resuscitation Broad-spectrum antibiotics of cefepime  vancomycin  and Flagyl  Close monitoring in progressive care unit Neutropenic precautions Patient is at high risk of clinical decompensation in view of baseline frailty

## 2024-03-18 NOTE — Assessment & Plan Note (Signed)
 Anion gap metabolic acidosis Expecting improvement with hydration

## 2024-03-18 NOTE — Assessment & Plan Note (Addendum)
 Dementia without behavioral disturbance Depression Patient presented with increased confusion from baseline for dementia, likely related to acute infection Neurologic checks with fall and aspiration precautions Will keep npo tonight until more awake and alert Continue home mirtazapine , sertraline  with Ativan  as needed

## 2024-03-18 NOTE — Assessment & Plan Note (Signed)
 No acute issues suspected Head CT not done

## 2024-03-18 NOTE — Assessment & Plan Note (Signed)
 History of dementia, recent fall in May 2025 with multiple fractures Previously on hospice On chronic immunosuppressive therapy now with severe of infection High risk of mortality in the next 6 months

## 2024-03-18 NOTE — Assessment & Plan Note (Signed)
 Sliding scale insulin coverage

## 2024-03-19 DIAGNOSIS — J189 Pneumonia, unspecified organism: Secondary | ICD-10-CM

## 2024-03-19 DIAGNOSIS — A419 Sepsis, unspecified organism: Secondary | ICD-10-CM | POA: Diagnosis not present

## 2024-03-19 DIAGNOSIS — E876 Hypokalemia: Secondary | ICD-10-CM

## 2024-03-19 LAB — CBG MONITORING, ED
Glucose-Capillary: 172 mg/dL — ABNORMAL HIGH (ref 70–99)
Glucose-Capillary: 92 mg/dL (ref 70–99)
Glucose-Capillary: 116 mg/dL — ABNORMAL HIGH (ref 70–99)
Glucose-Capillary: 164 mg/dL — ABNORMAL HIGH (ref 70–99)

## 2024-03-19 LAB — GLUCOSE, CAPILLARY
Glucose-Capillary: 116 mg/dL — ABNORMAL HIGH (ref 70–99)
Glucose-Capillary: 153 mg/dL — ABNORMAL HIGH (ref 70–99)

## 2024-03-19 LAB — COMPREHENSIVE METABOLIC PANEL WITH GFR
ALT: 6 U/L (ref 0–44)
AST: 29 U/L (ref 15–41)
Albumin: 2.4 g/dL — ABNORMAL LOW (ref 3.5–5.0)
Alkaline Phosphatase: 49 U/L (ref 38–126)
Anion gap: 11 (ref 5–15)
BUN: 19 mg/dL (ref 8–23)
CO2: 31 mmol/L (ref 22–32)
Calcium: 7.9 mg/dL — ABNORMAL LOW (ref 8.9–10.3)
Chloride: 91 mmol/L — ABNORMAL LOW (ref 98–111)
Creatinine, Ser: 1.05 mg/dL — ABNORMAL HIGH (ref 0.44–1.00)
GFR, Estimated: 53 mL/min — ABNORMAL LOW (ref >60)
Glucose, Bld: 91 mg/dL (ref 70–99)
Potassium: 2.7 mmol/L — CL (ref 3.5–5.1)
Sodium: 133 mmol/L — ABNORMAL LOW (ref 135–145)
Total Bilirubin: 0.7 mg/dL (ref 0.0–1.2)
Total Protein: 4.9 g/dL — ABNORMAL LOW (ref 6.5–8.1)

## 2024-03-19 LAB — RESPIRATORY PANEL BY PCR

## 2024-03-19 LAB — URINALYSIS, W/ REFLEX TO CULTURE (INFECTION SUSPECTED)
Bilirubin Urine: NEGATIVE
Glucose, UA: NEGATIVE mg/dL
Hgb urine dipstick: NEGATIVE
Ketones, ur: NEGATIVE mg/dL
Leukocytes,Ua: NEGATIVE
Nitrite: NEGATIVE
Protein, ur: NEGATIVE mg/dL
Specific Gravity, Urine: 1.005 (ref 1.005–1.030)
pH: 5 (ref 5.0–8.0)

## 2024-03-19 LAB — PHOSPHORUS: Phosphorus: 2.9 mg/dL (ref 2.5–4.6)

## 2024-03-19 LAB — PROCALCITONIN: Procalcitonin: 5.64 ng/mL

## 2024-03-19 LAB — MAGNESIUM: Magnesium: 1.5 mg/dL — ABNORMAL LOW (ref 1.7–2.4)

## 2024-03-19 LAB — POTASSIUM: Potassium: 3.8 mmol/L (ref 3.5–5.1)

## 2024-03-19 LAB — LACTIC ACID, PLASMA: Lactic Acid, Venous: 0.9 mmol/L (ref 0.5–1.9)

## 2024-03-19 MED ORDER — SODIUM CHLORIDE 0.9 % IV SOLN: 2.0000 g | INTRAVENOUS | Status: DC

## 2024-03-19 MED ORDER — MIDODRINE HCL 5 MG PO TABS
10.0000 mg | ORAL_TABLET | Freq: Three times a day (TID) | ORAL | Status: DC
  Administered 2024-03-19 – 2024-03-24 (×16): 10 mg via ORAL
  Filled 2024-03-19 (×17): qty 2

## 2024-03-19 MED ORDER — ENOXAPARIN SODIUM 40 MG/0.4ML IJ SOSY: 40.0000 mg | PREFILLED_SYRINGE | INTRAMUSCULAR | Status: DC

## 2024-03-19 MED ORDER — LORAZEPAM 0.5 MG PO TABS
0.5000 mg | ORAL_TABLET | ORAL | Status: DC | PRN
  Administered 2024-03-20: 0.5 mg via ORAL
  Filled 2024-03-19: qty 1

## 2024-03-19 MED ORDER — MAGIC MOUTHWASH W/LIDOCAINE
5.0000 mL | Freq: Four times a day (QID) | ORAL | Status: DC
  Filled 2024-03-19 (×2): qty 5

## 2024-03-19 MED ORDER — ENOXAPARIN SODIUM 30 MG/0.3ML IJ SOSY
30.0000 mg | PREFILLED_SYRINGE | INTRAMUSCULAR | Status: DC
  Administered 2024-03-19 – 2024-03-23 (×5): 30 mg via SUBCUTANEOUS
  Filled 2024-03-19 (×5): qty 0.3

## 2024-03-19 MED ORDER — HYDROCORTISONE SOD SUC (PF) 100 MG IJ SOLR
100.0000 mg | Freq: Three times a day (TID) | INTRAMUSCULAR | Status: DC
  Administered 2024-03-19 – 2024-03-20 (×5): 100 mg via INTRAVENOUS
  Filled 2024-03-19 (×6): qty 2

## 2024-03-19 MED ORDER — POTASSIUM CHLORIDE 10 MEQ/100ML IV SOLN
10.0000 meq | INTRAVENOUS | Status: AC
  Administered 2024-03-19 (×2): 10 meq via INTRAVENOUS
  Filled 2024-03-19: qty 100

## 2024-03-19 MED ORDER — ACETAMINOPHEN 650 MG RE SUPP: 650.0000 mg | Freq: Four times a day (QID) | RECTAL | Status: DC | PRN

## 2024-03-19 MED ORDER — SODIUM CHLORIDE 0.9 % IV SOLN
1.0000 g | INTRAVENOUS | Status: DC
  Administered 2024-03-19 – 2024-03-20 (×2): 1 g via INTRAVENOUS
  Filled 2024-03-19 (×2): qty 10

## 2024-03-19 MED ORDER — ONDANSETRON HCL 4 MG PO TABS
4.0000 mg | ORAL_TABLET | Freq: Four times a day (QID) | ORAL | Status: DC | PRN
Start: 2024-03-19 — End: 2024-03-25

## 2024-03-19 MED ORDER — INSULIN ASPART 100 UNIT/ML IJ SOLN
0.0000 [IU] | INTRAMUSCULAR | Status: DC
  Administered 2024-03-19 (×3): 2 [IU] via SUBCUTANEOUS
  Administered 2024-03-20: 3 [IU] via SUBCUTANEOUS
  Administered 2024-03-20: 1 [IU] via SUBCUTANEOUS
  Administered 2024-03-21: 2 [IU] via SUBCUTANEOUS
  Administered 2024-03-21: 3 [IU] via SUBCUTANEOUS
  Administered 2024-03-21: 2 [IU] via SUBCUTANEOUS
  Administered 2024-03-21: 3 [IU] via SUBCUTANEOUS
  Administered 2024-03-22: 2 [IU] via SUBCUTANEOUS
  Administered 2024-03-22: 9 [IU] via SUBCUTANEOUS
  Administered 2024-03-22: 1 [IU] via SUBCUTANEOUS
  Administered 2024-03-22 – 2024-03-23 (×2): 5 [IU] via SUBCUTANEOUS
  Administered 2024-03-23 (×2): 2 [IU] via SUBCUTANEOUS
  Administered 2024-03-23: 1 [IU] via SUBCUTANEOUS
  Administered 2024-03-23: 5 [IU] via SUBCUTANEOUS
  Administered 2024-03-23 – 2024-03-24 (×2): 2 [IU] via SUBCUTANEOUS
  Administered 2024-03-24: 3 [IU] via SUBCUTANEOUS
  Administered 2024-03-24: 2 [IU] via SUBCUTANEOUS
  Administered 2024-03-25: 3 [IU] via SUBCUTANEOUS
  Filled 2024-03-19 (×23): qty 1

## 2024-03-19 MED ORDER — SODIUM CHLORIDE 0.9 % IV BOLUS
1000.0000 mL | Freq: Once | INTRAVENOUS | Status: AC
  Administered 2024-03-19: 1000 mL via INTRAVENOUS

## 2024-03-19 MED ORDER — IPRATROPIUM-ALBUTEROL 0.5-2.5 (3) MG/3ML IN SOLN
3.0000 mL | Freq: Four times a day (QID) | RESPIRATORY_TRACT | Status: DC | PRN
  Administered 2024-03-20: 3 mL via RESPIRATORY_TRACT
  Filled 2024-03-19: qty 3

## 2024-03-19 MED ORDER — VANCOMYCIN HCL 500 MG/100ML IV SOLN
500.0000 mg | Freq: Once | INTRAVENOUS | Status: DC
  Filled 2024-03-19: qty 100

## 2024-03-19 MED ORDER — POTASSIUM CHLORIDE 10 MEQ/100ML IV SOLN
10.0000 meq | INTRAVENOUS | Status: AC
  Administered 2024-03-19 (×6): 10 meq via INTRAVENOUS
  Filled 2024-03-19 (×6): qty 100

## 2024-03-19 MED ORDER — MAGNESIUM SULFATE 2 GM/50ML IV SOLN
2.0000 g | Freq: Once | INTRAVENOUS | Status: AC
  Administered 2024-03-19: 2 g via INTRAVENOUS
  Filled 2024-03-19: qty 50

## 2024-03-19 MED ORDER — LACTATED RINGERS IV SOLN
150.0000 mL/h | INTRAVENOUS | Status: DC
Start: 2024-03-19 — End: 2024-03-19

## 2024-03-19 MED ORDER — ONDANSETRON HCL 4 MG/2ML IJ SOLN
4.0000 mg | Freq: Four times a day (QID) | INTRAMUSCULAR | Status: DC | PRN
  Administered 2024-03-20: 4 mg via INTRAVENOUS
  Filled 2024-03-19: qty 2

## 2024-03-19 MED ORDER — VANCOMYCIN HCL 750 MG/150ML IV SOLN: 750.0000 mg | INTRAVENOUS | Status: DC

## 2024-03-19 MED ORDER — VANCOMYCIN HCL 1250 MG/250ML IV SOLN: 1250.0000 mg | INTRAVENOUS | Status: DC

## 2024-03-19 MED ORDER — ATORVASTATIN CALCIUM 80 MG PO TABS
80.0000 mg | ORAL_TABLET | Freq: Every day | ORAL | Status: DC
  Administered 2024-03-19 – 2024-03-24 (×6): 80 mg via ORAL
  Filled 2024-03-19 (×2): qty 1
  Filled 2024-03-19: qty 4
  Filled 2024-03-19 (×3): qty 1

## 2024-03-19 MED ORDER — ACETAMINOPHEN 325 MG PO TABS
650.0000 mg | ORAL_TABLET | Freq: Four times a day (QID) | ORAL | Status: DC | PRN
  Administered 2024-03-19: 650 mg via ORAL
  Filled 2024-03-19: qty 2

## 2024-03-19 MED ORDER — AZITHROMYCIN 500 MG PO TABS
500.0000 mg | ORAL_TABLET | Freq: Every day | ORAL | Status: DC
  Administered 2024-03-19 – 2024-03-25 (×7): 500 mg via ORAL
  Filled 2024-03-19 (×7): qty 1

## 2024-03-19 MED ORDER — METRONIDAZOLE 500 MG/100ML IV SOLN
500.0000 mg | Freq: Two times a day (BID) | INTRAVENOUS | Status: DC
  Administered 2024-03-19: 500 mg via INTRAVENOUS
  Filled 2024-03-19: qty 100

## 2024-03-19 MED ORDER — VANCOMYCIN HCL IN DEXTROSE 1-5 GM/200ML-% IV SOLN: 1000.0000 mg | INTRAVENOUS | Status: DC

## 2024-03-19 NOTE — Evaluation (Signed)
 Clinical/Bedside Swallow Evaluation Patient Details  Name: Wanda Monroe MRN: [id number] Date of Birth: 1941/08/31  Today's Date: 03/19/2024 Time: SLP Start Time (ACUTE ONLY): 1515 SLP Stop Time (ACUTE ONLY): 1615 SLP Time Calculation (min) (ACUTE ONLY): 60 min  Past Medical History:  Past Medical History:  Diagnosis Date   Anemia    Arthritis    rhuematoid,OSTEOARTHRITIS   Cellulitis    Diabetes mellitus without complication (HCC)    type II   GERD (gastroesophageal reflux disease)    Gout    Hiatal hernia    Hyperlipidemia    Hypertension    Neuromuscular disorder (HCC)    left foot neuropathy   Osteoporosis    Rosacea    Spinal stenosis of lumbar region    Stroke (HCC)    x2, 9/10   Temporal arteritis (HCC)    Wears dentures    full top, partial bottom   Past Surgical History:  Past Surgical History:  Procedure Laterality Date   ABDOMINAL HYSTERECTOMY     APPENDECTOMY     BACK SURGERY     lumbar and cervical laminectomies   BREAST BIOPSY Left 1960,1980   twice negative results   CAPSULOTOMY METATARSOPHALANGEAL Left 01/22/2015   Procedure: CAPSULOTOMY METATARSOPHALANGEAL;  Surgeon: Donnice Cory, DPM;  Location: [facility];  Service: Podiatry;  Laterality: Left;   CATARACT EXTRACTION W/PHACO Left 10/19/2016   Procedure: CATARACT EXTRACTION PHACO AND INTRAOCULAR LENS PLACEMENT (IOC)  Left Eye  Diabetic;  Surgeon: Dene Etienne, MD;  Location: [facility];  Service: Ophthalmology;  Laterality: Left;  Diabetic - oral meds Left Eye   CATARACT EXTRACTION W/PHACO Right 11/16/2016   Procedure: CATARACT EXTRACTION PHACO AND INTRAOCULAR LENS PLACEMENT (IOC) dIABETIC;  Surgeon: Dene Etienne, MD;  Location: [facility];  Service: Ophthalmology;  Laterality: Right;  DIABETES - oral meds   COLONOSCOPY WITH PROPOFOL  N/A 04/24/2018   Procedure: COLONOSCOPY WITH PROPOFOL ;  Surgeon: Toledo, Ladell POUR, MD;  Location: [facility] ENDOSCOPY;  Service:  Gastroenterology;  Laterality: N/A;   ESOPHAGOGASTRODUODENOSCOPY N/A 07/01/2021   Procedure: ESOPHAGOGASTRODUODENOSCOPY (EGD);  Surgeon: Tye Millet, DO;  Location: [facility] ENDOSCOPY;  Service: General;  Laterality: N/A;   ESOPHAGOGASTRODUODENOSCOPY (EGD) WITH PROPOFOL  N/A 04/24/2018   Procedure: ESOPHAGOGASTRODUODENOSCOPY (EGD) WITH PROPOFOL ;  Surgeon: Toledo, Ladell POUR, MD;  Location: [facility] ENDOSCOPY;  Service: Gastroenterology;  Laterality: N/A;   FOOT SURGERY     HALLUX VALGUS AKIN Left 01/22/2015   Procedure: HALLUX VALGUS AKIN;  Surgeon: Donnice Cory, DPM;  Location: [facility];  Service: Podiatry;  Laterality: Left;  left great toe   HEMORRHOID SURGERY     HIP ARTHROPLASTY Right 05/21/2023   Procedure: ARTHROPLASTY BIPOLAR HIP (HEMIARTHROPLASTY);  Surgeon: Edie Norleen PARAS, MD;  Location: [facility] ORS;  Service: Orthopedics;  Laterality: Right;   KNEE ARTHROSCOPY     LUMBAR LAMINECTOMY  04/23/13   NOSE SURGERY     TONSILLECTOMY     URETER SURGERY     HPI:  Pt is a 83 y.o. 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  She presented from her facility with respiratory distress, lethargy and confusion from baseline.  She was hypoxic to the 70s with EMS and placed on NRB  for transport, transitioning to BiPAP on arrival and subsequently HFNC briefly by the time of admission.  Chest x-ray showing streaky atelectasis or minimal infiltrate left base and a moderate hiatal hernia  Patient treated with sepsis fluids and started on broad-spectrum antibiotics of vancomycin  and cefepime  and Flagyl .  Per MD note, pt is Neutropenia with fever; at risk for severe  infection due to chronic immunosuppressive therapy.  Previous CT of ABD: Large hiatal hernia.    Assessment / Plan / Recommendation  Clinical Impression   Pt seen for BSE. Pt awake, verbal but w/ confusion noted in some responses- she tended to repeat herself often. Min labile asking why is God still keeping me here? I am ready to go.. She followed general instructions appropriately given cue.  On RA, afebrile now. WBC 1.0.  Pt appears to present w/ grossly functional oropharyngeal phase swallowing w/ No overt oropharyngeal phase dysphagia noted, No neuromuscular deficits noted. Pt consumed po trials w/ No overt, clinical s/s of aspiration during po trials. Pt appears at reduced risk for aspiration when following general aspiration precautions w/ easy to drink/eat foods.  However, pt does have challenging factors that could impact her oropharyngeal swallowing to include Pain/discomfort(NSG aware) of her Mouth- oral erythema impacting her desire/attention for oral intake, fatigue/weakness, FTT presentation, and Cognitive decline/Dementia. These factors can increase risk for dysphagia as well as decreased oral intake overall.   During few po trials accepted, pt consumed the consistencies w/ no overt coughing, decline in vocal quality, or change in respiratory presentation during/post trials. O2 sats 98%. Oral phase appeared grossly [facility] w/ timely bolus management and control of bolus propulsion for A-P transfer for swallowing. Oral clearing achieved w/ all trial consistencies. Pt indicated min+ oral discomfort when drink/food touched a sore spot in her mouth(this passed post oral clearing).  OM Exam appeared [facility] w/ no unilateral weakness noted. Speech Clear. Pt fed self w/ setup support.   Recommend a FULL liquid diet consistency w/ well-moistened foods and foods of preference; Thin liquids -- carefully monitor straw use, and pt should help to Hold Cup when drinking. Recommend general aspiration  precautions to include Small sips/bites Slowly and sitting fully upright for oral intake. REFLUX/GERD precautions. Supervision and support at all meals d/t the Cognitive decline. Pills CRUSHED in Puree for safer, easier swallowing -- it is encouraged now and for D/C to the MD.  Consulted MD re: an oral rinse for treatment of erythema and slight ulcerated spot(s).   Education given on Pills in Puree; food consistencies and easy to eat options; general aspiration and Reflux precautions to pt and NSG. ST services to monitor during admit for further needs and to hopefully upgrade to soft foods easy to eat. MD/NSG updated. Recommend Dietician and Chaplain f/u for support; ordered. Precautions posted in room, chart.  SLP Visit Diagnosis: Dysphagia, unspecified (R13.10) (oral erythema; FTT presentation; Dementia)    Aspiration Risk  Mild aspiration risk;Risk for inadequate nutrition/hydration (reduced following general precs.)    Diet Recommendation   Thin;Dysphagia 1 (puree) (more of a Full Liquid diet for now) = a FULL liquid diet consistency w/ well-moistened foods and foods of preference; Thin liquids -- carefully monitor straw use, and pt should help to Hold Cup when drinking. Recommend general aspiration precautions to include Small sips/bites Slowly and sitting fully upright for oral intake. REFLUX/GERD precautions. Supervision and support at all meals d/t the Cognitive decline.   Medication Administration: Crushed with puree    Other  Recommendations Recommended  Consults:  (Dietician; Chaplain; has Hospice baseline) Oral Care Recommendations: Oral care BID;Staff/trained caregiver to provide oral care ([employer] care)     Assistance Recommended at Discharge  FULL  Functional Status Assessment Patient has had a recent decline in their functional status and demonstrates the ability to make significant improvements in function in a reasonable and predictable amount of time.  Frequency and Duration min  1 x/week  1 week       Prognosis Prognosis for improved oropharyngeal function: Fair Barriers to Reach Goals: Cognitive deficits;Time post onset;Severity of deficits;Motivation Barriers/Prognosis Comment: oral erythema; FTT presentation; Dementia      Swallow Study   General Date of Onset: 03/18/24 HPI: Pt is a 83 y.o. 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  She presented from her facility with respiratory distress, lethargy and confusion from baseline.  She was hypoxic to the 70s with EMS and placed on NRB for transport, transitioning to BiPAP on arrival and subsequently HFNC briefly by the time of admission.  Chest x-ray showing streaky atelectasis or minimal infiltrate left base and a moderate hiatal hernia  Patient treated with sepsis fluids and started on broad-spectrum antibiotics of vancomycin  and cefepime  and Flagyl .  Per MD note, pt is Neutropenia with fever; at risk for severe infection due to chronic immunosuppressive therapy.  Previous CT of ABD: Large hiatal hernia. Type of Study: Bedside Swallow Evaluation Previous Swallow Assessment: 04/2022 Diet Prior to this Study: Thin liquids (Level 0);Clear liquid diet (rec'd prior: mech soft w/ thins) Temperature Spikes Noted: No (wbc 1.0) Respiratory Status: Room air History of Recent Intubation: No Behavior/Cognition: Alert;Cooperative;Pleasant mood Oral Cavity Assessment: Erythema;Dry (slight ulcerated place behind lower lip) Oral Care Completed by SLP: Yes Oral Cavity - Dentition: Dentures, top (front lower teeth only) Vision: Functional for self-feeding Self-Feeding Abilities: Able to  feed self;Needs assist;Needs set up Patient Positioning: Upright in bed (supported) Baseline Vocal Quality: Normal Volitional Cough: Strong Volitional Swallow: Able to elicit    Oral/Motor/Sensory Function Overall Oral Motor/Sensory Function: Within functional limits   Ice Chips Ice chips: Within functional limits Presentation: Spoon (fed; 3 trials)   Thin Liquid Thin Liquid: Within functional limits Presentation: Straw (supported; 5 trials)    Nectar Thick Nectar Thick Liquid: Not tested   Honey Thick Honey Thick Liquid: Not tested   Puree Puree: Within functional limits Presentation: Spoon (fed; 3 trials)   Solid     Solid: Not tested Other Comments: declined        Comer Portugal, MS, CCC-SLP Speech Language Pathologist Rehab Services; [facility] [phone] (ascom) Amandine Covino 03/19/2024,4:38 PM

## 2024-03-19 NOTE — Assessment & Plan Note (Signed)
IV repletion and monitor 

## 2024-03-19 NOTE — Hospital Course (Signed)
 Wanda Monroe

## 2024-03-19 NOTE — ED Notes (Addendum)
 Pt's blood pressures have been soft with SBP ranging between 70s to 90s and DBP ranging from 30s to 40s. Delayne Solian, MD notified. Pt to receive 1 L fluid bolus and Solu-Cortef  as ordered. MD stated she notified pt's POA of pt condition.

## 2024-03-19 NOTE — ED Notes (Signed)
 Pt moved to cpod 32.  Report off to savannah rn

## 2024-03-19 NOTE — Consult Note (Signed)
 PHARMACY CONSULT NOTE - ELECTROLYTES  Pharmacy Consult for Electrolyte Monitoring and Replacement   Recent Labs: Weight: 45.6 kg (100 lb 8.5 oz) (From Bed Scale per ED RN) Estimated Creatinine Clearance: 30.2 mL/min (A) (by C-G formula based on SCr of 1.05 mg/dL (H)). Potassium (mmol/L)  Date Value  03/19/2024 2.7 (LL)  10/07/2012 4.1   Magnesium  (mg/dL)  Date Value  92/91/7974 1.5 (L)  10/02/2012 2.0   Calcium  (mg/dL)  Date Value  92/91/7974 7.9 (L)   Calcium , Total (mg/dL)  Date Value  98/73/7985 8.2 (L)   Albumin (g/dL)  Date Value  92/91/7974 2.4 (L)   Phosphorus (mg/dL)  Date Value  92/91/7974 2.9   Sodium (mmol/L)  Date Value  03/19/2024 133 (L)  10/07/2012 142   Corrected Ca: 9.48 mg/dL  Assessment  Wanda Monroe is a 83 y.o. female presenting with respiratory distress. 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 Pharmacy has been consulted to monitor and replace electrolytes.  Diet: NPO MIVF: LR @ 150 mL/hr Pertinent medications: N/A  Goal of Therapy: Electrolytes WNL  Plan:  K 2.7: Kcl 10mEq IV x 6 Mag 1.5: Mag sulfate 2g IV x 1 Will recheck K later this afternoon/evening after IV runs have completed Check BMP, Mg, Phos with AM labs  Thank you for allowing pharmacy to be a part of this patient's care.  Wanda Monroe, PharmD Clinical Pharmacist 03/19/2024 7:39 AM

## 2024-03-19 NOTE — Progress Notes (Addendum)
 [facility] Liaison Note  This patient is currently followed by [facility]'s Home Based Primary Care and home health program.  [facility] will follow through discharge disposition.  Please call with any questions about home health and home based primary care.  [facility] Liaison  [phone]

## 2024-03-19 NOTE — Progress Notes (Signed)
 PHARMACIST - PHYSICIAN COMMUNICATION  CONCERNING:  Enoxaparin  (Lovenox ) for DVT Prophylaxis    RECOMMENDATION: Patient was prescribed enoxaprin 40mg  q24 hours for VTE prophylaxis.   There were no vitals filed for this visit.  There is no height or weight on file to calculate BMI.  CrCl cannot be calculated (Unknown ideal weight.).  Patient is candidate for enoxaparin  30mg  every 24 hours based on CrCl <53ml/min or Weight <45kg  DESCRIPTION: Pharmacy has adjusted enoxaparin  dose per [facility] policy.  Patient is now receiving enoxaparin  30 mg every 24 hours   Rankin CANDIE Dills, PharmD, [facility] 03/19/2024 2:45 AM

## 2024-03-19 NOTE — Assessment & Plan Note (Signed)
 Hold antihypertensives in the setting of severe sepsis

## 2024-03-19 NOTE — ED Provider Notes (Signed)
 [facility] Provider Note    Event Date/Time   First MD Initiated Contact with Patient 03/18/24 2131     (approximate)   History   Respiratory Distress   HPI Wanda Monroe is a 83 y.o. female with history of HTN, DM2, COPD on 2L baseline, CKD stage III presenting today for respiratory distress.  Family reported patient had worsening altered mental status throughout the day today associated with difficulty breathing.  She is normally on 2 L will sign to be hypoxic in the mid 70s.  Also found to be febrile.  No obvious other neurological deficits.  She can state her name and have a minimal conversation but does not know where she is or could contribute to history.  She denies any pain anywhere.  Collateral history obtained by family note that she had worsening mental status and breathing today.  She is DNR/DNI but otherwise full treatment.     Physical Exam   Triage Vital Signs: ED Triage Vitals  Encounter Vitals Group     BP 03/18/24 2135 (!) 141/58     Girls Systolic BP Percentile --      Girls Diastolic BP Percentile --      Boys Systolic BP Percentile --      Boys Diastolic BP Percentile --      Pulse Rate 03/18/24 2135 (!) 104     Resp 03/18/24 2135 (!) 31     Temp 03/18/24 2135 (!) 105 F (40.6 C)     Temp Source 03/18/24 2135 Rectal     SpO2 03/18/24 2135 98 %     Weight --      Height --      Head Circumference --      Peak Flow --      Pain Score 03/18/24 2133 0     Pain Loc --      Pain Education --      Exclude from Growth Chart --     Most recent vital signs: Vitals:   03/18/24 2345 03/18/24 2356  BP: (!) 105/43   Pulse:    Resp: 20   Temp:  100 F (37.8 C)  SpO2:     Physical Exam: I have reviewed the vital signs and nursing notes. General: Awake, alert, mild respiratory distress Head:  Atraumatic, normocephalic.   ENT:  EOM intact, PERRL. Oral mucosa is pink and moist with no lesions. Neck: Neck is supple with full  range of motion, No meningeal signs. Cardiovascular:  RRR, No murmurs. Peripheral pulses palpable and equal bilaterally. Respiratory:  Symmetrical chest wall expansion.  Diminished air movement throughout but no obvious wheezing or crackles. Musculoskeletal:  No cyanosis or edema. Moving extremities with full ROM Abdomen:  Soft, nontender, nondistended. Neuro:  GCS 14 with confusion, oriented to name but not place/time, moving all extremities with sensation appears intact and following all commands,, moving all four extremities, interacting appropriately. Speech clear. Psych:  Calm, appropriate.   Skin:  Warm, dry, no rash.    ED Results / Procedures / Treatments   Labs (all labs ordered are listed, but only abnormal results are displayed) Labs Reviewed  COMPREHENSIVE METABOLIC PANEL WITH GFR - Abnormal; Notable for the following components:      Result Value   Sodium 133 (*)    Potassium 2.9 (*)    Chloride 87 (*)    Glucose, Bld 143 (*)    Creatinine, Ser 1.37 (*)    Calcium  8.8 (*)  Albumin 3.2 (*)    GFR, Estimated 39 (*)    Anion gap 17 (*)    All other components within normal limits  CBC WITH DIFFERENTIAL/PLATELET - Abnormal; Notable for the following components:   WBC 1.0 (*)    RBC 3.44 (*)    Hemoglobin 9.5 (*)    HCT 30.2 (*)    RDW 19.1 (*)    nRBC 2.1 (*)    Neutro Abs 0.6 (*)    Lymphs Abs 0.3 (*)    All other components within normal limits  PROTIME-INR - Abnormal; Notable for the following components:   Prothrombin Time 15.8 (*)    All other components within normal limits  BLOOD GAS, VENOUS - Abnormal; Notable for the following components:   pH, Ven 7.55 (*)    pCO2, Ven 43 (*)    Bicarbonate 37.6 (*)    Acid-Base Excess 13.7 (*)    All other components within normal limits  RESP PANEL BY RT-PCR (RSV, FLU A&B, COVID)  RVPGX2  CULTURE, BLOOD (ROUTINE X 2)  CULTURE, BLOOD (ROUTINE X 2)  RESPIRATORY PANEL BY PCR  LACTIC ACID, PLASMA  LACTIC ACID,  PLASMA  URINALYSIS, W/ REFLEX TO CULTURE (INFECTION SUSPECTED)  PATHOLOGIST SMEAR REVIEW  URINALYSIS, ROUTINE W REFLEX MICROSCOPIC  COMPREHENSIVE METABOLIC PANEL WITH GFR     EKG My EKG interpretation: Rate of 105, sinus tachycardia, normal axis, normal intervals.  No acute ST elevations or depressions   RADIOLOGY Independently interpreted chest x-ray with possible infiltrates in the left base   PROCEDURES:  Critical Care performed: Yes, see critical care procedure note(s)  .Critical Care  Performed by: Malvina Alm DASEN, MD Authorized by: Malvina Alm DASEN, MD   Critical care provider statement:    Critical care time (minutes):  35   Critical care was necessary to treat or prevent imminent or life-threatening deterioration of the following conditions:  Respiratory failure and sepsis   Critical care was time spent personally by me on the following activities:  Development of treatment plan with patient or surrogate, discussions with consultants, evaluation of patient's response to treatment, examination of patient, ordering and review of laboratory studies, ordering and review of radiographic studies, ordering and performing treatments and interventions, pulse oximetry, re-evaluation of patient's condition and review of old charts   I assumed direction of critical care for this patient from another provider in my specialty: no     Care discussed with: admitting provider      MEDICATIONS ORDERED IN ED: Medications  lactated ringers  infusion ( Intravenous New Bag/Given 03/19/24 0003)  lactated ringers  bolus 1,000 mL (1,000 mLs Intravenous New Bag/Given 03/19/24 0004)  potassium chloride  10 mEq in 100 mL IVPB (has no administration in time range)  atorvastatin  (LIPITOR ) tablet 80 mg (has no administration in time range)  insulin  aspart (novoLOG ) injection 0-9 Units (has no administration in time range)  enoxaparin  (LOVENOX ) injection 40 mg (has no administration in time range)   metroNIDAZOLE  (FLAGYL ) IVPB 500 mg (has no administration in time range)  acetaminophen  (TYLENOL ) tablet 650 mg (has no administration in time range)    Or  acetaminophen  (TYLENOL ) suppository 650 mg (has no administration in time range)  ondansetron  (ZOFRAN ) tablet 4 mg (has no administration in time range)    Or  ondansetron  (ZOFRAN ) injection 4 mg (has no administration in time range)  ipratropium-albuterol  (DUONEB) 0.5-2.5 (3) MG/3ML nebulizer solution 3 mL (has no administration in time range)  LORazepam  (ATIVAN ) tablet 0.5 mg (has no  administration in time range)  lactated ringers  bolus 1,000 mL (0 mLs Intravenous Stopped 03/18/24 2356)  ceFEPIme  (MAXIPIME ) 2 g in sodium chloride  0.9 % 100 mL IVPB (0 g Intravenous Stopped 03/18/24 2237)  metroNIDAZOLE  (FLAGYL ) IVPB 500 mg (0 mg Intravenous Stopped 03/18/24 2326)  vancomycin  (VANCOCIN ) IVPB 1000 mg/200 mL premix (0 mg Intravenous Stopped 03/18/24 2356)  acetaminophen  (OFIRMEV ) IV 1,000 mg (0 mg Intravenous Stopped 03/18/24 2218)     IMPRESSION / MDM / ASSESSMENT AND PLAN / ED COURSE  I reviewed the triage vital signs and the nursing notes.                              Differential diagnosis includes, but is not limited to, pneumonia, COVID, flu, RSV, sepsis, UTI, COPD exacerbation  Patient's presentation is most consistent with acute presentation with potential threat to life or bodily function.  Patient is an 83 year old female presenting today in respiratory distress with fever.  On arrival she is febrile and tachycardic along with tachypnea.  Transitioned over to BiPAP with good maintaining of saturations and improvement in respiratory status.  No obvious wheezing at this time.  Given 1 L fluids, broad-spectrum antibiotics.  Lactic and blood cultures obtained with initial lactic normal.  Noted to have profound leukopenia and 1.0 with neutropenia 0.6 possibly indicating neutropenic fever as well.  She did receive Tylenol  with improvement in  her temperature.  Negative for COVID, flu, RSV.  Mild hypokalemia but otherwise creatinine comparable to baseline.  Able to be transition from BiPAP back to high flow nasal cannula with good support.  Suspect respiratory source of sepsis versus possible neutropenic fever.  Will admit to hospitalist at this time.  Family states no CPR or intubation but otherwise full care.  Hospitalist has agreed to admit patient for further care.  The patient is on the cardiac monitor to evaluate for evidence of arrhythmia and/or significant heart rate changes. Clinical Course as of 03/19/24 0032  Mon Mar 18, 2024  2236 Spoke with family.  No CPR or intubation.  Otherwise full care with antibiotics, pressors, and BiPAP as needed. [DW]    Clinical Course User Index [DW] Malvina Alm DASEN, MD     FINAL CLINICAL IMPRESSION(S) / ED DIAGNOSES   Final diagnoses:  Sepsis, due to unspecified organism, unspecified whether acute organ dysfunction present (HCC)  Acute hypoxic respiratory failure (HCC)  Leukopenia, unspecified type  Altered mental status, unspecified altered mental status type  AKI (acute kidney injury) (HCC)     Rx / DC Orders   ED Discharge Orders     None        Note:  This document was prepared using Dragon voice recognition software and may include unintentional dictation errors.   Malvina Alm DASEN, MD 03/19/24 [phone]

## 2024-03-19 NOTE — TOC CM/SW Note (Signed)
..  Transition of Care [facility]) - Inpatient Brief Assessment   Patient Details  Name: Wanda Monroe MRN: [id number] Date of Birth: [birth year]  Transition of Care [facility]) CM/SW Contact:    Edsel DELENA Fischer, LCSW Phone Number: 03/19/2024, 11:23 AM   Clinical Narrative: SW met with pt at bedside.  Pt expressed no needs at this time in the home.  Per chart, pt is receiving hospice care.  Pt stated that her nephew- Ozell come stays with her and that her neighbor-Rodney helps out as well.  SW reached out to nursing staff Alston Daring, RN, and Delon Side, RN)- I just left from pt room and she was stating that her catheter was cold and her buttocks. She seem very concerned about the catheter. She also expressed that she was cold as well and I wasn't sure if we could offer her a warm blanket. Also wanted someone to check if she had a bowel movement.  Transition of Care Asessment: Insurance and Status: Insurance coverage has been reviewed Patient has primary care physician: Yes Home environment has been reviewed: Pt lives in private home and stated that her nephew ozell comes to stay and that her neighbor-Rodney assits as well Prior level of function:: Pt is receiving hospice   Social Drivers of Health Review: SDOH reviewed no interventions necessary Readmission risk has been reviewed: Yes Transition of care needs: no transition of care needs at this time

## 2024-03-19 NOTE — Progress Notes (Signed)
 PROGRESS NOTE    Wanda Monroe  FMW:[id number] DOB: 1940/10/13 DOA: 03/18/2024 PCP: [facility]  [id number]  LOS: 1 day   Brief hospital course:   Assessment & Plan: Wanda Monroe is a 83 y.o. 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  She presented from her facility with respiratory distress, lethargy and confusion from baseline.  She was hypoxic to the 70s with EMS and placed on NRB for transport, transitioning to BiPAP on arrival and subsequently HFNC by the time of admission. Tmax in the ED was 105 and patient was tachycardic to 132 with respiratory rate up to 31.  O2 sats were in the high 90s on BiPAP.     * Sepsis due to pneumonia (HCC) Sepsis criteria include fever, tachycardia, tachypnea. --source presumed PNA.  CXR didn't show obvious consolidation, however, procal elevated at 5.64.  Received Broad-spectrum antibiotics of cefepime  vancomycin  and Flagyl  on presentation. --switch abx to ceftriaxone  and azithro  Acute on chronic respiratory failure with hypoxia (HCC) COPD on 2L baseline Patient presented with acute respiratory distress, hypoxic to the 70s with EMS, NRB-> BiPAP-> HFNC Secondary to suspected pneumonia No overt wheezing so exacerbation of COPD not suspected --down to 4L O2 today --Continue supplemental O2 to keep sats >=90%, wean as tolerated  Hypokalemia Hypomag --monitor and supplement PRN  Acute metabolic encephalopathy Dementia without behavioral disturbance Depression Patient presented with increased confusion from baseline for dementia, likely related to acute infection --delirium precaution    Hypotension --started on solu-cortef  on admission --cont MIVF.  Another 1L bolus today --start midodrine   History of hemorrhagic CVA (cerebrovascular accident) No acute issues suspected  Chronic kidney disease, stage 3a (HCC) Anion gap metabolic acidosis Expecting improvement with hydration  HTN (hypertension), currently not active Hold antihypertensives   Diabetes mellitus without complication (HCC) --ACHS and SSI for now  Frailty History of dementia, recent fall in May 2025 with multiple fractures Previously on hospice On chronic immunosuppressive therapy now with severe of infection High risk of mortality in the next 6 months  Rheumatoid arthritis (HCC) Holding immunosuppressive therapy --cont home oxycodone   Neutropenia  --possibly due to infection --monitor   DVT prophylaxis: Lovenox  SQ Code Status: DNR  Family Communication:  Level of care: Med-Surg Dispo:   The patient is from: SNF Anticipated d/c is to: SNF Anticipated d/c date is: 2-3 days   Subjective and Interval History:  Pt complained of dyspnea.  Also had painful bottom lip.   Objective: Vitals:   03/19/24 1200 03/19/24 1230 03/19/24 1300 03/19/24 1534  BP: (!) 93/45 (!) 103/45 (!) 115/47   Pulse: (!) 52 (!) 52 (!) 52   Resp: 18 19 13    Temp:    97.6 F (36.4 C)  TempSrc:    Oral  SpO2: 100% 100% 100%   Weight:        Intake/Output Summary (Last 24 hours) at 03/19/2024 1657 Last data filed at 03/19/2024 1331 Gross per 24 hour  Intake 2300 ml  Output 600 ml  Net 1700 ml   Filed Weights   03/19/24 0455  Weight: 45.6 kg    Examination:   Constitutional: NAD, alert, oriented to person and place HEENT: conjunctivae and lids normal, EOMI  CV: No cyanosis.   RESP: normal respiratory effort, on 4L sating 100% Neuro: II - XII grossly intact.   Psych: depressed mood and affect.     Data Reviewed: I have personally reviewed labs and imaging studies  Time spent: 50 minutes  Ellouise Haber, MD [facility] If 7PM-7AM, please contact night-coverage 03/19/2024, 4:57 PM

## 2024-03-19 NOTE — Progress Notes (Addendum)
 Pharmacy Antibiotic Note  Wanda Monroe is a 83 y.o. female admitted on 03/18/2024 with infection of unknown source.  Pharmacy has been consulted for Vancomycin  dosing for 7 days.  Plan: Pt given Vancomycin  1000 mg once. Vancomycin  1000 mg IV Q 48 hrs. Goal AUC 400-550. Expected AUC: 516 SCr used: 1.05, TBW 45.6 kg < IBW 61.6 kg  Pharmacy will continue to follow and will adjust abx dosing whenever warranted.  Temp (24hrs), Avg:102.5 F (39.2 C), Min:100 F (37.8 C), Max:105 F (40.6 C)   Recent Labs  Lab 03/18/24 2146 03/19/24 0005  WBC 1.0*  --   CREATININE 1.37*  --   LATICACIDVEN 1.8 0.9    CrCl cannot be calculated (Unknown ideal weight.).    Allergies  Allergen Reactions   Codeine Sulfate Nausea Only   Remicade [Infliximab] Other (See Comments)    Infection    Antimicrobials this admission: 7/07 Cefepime  >> x 7 days 7/07 Flagyl  >> x 7 days 7/07 Vancomycin  >> x 7 days  Microbiology results: 7/07 BCx: Pending  Thank you for allowing pharmacy to be a part of this patient's care.  [name], PharmD Clinical Pharmacist 03/19/2024 7:59 AM

## 2024-03-19 NOTE — ED Notes (Signed)
 Both IVs present in pt's RFA infiltrated. Moderate swelling noted to RFA. Pt only had LR infusing. Pharmacy contacted regarding infiltration. Pharmacist stated no antidote needed. Pt's arm elevated with pillows to help reduce swelling.

## 2024-03-20 ENCOUNTER — Inpatient Hospital Stay

## 2024-03-20 DIAGNOSIS — E43 Unspecified severe protein-calorie malnutrition: Secondary | ICD-10-CM | POA: Insufficient documentation

## 2024-03-20 DIAGNOSIS — J189 Pneumonia, unspecified organism: Secondary | ICD-10-CM | POA: Diagnosis not present

## 2024-03-20 DIAGNOSIS — A419 Sepsis, unspecified organism: Secondary | ICD-10-CM | POA: Diagnosis not present

## 2024-03-20 LAB — CBC
HCT: 25.3 % — ABNORMAL LOW (ref 36.0–46.0)
Hemoglobin: 7.7 g/dL — ABNORMAL LOW (ref 12.0–15.0)
MCH: 27.1 pg (ref 26.0–34.0)
MCHC: 30.4 g/dL (ref 30.0–36.0)
MCV: 89.1 fL (ref 80.0–100.0)
Platelets: 323 K/uL (ref 150–400)
RBC: 2.84 MIL/uL — ABNORMAL LOW (ref 3.87–5.11)
RDW: 19.2 % — ABNORMAL HIGH (ref 11.5–15.5)
WBC: 2.7 K/uL — ABNORMAL LOW (ref 4.0–10.5)
nRBC: 0 % (ref 0.0–0.2)

## 2024-03-20 LAB — BASIC METABOLIC PANEL WITH GFR
Anion gap: 8 (ref 5–15)
Anion gap: 11 (ref 5–15)
BUN: 17 mg/dL (ref 8–23)
BUN: 21 mg/dL (ref 8–23)
CO2: 28 mmol/L (ref 22–32)
CO2: 25 mmol/L (ref 22–32)
Calcium: 8 mg/dL — ABNORMAL LOW (ref 8.9–10.3)
Calcium: 8 mg/dL — ABNORMAL LOW (ref 8.9–10.3)
Chloride: 102 mmol/L (ref 98–111)
Chloride: 102 mmol/L (ref 98–111)
Creatinine, Ser: 1.06 mg/dL — ABNORMAL HIGH (ref 0.44–1.00)
Creatinine, Ser: 1.31 mg/dL — ABNORMAL HIGH (ref 0.44–1.00)
GFR, Estimated: 53 mL/min — ABNORMAL LOW (ref >60)
GFR, Estimated: 41 mL/min — ABNORMAL LOW (ref >60)
Glucose, Bld: 142 mg/dL — ABNORMAL HIGH (ref 70–99)
Glucose, Bld: 203 mg/dL — ABNORMAL HIGH (ref 70–99)
Potassium: 3.7 mmol/L (ref 3.5–5.1)
Potassium: 3.1 mmol/L — ABNORMAL LOW (ref 3.5–5.1)
Sodium: 138 mmol/L (ref 135–145)
Sodium: 138 mmol/L (ref 135–145)

## 2024-03-20 LAB — MAGNESIUM: Magnesium: 2.3 mg/dL (ref 1.7–2.4)

## 2024-03-20 LAB — GLUCOSE, CAPILLARY
Glucose-Capillary: 126 mg/dL — ABNORMAL HIGH (ref 70–99)
Glucose-Capillary: 146 mg/dL — ABNORMAL HIGH (ref 70–99)
Glucose-Capillary: 163 mg/dL — ABNORMAL HIGH (ref 70–99)
Glucose-Capillary: 171 mg/dL — ABNORMAL HIGH (ref 70–99)
Glucose-Capillary: 179 mg/dL — ABNORMAL HIGH (ref 70–99)
Glucose-Capillary: 212 mg/dL — ABNORMAL HIGH (ref 70–99)

## 2024-03-20 LAB — D-DIMER, QUANTITATIVE: D-Dimer, Quant: 1.33 ug{FEU}/mL — ABNORMAL HIGH (ref 0.00–0.50)

## 2024-03-20 MED ORDER — MAGIC MOUTHWASH
15.0000 mL | Freq: Four times a day (QID) | ORAL | Status: DC
  Administered 2024-03-20 – 2024-03-25 (×20): 15 mL via ORAL
  Filled 2024-03-20 (×22): qty 15

## 2024-03-20 MED ORDER — POTASSIUM CHLORIDE 20 MEQ PO PACK
40.0000 meq | PACK | Freq: Once | ORAL | Status: AC
  Administered 2024-03-20: 40 meq via ORAL
  Filled 2024-03-20: qty 2

## 2024-03-20 MED ORDER — FUROSEMIDE 10 MG/ML IJ SOLN
40.0000 mg | Freq: Once | INTRAMUSCULAR | Status: AC
  Administered 2024-03-20: 40 mg via INTRAVENOUS
  Filled 2024-03-20: qty 4

## 2024-03-20 MED ORDER — CHLORHEXIDINE GLUCONATE CLOTH 2 % EX PADS
6.0000 | MEDICATED_PAD | Freq: Every day | CUTANEOUS | Status: DC
  Administered 2024-03-20 – 2024-03-24 (×5): 6 via TOPICAL

## 2024-03-20 MED ORDER — ENSURE PLUS HIGH PROTEIN PO LIQD
237.0000 mL | Freq: Three times a day (TID) | ORAL | Status: DC
  Administered 2024-03-21 – 2024-03-24 (×10): 237 mL via ORAL

## 2024-03-20 MED ORDER — MORPHINE SULFATE (PF) 2 MG/ML IV SOLN
1.0000 mg | INTRAVENOUS | Status: DC | PRN
  Administered 2024-03-20 – 2024-03-21 (×2): 1 mg via INTRAVENOUS
  Filled 2024-03-20 (×2): qty 1

## 2024-03-20 MED ORDER — HYDROCORTISONE SOD SUC (PF) 100 MG IJ SOLR
100.0000 mg | Freq: Two times a day (BID) | INTRAMUSCULAR | Status: DC
  Administered 2024-03-21: 100 mg via INTRAVENOUS
  Filled 2024-03-20 (×2): qty 2

## 2024-03-20 MED ORDER — MORPHINE SULFATE (PF) 2 MG/ML IV SOLN: 1.0000 mg | Freq: Four times a day (QID) | INTRAVENOUS | Status: DC | PRN

## 2024-03-20 MED ORDER — ENSURE PLUS HIGH PROTEIN PO LIQD
237.0000 mL | Freq: Two times a day (BID) | ORAL | Status: DC
  Administered 2024-03-20: 237 mL via ORAL

## 2024-03-20 MED ORDER — ADULT MULTIVITAMIN W/MINERALS CH
1.0000 | ORAL_TABLET | Freq: Every day | ORAL | Status: DC
  Administered 2024-03-21 – 2024-03-25 (×5): 1 via ORAL
  Filled 2024-03-20 (×5): qty 1

## 2024-03-20 MED ORDER — ADULT MULTIVITAMIN W/MINERALS CH
1.0000 | ORAL_TABLET | Freq: Every day | ORAL | Status: DC
  Filled 2024-03-20: qty 1

## 2024-03-20 MED ORDER — B COMPLEX-C PO TABS
1.0000 | ORAL_TABLET | Freq: Every day | ORAL | Status: DC
  Administered 2024-03-20 – 2024-03-25 (×6): 1 via ORAL
  Filled 2024-03-20 (×6): qty 1

## 2024-03-20 MED ORDER — HYDROXYZINE HCL 50 MG PO TABS
50.0000 mg | ORAL_TABLET | Freq: Three times a day (TID) | ORAL | Status: DC | PRN
  Administered 2024-03-22 – 2024-03-25 (×6): 50 mg via ORAL
  Filled 2024-03-20 (×8): qty 1

## 2024-03-20 MED ORDER — MORPHINE SULFATE (PF) 2 MG/ML IV SOLN
1.0000 mg | Freq: Once | INTRAVENOUS | Status: AC
  Administered 2024-03-20: 1 mg via INTRAVENOUS
  Filled 2024-03-20: qty 1

## 2024-03-20 NOTE — Progress Notes (Addendum)
 Initial Nutrition Assessment  DOCUMENTATION CODES:   Severe malnutrition in context of acute illness/injury, Underweight  INTERVENTION:   -Continue full liquid diet -Ensure Plus High Protein po TID, each supplement provides 350 kcal and 20 grams of protein  -Magic cup TID with meals, each supplement provides 290 kcal and 9 grams of protein  -If oral intake does not improve, may need to consider enteral nutrition support if this aligns with pt's goals of care  -Per MD request, RD will evaluate for riboflavin deficiency to rule out possible etiology for burning/ mouth pain -B-complex with vitamin C  NUTRITION DIAGNOSIS:   Severe Malnutrition related to acute illness as evidenced by percent weight loss, moderate fat depletion, severe fat depletion, moderate muscle depletion, severe muscle depletion.  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  Pt admitted with sepsis secondary to pneumonia.   7/8- s/p BSE- full liquid diet  Reviewed I/O's: +390 ml x 24 hours and +1.7 L since admission  UOP: 1.2 L x 24 hours  Per SLP notes, pt with oropharyngeal discomfort, which is impacting swallowing. SLP messaged MD for request of oral rinse for treatment of erythema and ulcerated spots.   Spoke with pt at bedside, who reports feeling very poorly and weak at time of visit. Pt spoke in a whisper. She shares that she has had  ongoing poor oral intake over the past 3-4 months due to oral pain. This has worsened over the past month. Pt describes a burning sensation whenever she tries to swallow and this has been significantly impacting her oral intake. Pt consumed a cup of ice cream a a few sips of water at time of RD visit. Per pt, there are no foods or liquids that make the pain worse or better- anything she tries to consume burns.   Pt endorses wt loss, but unsure of UBW or how much she last lost. Reviewed wt hx; pt has experienced a 20.8% wt loss over the past month., which is significant for time frame.   Noted Magic Mouthwash ordered today.   Discussed importance of good meal and supplement intake to promote healing. Pt amenable to supplements., Given restrictions of diet and painful swallowing, may need to consider enteral nutrition support if this aligns with pt's goals of care.   Recommendations discussed with RN, MD, and SLP.   MD would like pt to assess for nutritional deficiencies which may related to mouth pain (riboflavin deficiency). Will also add B vitamin supplement for potential deficiency.   Medications reviewed and include rocephin .   Lab Results  Component Value Date   HGBA1C 6.3 (H) 01/18/2024   PTA DM medications are 500 mg metofrmin BID.   Labs reviewed: CBGS: 116-164 (inpatient orders for glycemic control are 0-9 units insulin  aspart every 4 hours).    NUTRITION - FOCUSED PHYSICAL EXAM:  Flowsheet Row Most Recent Value  Orbital Region Severe depletion  Upper Arm Region Severe depletion  Thoracic and Lumbar Region Severe  depletion  Buccal Region Moderate depletion  Temple Region Severe depletion  Clavicle Bone Region Severe depletion  Clavicle and Acromion Bone Region Severe depletion  Scapular Bone Region Severe depletion  Dorsal Hand Severe depletion  Patellar Region Mild depletion  Anterior Thigh Region Mild depletion  Posterior Calf Region Mild depletion  Edema (RD  Assessment) None  Hair Reviewed  Eyes Reviewed  Mouth Reviewed  Skin Reviewed  Nails Reviewed    Diet Order:   Diet Order             Diet full liquid Room service appropriate? Yes with Assist; Fluid consistency: Thin  Diet effective now                   EDUCATION NEEDS:   Education needs have been addressed  Skin:  Skin Assessment: Reviewed RN Assessment  Last BM:  03/20/24 (type 6)  Height:   Ht Readings from Last 1 Encounters:  03/20/24 5' 7 (1.702 m)    Weight:   Wt Readings from Last 1 Encounters:  03/19/24 45.6 kg    Ideal Body Weight:  61.4 kg  BMI:  Body mass index is 15.75 kg/m.  Estimated Nutritional Needs:   Kcal:  1600-1800  Protein:  80-95 grams  Fluid:  1.6-1.8 L    Margery ORN, RD, LDN, CDCES Registered Dietitian III Certified Diabetes Care and Education Specialist If unable to reach this RD, please use RD Inpatient group chat on secure chat between hours of 8am-4 pm daily

## 2024-03-20 NOTE — Progress Notes (Signed)
  Chaplain On-Call responded to Spiritual Care Consult Order from Ellouise Haber, MD.  The order noted patient's statements to the Physician: Why is God keeping me here? I am ready to go. The Physician also reported cognitive decline and dementia.  Chaplain met the patient and provided spiritual and emotional support and prayer with the patient, who had difficulty speaking due to nasal cannula.  Chaplain Bebe Ardean EMERSON Hershal., [facility]

## 2024-03-20 NOTE — Consult Note (Signed)
 PHARMACY CONSULT NOTE - ELECTROLYTES  Pharmacy Consult for Electrolyte Monitoring and Replacement   Recent Labs: Height: 5' 7 (170.2 cm) Weight: 45.6 kg (100 lb 8.5 oz) (From Bed Scale per ED RN) IBW/kg (Calculated) : 61.6 Estimated Creatinine Clearance: 30 mL/min (A) (by C-G formula based on SCr of 1.06 mg/dL (H)). Potassium (mmol/L)  Date Value  03/20/2024 3.7  10/07/2012 4.1   Magnesium  (mg/dL)  Date Value  92/90/7974 2.3  10/02/2012 2.0   Calcium  (mg/dL)  Date Value  92/90/7974 8.0 (L)   Calcium , Total (mg/dL)  Date Value  98/73/7985 8.2 (L)   Albumin (g/dL)  Date Value  92/91/7974 2.4 (L)   Phosphorus (mg/dL)  Date Value  92/91/7974 2.9   Sodium (mmol/L)  Date Value  03/20/2024 138  10/07/2012 142   Corrected Ca: 9.48 mg/dL  Assessment  Wanda Monroe is a 83 y.o. female presenting with respiratory distress. 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 Pharmacy has been consulted to monitor and replace electrolytes.  Diet: full liquid MIVF:none Pertinent medications: lasix  40 mg IV x 1 given 7/9  Goal of Therapy: Electrolytes WNL  Plan:  K 3.7  lasix  dose given this am.  Will order KCL 40 meq PO x 1 Check electrolytes with AM labs  Thank you for allowing pharmacy to be a part of this patient's care.  Suzann Allean LABOR, PharmD Clinical Pharmacist 03/20/2024 10:18 AM

## 2024-03-20 NOTE — Plan of Care (Signed)
  Problem: Education: Goal: Ability to describe self-care measures that may prevent or decrease complications (Diabetes Survival Skills Education) will improve Outcome: Progressing Goal: Individualized Educational Video(s) Outcome: Progressing   Problem: Metabolic: Goal: Ability to maintain appropriate glucose levels will improve Outcome: Progressing   Problem: Respiratory: Goal: Ability to maintain adequate ventilation will improve Outcome: Progressing

## 2024-03-20 NOTE — Progress Notes (Signed)
 Rapid Response Event Note   Reason for Call : hypotension and unable to obtain sp02 reading.   Initial Focused Assessment: Upon arrival patient sitting up in bed talking in complete sentences and following commands. Primary, 1A charge, and Dr. Awanda at bedside.  Order had already been obtained for ABG and was being drawn by respiratory therapist. Low BP had been obtained with small cuff on left lower leg.       Interventions: This nurse placed adult BP cuff on left are and obtained BP 115/79.  Lungs auscultated and where clear bilateral and diminished to bases.  ABG result resulted and given to Dr. Awanda.     Plan of Care: Patient to be given dose of morphine  and possibly having further imagining of chest ordered per MD.     Event Summary: Patient remained in the room at this time.   MD Notified: 18:46 Call Time:18:46 Arrival Time:18:50 End Time:19:06  Vernell Verla Horseman, RN

## 2024-03-20 NOTE — Progress Notes (Signed)
 PROGRESS NOTE    Wanda Monroe  FMW:[id number] DOB: 1940-12-08 DOA: 03/18/2024 PCP: [facility]  [id number]  LOS: 2 days   Brief hospital course:   Assessment & Plan: LARYSA PALL is a 83 y.o. 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    She presented from her facility with respiratory distress, lethargy and confusion from baseline.  She was hypoxic to the 70s with EMS and placed on NRB for transport, transitioning to BiPAP on arrival and subsequently HFNC by the time of admission. Tmax in the ED was 105 (rectal) and patient was tachycardic to 132 with respiratory rate up to 31.  O2 sats were in the high 90s on BiPAP.     * Sepsis due to pneumonia (HCC) Sepsis criteria include fever, tachycardia, tachypnea. --source presumed PNA.  CXR didn't show obvious consolidation, however, procal elevated at 5.64.  Received Broad-spectrum antibiotics of cefepime  vancomycin  and Flagyl  on presentation. --cont ceftriaxone  and azithromycin   Acute on chronic respiratory failure with hypoxia (HCC) COPD on 2L baseline Patient presented with acute respiratory distress, hypoxic to the 70s with EMS, NRB-> BiPAP-> HFNC Secondary to suspected pneumonia No overt wheezing so exacerbation of COPD not suspected --down to 2L O2 today, however, having subjective feeling of dyspnea, possibly due to fluid overload from IVF received on presentation. --IV lasix  40 x2 today  Hypokalemia Hypomag --monitor and supplement PRN  Acute metabolic encephalopathy Dementia without behavioral disturbance Depression Patient presented with increased confusion from baseline for dementia, likely related to acute  infection --delirium precaution   Hypotension --received boluses f/b MIVF.  started on solu-cortef  on admission.  --cont solu-cortef  100 mg for now but decrease from TID to BID --cont midodrine  (new)  History of hemorrhagic CVA (cerebrovascular accident) No acute issues suspected  Chronic kidney disease, stage 3a (HCC) Anion gap metabolic acidosis Expecting improvement with hydration  HTN (hypertension), currently not active Hold antihypertensives   Diabetes mellitus without complication (HCC) Hyperglycemia due to steroid use --ACHS and SSI for now  Frailty History of dementia, recent fall in May 2025 with multiple fractures Previously on hospice On chronic immunosuppressive therapy now with severe of infection High risk of mortality in the next 6 months  Rheumatoid arthritis (HCC) Holding immunosuppressive therapy --hold home oxycodone  due to intermittent hypotension  Neutropenia  --possibly due to infection --monitor  Anxiety --atarax  PRN   DVT prophylaxis: Lovenox  SQ Code Status: DNR  Family Communication:  Level of care: Med-Surg Dispo:   The patient is from: SNF Anticipated d/c is to: SNF Anticipated d/c date is: 2-3 days   Subjective and Interval History:  Pt was very anxious and breathing fast this morning, kept saying she was going to die.  Sating well on 2L O2.  Left forearm cast had become very tight.   Objective: Vitals:   03/20/24 0839 03/20/24 0949 03/20/24 1355 03/20/24 1533  BP: 90/71 (!) 142/99 (!) 90/36 114/89  Pulse: 61 63 (!) 56 96  Resp: (!) 24 20 17    Temp: 97.7 F (36.5 C) 97.8 F (36.6 C) 97.7 F (36.5 C)   TempSrc: Oral  Oral   SpO2: 96% 100% 100%   Weight:      Height:  Intake/Output Summary (Last 24 hours) at 03/20/2024 1815 Last data filed at 03/20/2024 9374 Gross per 24 hour  Intake 615.14 ml  Output 625 ml  Net -9.86 ml   Filed Weights   03/19/24 0455  Weight: 45.6 kg    Examination:   Constitutional:  NAD, alert, oriented to person and place HEENT: conjunctivae and lids normal, EOMI CV: No cyanosis.   RESP: increase RR, on 2L Extremities: left forearm cast tight  SKIN: warm, dry Neuro: II - XII grossly intact.   Psych: anxious mood and affect.     Data Reviewed: I have personally reviewed labs and imaging studies  Time spent: 50 minutes  Ellouise Haber, MD [facility] If 7PM-7AM, please contact night-coverage 03/20/2024, 6:15 PM

## 2024-03-21 DIAGNOSIS — F32A Depression, unspecified: Secondary | ICD-10-CM

## 2024-03-21 DIAGNOSIS — D709 Neutropenia, unspecified: Secondary | ICD-10-CM | POA: Diagnosis not present

## 2024-03-21 DIAGNOSIS — I10 Essential (primary) hypertension: Secondary | ICD-10-CM

## 2024-03-21 DIAGNOSIS — Z9189 Other specified personal risk factors, not elsewhere classified: Secondary | ICD-10-CM

## 2024-03-21 DIAGNOSIS — Z8673 Personal history of transient ischemic attack (TIA), and cerebral infarction without residual deficits: Secondary | ICD-10-CM

## 2024-03-21 DIAGNOSIS — R5081 Fever presenting with conditions classified elsewhere: Secondary | ICD-10-CM

## 2024-03-21 DIAGNOSIS — N1831 Chronic kidney disease, stage 3a: Secondary | ICD-10-CM

## 2024-03-21 DIAGNOSIS — J189 Pneumonia, unspecified organism: Secondary | ICD-10-CM | POA: Diagnosis not present

## 2024-03-21 DIAGNOSIS — M069 Rheumatoid arthritis, unspecified: Secondary | ICD-10-CM

## 2024-03-21 DIAGNOSIS — J9621 Acute and chronic respiratory failure with hypoxia: Secondary | ICD-10-CM

## 2024-03-21 DIAGNOSIS — E43 Unspecified severe protein-calorie malnutrition: Secondary | ICD-10-CM

## 2024-03-21 DIAGNOSIS — F0390 Unspecified dementia without behavioral disturbance: Secondary | ICD-10-CM

## 2024-03-21 DIAGNOSIS — G9341 Metabolic encephalopathy: Secondary | ICD-10-CM

## 2024-03-21 DIAGNOSIS — J449 Chronic obstructive pulmonary disease, unspecified: Secondary | ICD-10-CM

## 2024-03-21 DIAGNOSIS — E876 Hypokalemia: Secondary | ICD-10-CM

## 2024-03-21 DIAGNOSIS — A419 Sepsis, unspecified organism: Secondary | ICD-10-CM | POA: Diagnosis not present

## 2024-03-21 DIAGNOSIS — E119 Type 2 diabetes mellitus without complications: Secondary | ICD-10-CM

## 2024-03-21 LAB — GLUCOSE, CAPILLARY
Glucose-Capillary: 161 mg/dL — ABNORMAL HIGH (ref 70–99)
Glucose-Capillary: 169 mg/dL — ABNORMAL HIGH (ref 70–99)
Glucose-Capillary: 244 mg/dL — ABNORMAL HIGH (ref 70–99)
Glucose-Capillary: 195 mg/dL — ABNORMAL HIGH (ref 70–99)
Glucose-Capillary: 236 mg/dL — ABNORMAL HIGH (ref 70–99)
Glucose-Capillary: 177 mg/dL — ABNORMAL HIGH (ref 70–99)

## 2024-03-21 LAB — CBC
HCT: 23.6 % — ABNORMAL LOW (ref 36.0–46.0)
Hemoglobin: 7.3 g/dL — ABNORMAL LOW (ref 12.0–15.0)
MCH: 27.1 pg (ref 26.0–34.0)
MCHC: 30.9 g/dL (ref 30.0–36.0)
MCV: 87.7 fL (ref 80.0–100.0)
Platelets: 502 K/uL — ABNORMAL HIGH (ref 150–400)
RBC: 2.69 MIL/uL — ABNORMAL LOW (ref 3.87–5.11)
RDW: 19.6 % — ABNORMAL HIGH (ref 11.5–15.5)
WBC: 2.9 K/uL — ABNORMAL LOW (ref 4.0–10.5)
nRBC: 0 % (ref 0.0–0.2)

## 2024-03-21 LAB — BASIC METABOLIC PANEL WITH GFR
Anion gap: 10 (ref 5–15)
BUN: 21 mg/dL (ref 8–23)
CO2: 28 mmol/L (ref 22–32)
Calcium: 7.7 mg/dL — ABNORMAL LOW (ref 8.9–10.3)
Chloride: 101 mmol/L (ref 98–111)
Creatinine, Ser: 1.46 mg/dL — ABNORMAL HIGH (ref 0.44–1.00)
GFR, Estimated: 36 mL/min — ABNORMAL LOW (ref >60)
Glucose, Bld: 175 mg/dL — ABNORMAL HIGH (ref 70–99)
Potassium: 3.1 mmol/L — ABNORMAL LOW (ref 3.5–5.1)
Sodium: 139 mmol/L (ref 135–145)

## 2024-03-21 LAB — BLOOD GAS, VENOUS
Bicarbonate: 37.6 mmol/L — ABNORMAL HIGH (ref 20.0–28.0)
Patient temperature: 37 mmol/L — AB (ref 0.0–2.0)
pCO2, Ven: 43 mmHg — ABNORMAL LOW (ref 44–60)
pH, Ven: 7.55 — ABNORMAL HIGH (ref 7.25–7.43)
pO2, Ven: 37.6 mmol/L — AB (ref 32–45)

## 2024-03-21 LAB — PATHOLOGIST SMEAR REVIEW

## 2024-03-21 LAB — MAGNESIUM: Magnesium: 1.9 mg/dL (ref 1.7–2.4)

## 2024-03-21 MED ORDER — PREDNISONE 10 MG PO TABS: 10.0000 mg | ORAL_TABLET | Freq: Every day | ORAL | Status: DC

## 2024-03-21 MED ORDER — PREDNISONE 20 MG PO TABS
20.0000 mg | ORAL_TABLET | Freq: Every day | ORAL | Status: AC
  Administered 2024-03-25: 20 mg via ORAL
  Filled 2024-03-21: qty 1

## 2024-03-21 MED ORDER — PREDNISONE 50 MG PO TABS
50.0000 mg | ORAL_TABLET | Freq: Every day | ORAL | Status: AC
  Administered 2024-03-22: 50 mg via ORAL
  Filled 2024-03-21: qty 1

## 2024-03-21 MED ORDER — SODIUM CHLORIDE 0.9 % IV SOLN
2.0000 g | INTRAVENOUS | Status: DC
  Administered 2024-03-21 – 2024-03-24 (×4): 2 g via INTRAVENOUS
  Filled 2024-03-21 (×4): qty 20

## 2024-03-21 MED ORDER — POTASSIUM CHLORIDE 20 MEQ PO PACK
40.0000 meq | PACK | ORAL | Status: AC
  Administered 2024-03-21 (×2): 40 meq via ORAL
  Filled 2024-03-21 (×2): qty 2

## 2024-03-21 MED ORDER — PREDNISONE 20 MG PO TABS
40.0000 mg | ORAL_TABLET | Freq: Every day | ORAL | Status: AC
  Administered 2024-03-23: 40 mg via ORAL
  Filled 2024-03-21: qty 2

## 2024-03-21 MED ORDER — FE FUM-VIT C-VIT B12-FA 460-60-0.01-1 MG PO CAPS
1.0000 | ORAL_CAPSULE | Freq: Two times a day (BID) | ORAL | Status: DC
  Administered 2024-03-21 – 2024-03-25 (×8): 1 via ORAL
  Filled 2024-03-21 (×8): qty 1

## 2024-03-21 MED ORDER — LOPERAMIDE HCL 2 MG PO CAPS
2.0000 mg | ORAL_CAPSULE | ORAL | Status: DC | PRN
  Administered 2024-03-21 – 2024-03-25 (×5): 2 mg via ORAL
  Filled 2024-03-21 (×5): qty 1

## 2024-03-21 MED ORDER — HYDROCORTISONE SOD SUC (PF) 100 MG IJ SOLR
50.0000 mg | Freq: Once | INTRAMUSCULAR | Status: AC
  Administered 2024-03-21: 50 mg via INTRAVENOUS
  Filled 2024-03-21: qty 1

## 2024-03-21 MED ORDER — PREDNISONE 20 MG PO TABS
30.0000 mg | ORAL_TABLET | Freq: Every day | ORAL | Status: AC
  Administered 2024-03-24: 30 mg via ORAL
  Filled 2024-03-21: qty 1

## 2024-03-21 MED ORDER — LACTATED RINGERS IV SOLN: INTRAVENOUS | Status: AC

## 2024-03-21 NOTE — Plan of Care (Signed)

## 2024-03-21 NOTE — Consult Note (Addendum)
 PHARMACY CONSULT NOTE - ELECTROLYTES  Pharmacy Consult for Electrolyte Monitoring and Replacement   Recent Labs: Height: 5' 7 (170.2 cm) Weight: 45.6 kg (100 lb 8.5 oz) (From Bed Scale per ED RN) IBW/kg (Calculated) : 61.6 Estimated Creatinine Clearance: 21.8 mL/min (A) (by C-G formula based on SCr of 1.46 mg/dL (H)). Potassium (mmol/L)  Date Value  03/21/2024 3.1 (L)  10/07/2012 4.1   Magnesium  (mg/dL)  Date Value  92/90/7974 2.3  10/02/2012 2.0   Calcium  (mg/dL)  Date Value  92/89/7974 7.7 (L)   Calcium , Total (mg/dL)  Date Value  98/73/7985 8.2 (L)   Albumin (g/dL)  Date Value  92/91/7974 2.4 (L)   Phosphorus (mg/dL)  Date Value  92/91/7974 2.9   Sodium (mmol/L)  Date Value  03/21/2024 139  10/07/2012 142   Corrected Ca: 9.48 mg/dL  Assessment  Wanda Monroe is a 83 y.o. female presenting with respiratory distress. 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 Pharmacy has been consulted to monitor and replace electrolytes.  Diet: full liquid,   per Speech 7/8: Please give Pills CRUSHED in Puree for safer, easier oral phase swallowing.  MIVF:none Pertinent medications: lasix  40 mg IV given 7/9 am and pm  Goal of Therapy: Electrolytes WNL  Plan:  K 3.1  lasix  dose given yesterday am and pm .  Will order KCL 40 meq PO x 2 Check electrolytes with AM labs  Thank you for allowing pharmacy to be a part of this patient's care.  Suzann Allean LABOR, PharmD Clinical Pharmacist 03/21/2024 7:37 AM

## 2024-03-21 NOTE — Evaluation (Signed)
 Physical Therapy Evaluation Patient Details Name: Wanda Monroe MRN: [id number] DOB: 23-Dec-1940 Today's Date: 03/21/2024  History of Present Illness  Pt is an 83 y.o. 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   Clinical Impression  Patient alert, agreeable to mobility, oriented to self only. Perseverating on making a phone call, number written down and tried for patient without success. Pt verbose and unable to provide clear PLOF, but per pt/chart, lives alone, ambulatory with RW. Per chart review several recent falls.   minA to sit EOB, unable to move LLE due to pain, assistance needed. Sit <> stand with modA and RW. Improved standing balance to requiring minA, but only able to take a few steps towards [facility] prior to needing to rest due to fatigue. minA to return to supine.  Overall the patient demonstrated deficits (see PT Problem List) that impede the patient's functional abilities, safety, and mobility and would benefit from skilled PT intervention.          If plan is discharge home, recommend the following: A little help with bathing/dressing/bathroom;Direct supervision/assist for financial management;Assist for transportation;Assistance with feeding;Assistance with cooking/housework;A little help with walking and/or transfers   Can travel by private vehicle   No    Equipment Recommendations Other (comment) (TBD)  Recommendations for Other Services       Functional Status Assessment Patient has had a recent decline in their functional status and demonstrates the ability to make significant improvements in function in a reasonable and predictable amount of time.     Precautions /  Restrictions Precautions Precautions: Fall Recall of Precautions/Restrictions: Impaired Restrictions Weight Bearing Restrictions Per Provider Order: No      Mobility  Bed Mobility Overal bed mobility: Needs Assistance Bed Mobility: Supine to Sit, Sit to Supine     Supine to sit: Min assist, HOB elevated, Used rails Sit to supine: Min assist, Used rails        Transfers Overall transfer level: Needs assistance Equipment used: Rolling walker (2 wheels) Transfers: Sit to/from Stand Sit to Stand: Mod assist                Ambulation/Gait Ambulation/Gait assistance: Min assist Gait Distance (Feet): 2 Feet           General Gait Details: able to side step at EOB, laterally. minA for stabilizing, moving RW  Stairs            Wheelchair Mobility     Tilt Bed    Modified Rankin (Stroke Patients Only)       Balance Overall balance assessment: Needs assistance Sitting-balance support: Feet supported Sitting balance-Leahy Scale: Good     Standing balance support: During functional activity, Bilateral upper extremity supported, Reliant on assistive device for balance Standing balance-Leahy Scale: Poor                               Pertinent Vitals/Pain Pain Assessment Pain Assessment: Faces Faces Pain Scale: Hurts little more Pain Location: L hip Pain Descriptors / Indicators: Grimacing, Guarding, Moaning Pain Intervention(s): Limited activity within patient's tolerance, Monitored during session, Repositioned    Home Living Family/patient expects to be discharged to:: Private residence Living  Arrangements: Alone Available Help at Discharge: Friend(s);Available PRN/intermittently Type of Home: House Home Access: Ramped entrance       Home Layout: One level Home Equipment: Cane - quad;Medical illustrator (2 wheels) Additional Comments: Nephew spends the night (lives next door) and friend lives/works nearby -  states he comes any time she calls for help    Prior Function Prior Level of Function : Needs assist             Mobility Comments: pt unable to give clear PLOF but per chart ambulatory with walker, lives alone but has had falls       Extremity/Trunk Assessment   Upper Extremity Assessment Upper Extremity Assessment: Generalized weakness    Lower Extremity Assessment Lower Extremity Assessment: Generalized weakness (unable to move LLE against gravity due to pain)       Communication        Cognition Arousal: Alert Behavior During Therapy: WFL for tasks assessed/performed   PT - Cognitive impairments: History of cognitive impairments                       PT - Cognition Comments: pt oriented to self only, perseverating on a phone number Following commands: Intact       Cueing       General Comments      Exercises     Assessment/Plan    PT Assessment Patient needs continued PT services  PT Problem List Decreased strength;Decreased range of motion;Decreased activity tolerance;Decreased balance;Decreased mobility       PT Treatment Interventions DME instruction;Balance training;Neuromuscular re-education;Gait training;Stair training;Functional mobility training;Patient/family education;Therapeutic activities;Therapeutic exercise    PT Goals (Current goals can be found in the Care Plan section)  Acute Rehab PT Goals Patient Stated Goal: to feel better PT Goal Formulation: With patient Time For Goal Achievement: 04/04/24 Potential to Achieve Goals: Good    Frequency Min 2X/week     Co-evaluation               AM-PAC PT 6 Clicks Mobility  Outcome Measure Help needed turning from your back to your side while in a flat bed without using bedrails?: A Little Help needed moving from lying on your back to sitting on the side of a flat bed without using bedrails?: A Little Help needed moving to and from a bed to a chair (including a  wheelchair)?: A Little Help needed standing up from a chair using your arms (e.g., wheelchair or bedside chair)?: A Little Help needed to walk in hospital room?: A Lot Help needed climbing 3-5 steps with a railing? : Total 6 Click Score: 15    End of Session   Activity Tolerance: Patient limited by fatigue Patient left: with call bell/phone within reach;in bed;with bed alarm set Nurse Communication: Mobility status PT Visit Diagnosis: Other abnormalities of gait and mobility (R26.89);Difficulty in walking, not elsewhere classified (R26.2);Muscle weakness (generalized) (M62.81)    Time: 8484-8475 PT Time Calculation (min) (ACUTE ONLY): 9 min   Charges:   PT Evaluation $PT Eval Low Complexity: 1 Low   PT General Charges $$ ACUTE PT VISIT: 1 Visit       Doyal Shams PT, DPT 3:52 PM,03/21/24

## 2024-03-21 NOTE — Progress Notes (Signed)
   03/21/24 1030  Spiritual Encounters  Type of Visit Follow up  Care provided to: Patient  Referral source Chaplain team  Reason for visit Routine spiritual support  OnCall Visit Yes  Spiritual Framework  Presenting Themes Goals in life/care;Values and beliefs;Coping tools;Impactful experiences and emotions  Patient Stress Factors Other (Comment) (Pt let me know that she is frightened because she can't breathe and asked for prayer.)  Interventions  Spiritual Care Interventions Made Established relationship of care and support;Compassionate presence;Reflective listening;Prayer;Encouragement  Intervention Outcomes  Outcomes Connection to spiritual care;Awareness around self/spiritual resourses;Awareness of health;Awareness of support;Reduced anxiety   Pt drifted off to sleep after I prayed w/her.

## 2024-03-21 NOTE — Progress Notes (Signed)
 PROGRESS NOTE    Wanda Monroe  FMW:[id number] DOB: December 23, 1940 DOA: 03/18/2024 PCP: [facility]  [id number]  LOS: 3 days   Brief hospital course:  Wanda Monroe is a 83 y.o. 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    She presented from her facility with respiratory distress, lethargy and confusion from baseline.  She was hypoxic to the 70s with EMS and placed on NRB for transport, transitioning to BiPAP on arrival and subsequently HFNC by the time of admission. Tmax in the ED was 105 (rectal) and patient was tachycardic to 132 with respiratory rate up to 31.  O2 sats were in the high 90s on BiPAP.    7/10: Vital stable, mild hypokalemia which is being repleted.  Hemoglobin at 7.3, checking anemia panel and starting on supplement.  Slight increase in creatinine to 1.46-patient did receive a dose of IV Lasix  yesterday.  PT and OT evaluation ordered.  Assessment & Plan:   * Sepsis due to pneumonia (HCC) Sepsis criteria include fever, tachycardia, tachypnea. --source presumed PNA.  CXR didn't show obvious consolidation, however, procal elevated at 5.64.  Received Broad-spectrum antibiotics of cefepime  vancomycin  and Flagyl  on presentation. --cont ceftriaxone  and azithromycin   Acute on chronic respiratory failure with hypoxia (HCC) COPD on 2L baseline Patient presented with acute respiratory distress, hypoxic to the 70s with EMS, NRB-> BiPAP-> HFNC Secondary to suspected pneumonia No overt wheezing so exacerbation of COPD not suspected --down to 2L O2 today,   Hypokalemia Hypomag Potassium of 3.1 with magnesium  of 1.9 today - Replete potassium and monitor  Acute  metabolic encephalopathy Dementia without behavioral disturbance Depression Patient presented with increased confusion from baseline for dementia, likely related to acute infection --delirium precaution   Hypotension --received boluses f/b MIVF.  started on solu-cortef  on admission.  --cont solu-cortef  -starting a quick taper --cont midodrine  (new)  History of hemorrhagic CVA (cerebrovascular accident) No acute issues suspected  Chronic kidney disease, stage 3a (HCC) Anion gap metabolic acidosis Expecting improvement with hydration  HTN (hypertension), currently not active Hold antihypertensives   Diabetes mellitus without complication (HCC) Hyperglycemia due to steroid use --ACHS and SSI for now  Normocytic anemia. - Slowly decreasing hemoglobin without any obvious bleeding.  At 7.3 today. - Check anemia panel - Starting her on supplement - Monitor hemoglobin - Transfuse if below 7  Frailty History of dementia, recent fall in May 2025 with multiple fractures Previously on hospice On chronic immunosuppressive therapy now with severe of infection High risk of mortality in the next 6 months PT and OT also ordered  Rheumatoid arthritis (HCC) Holding immunosuppressive therapy --hold home oxycodone  due to intermittent hypotension  Neutropenia  --possibly due to infection --monitor  Anxiety --atarax  PRN  Acute urinary retention.  Foley catheter was placed yesterday when she was unable to urinate and significant amount of urine on bladder scan. - Continue with Foley care catheter today-we will try giving her a voiding trial in a day or so.   DVT prophylaxis: Lovenox  SQ Code Status: DNR  Family Communication:  Level of care: Progressive Dispo:   The patient is from: SNF Anticipated d/c is to: SNF Anticipated d/c date is: 1-2 days   Subjective and Interval History:  Patient was  seen and examined today.  She was feeling weak. Per patient she was able to void  well before coming to the hospital.  A close friend at bedside.   Objective: Vitals:   03/21/24 0200 03/21/24 0252 03/21/24 0749 03/21/24 1502  BP: (!) 108/51 (!) 111/50 92/79 (!) 105/49  Pulse: (!) 55 (!) 51 (!) 52 70  Resp: 17 18 18 17   Temp: 97.9 F (36.6 C) (!) 97.4 F (36.3 C) (!) 97.4 F (36.3 C) 97.6 F (36.4 C)  TempSrc: Oral  Oral Oral  SpO2:  100% 100% 91%  Weight:      Height:        Intake/Output Summary (Last 24 hours) at 03/21/2024 1522 Last data filed at 03/21/2024 1146 Gross per 24 hour  Intake 320 ml  Output 675 ml  Net -355 ml   Filed Weights   03/19/24 0455  Weight: 45.6 kg    Examination:   General.  Frail and severely malnourished elderly lady, in no acute distress. Pulmonary.  Lungs clear bilaterally, normal respiratory effort. CV.  Regular rate and rhythm, no JVD, rub or murmur. Abdomen.  Soft, nontender, nondistended, BS positive. CNS.  Alert and oriented .  No focal neurologic deficit. Extremities.  No edema,  pulses intact and symmetrical.    Data Reviewed: I have personally reviewed labs and imaging studies  Time spent: 50 minutes  This record has been created using Conservation officer, historic buildings. Errors have been sought and corrected,but may not always be located. Such creation errors do not reflect on the standard of care.   Amaryllis Dare, MD [facility] If 7PM-7AM, please contact night-coverage 03/21/2024, 3:22 PM

## 2024-03-22 DIAGNOSIS — D709 Neutropenia, unspecified: Secondary | ICD-10-CM | POA: Diagnosis not present

## 2024-03-22 DIAGNOSIS — A419 Sepsis, unspecified organism: Secondary | ICD-10-CM | POA: Diagnosis not present

## 2024-03-22 DIAGNOSIS — J189 Pneumonia, unspecified organism: Secondary | ICD-10-CM | POA: Diagnosis not present

## 2024-03-22 DIAGNOSIS — Z9189 Other specified personal risk factors, not elsewhere classified: Secondary | ICD-10-CM | POA: Diagnosis not present

## 2024-03-22 LAB — FOLATE: Folate: 3.9 ng/mL — ABNORMAL LOW (ref >5.9)

## 2024-03-22 LAB — BLOOD GAS, ARTERIAL
Acid-Base Excess: 8 mmol/L — ABNORMAL HIGH (ref 0.0–2.0)
Bicarbonate: 27.8 mmol/L (ref 20.0–28.0)
O2 Content: 4 L/min
O2 Saturation: 98.9 %
Patient temperature: 37
pCO2 arterial: 22 mmHg — ABNORMAL LOW (ref 32–48)
pH, Arterial: 7.71 (ref 7.35–7.45)
pO2, Arterial: 87 mmHg (ref 83–108)

## 2024-03-22 LAB — CBC
HCT: 25 % — ABNORMAL LOW (ref 36.0–46.0)
Hemoglobin: 7.6 g/dL — ABNORMAL LOW (ref 12.0–15.0)
MCH: 27 pg (ref 26.0–34.0)
MCHC: 30.4 g/dL (ref 30.0–36.0)
MCV: 88.7 fL (ref 80.0–100.0)
Platelets: 674 K/uL — ABNORMAL HIGH (ref 150–400)
RBC: 2.82 MIL/uL — ABNORMAL LOW (ref 3.87–5.11)
RDW: 19.8 % — ABNORMAL HIGH (ref 11.5–15.5)
WBC: 4.1 K/uL (ref 4.0–10.5)
nRBC: 0 % (ref 0.0–0.2)

## 2024-03-22 LAB — BASIC METABOLIC PANEL WITH GFR
Anion gap: 8 (ref 5–15)
BUN: 26 mg/dL — ABNORMAL HIGH (ref 8–23)
CO2: 30 mmol/L (ref 22–32)
Calcium: 8.1 mg/dL — ABNORMAL LOW (ref 8.9–10.3)
Chloride: 103 mmol/L (ref 98–111)
Creatinine, Ser: 1.61 mg/dL — ABNORMAL HIGH (ref 0.44–1.00)
GFR, Estimated: 32 mL/min — ABNORMAL LOW (ref >60)
Glucose, Bld: 168 mg/dL — ABNORMAL HIGH (ref 70–99)
Potassium: 3.9 mmol/L (ref 3.5–5.1)
Sodium: 141 mmol/L (ref 135–145)

## 2024-03-22 LAB — RETICULOCYTES
Immature Retic Fract: 16.2 % — ABNORMAL HIGH (ref 2.3–15.9)
RBC.: 2.76 MIL/uL — ABNORMAL LOW (ref 3.87–5.11)
Retic Count, Absolute: 46.9 K/uL (ref 19.0–186.0)
Retic Ct Pct: 1.7 % (ref 0.4–3.1)

## 2024-03-22 LAB — VITAMIN B12: Vitamin B-12: 209 pg/mL (ref 180–914)

## 2024-03-22 LAB — FERRITIN: Ferritin: 142 ng/mL (ref 11–307)

## 2024-03-22 LAB — IRON AND TIBC
Iron: 55 ug/dL (ref 28–170)
Saturation Ratios: 29 % (ref 10.4–31.8)
TIBC: 192 ug/dL — ABNORMAL LOW (ref 250–450)
UIBC: 137 ug/dL

## 2024-03-22 LAB — GLUCOSE, CAPILLARY
Glucose-Capillary: 175 mg/dL — ABNORMAL HIGH (ref 70–99)
Glucose-Capillary: 99 mg/dL (ref 70–99)
Glucose-Capillary: 150 mg/dL — ABNORMAL HIGH (ref 70–99)
Glucose-Capillary: 298 mg/dL — ABNORMAL HIGH (ref 70–99)
Glucose-Capillary: 361 mg/dL — ABNORMAL HIGH (ref 70–99)

## 2024-03-22 MED ORDER — LACTATED RINGERS IV SOLN: INTRAVENOUS | Status: AC

## 2024-03-22 MED ORDER — SERTRALINE HCL 50 MG PO TABS
50.0000 mg | ORAL_TABLET | Freq: Every day | ORAL | Status: DC
Start: 2024-03-22 — End: 2024-03-25
  Administered 2024-03-22 – 2024-03-24 (×3): 50 mg via ORAL
  Filled 2024-03-22 (×3): qty 1

## 2024-03-22 MED ORDER — LATANOPROST 0.005 % OP SOLN
1.0000 [drp] | Freq: Every day | OPHTHALMIC | Status: DC
  Administered 2024-03-22 – 2024-03-24 (×3): 1 [drp] via OPHTHALMIC
  Filled 2024-03-22: qty 2.5

## 2024-03-22 MED ORDER — SPIRONOLACTONE 25 MG PO TABS
25.0000 mg | ORAL_TABLET | Freq: Every day | ORAL | Status: DC
  Administered 2024-03-22 – 2024-03-25 (×4): 25 mg via ORAL
  Filled 2024-03-22 (×4): qty 1

## 2024-03-22 MED ORDER — GABAPENTIN 250 MG/5ML PO SOLN
200.0000 mg | Freq: Two times a day (BID) | ORAL | Status: DC
  Administered 2024-03-22 – 2024-03-25 (×6): 200 mg via ORAL
  Filled 2024-03-22 (×11): qty 4

## 2024-03-22 MED ORDER — FAMOTIDINE 20 MG PO TABS
20.0000 mg | ORAL_TABLET | Freq: Every day | ORAL | Status: DC
  Administered 2024-03-22 – 2024-03-25 (×4): 20 mg via ORAL
  Filled 2024-03-22 (×4): qty 1

## 2024-03-22 NOTE — Progress Notes (Signed)
 Speech Language Pathology Treatment: Dysphagia  Patient Details Name: Wanda Monroe MRN: [id number] DOB: Sep 09, 1941 Today's Date: 03/22/2024 Time: 8464-8379 SLP Time Calculation (min) (ACUTE ONLY): 45 min  Assessment / Plan / Recommendation Clinical Impression  Pt seen for ongoing assessment of swallowing. Pt awake, verbal but w/ confusion noted in some responses- she tended to repeat herself often and perseverated on wanting to go home today. She followed general instructions appropriately given cues. She fed herself. Adriana, friend, arrived at end of session.  On RA, afebrile.   Pt explained general aspiration precautions and agreed verbally to the need for following them especially sitting upright for all oral intake and taking Small bites/sips Slowly- supported behind the back in the chair for more upright sitting. She fed herself trials of mech soft food(soft solid), purees and thin liquids via cup/straw post Setup given. No overt clinical s/s of aspiration were noted w/ any consistency; respiratory status remained calm and unlabored, vocal quality clear b/t trials. No overt coughing. Oral phase appeared [facility] for bolus management, mastication, and timely A-P transfer for swallowing; oral clearing achieved w/ all consistencies. No c/o difficulty swallowing Nor any oral discomfort- no mouth pain reported by pt during the trials. NSG denied any deficits in swallowing of the full liquid diet so far today; pills in puree.   Pt's oral discomfort appears improved; she appears at reduced risk for aspiration when following general aspiration precautions. Recommend more mech soft diet for ease of soft foods w/ gravies added to moisten foods; Thin liquids. Recommend general aspiration precautions including Reducing Distractions at meals; Pills CRUSHED vs Whole in Puree; tray setup and positioning assistance for meals. REFLUX precautions d/t pt's baseline of Hiatal Hernia.  Recommend monitoring for oral  ulcers/soreness in the future and treat Promptly to avoid pt avoiding oral intake. ST services will sign off at this time w/ MD to reconsult if needed while admitted. MD/NSG updated. Precautions posted at bedside, chart. Recommend Dietician f/u for support; Palliative Care f/u for GOC.     HPI HPI: Pt is a 83 y.o. 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  She presented from her facility with respiratory distress, lethargy and confusion from baseline.  She was hypoxic to the 70s with EMS and placed on NRB for transport, transitioning to BiPAP on arrival and subsequently HFNC briefly by the time of admission.  Chest x-ray showing streaky atelectasis or minimal infiltrate left base and a moderate hiatal hernia  Patient treated with sepsis fluids and started on broad-spectrum antibiotics of vancomycin  and cefepime  and Flagyl .  Per MD note, pt is Neutropenia with fever; at risk for severe infection due to chronic immunosuppressive therapy.  Previous CT of ABD: Large hiatal hernia.      SLP Plan  All goals met          Recommendations  Diet recommendations: Dysphagia 3 (mechanical soft);Thin liquid (gravies) Liquids provided via: Cup;Straw Medication Administration: Crushed with puree Supervision: Patient able to self feed;Intermittent supervision to cue for compensatory strategies (setup) Compensations: Minimize environmental distractions;Slow rate;Small sips/bites;Lingual sweep for clearance of pocketing;Follow solids with liquid Postural Changes and/or Swallow Maneuvers: Out of bed for meals;Seated upright 90 degrees;Upright 30-60 min after  meal                  (Dietician; Palliative Care f/u) Oral care BID;Staff/trained caregiver to provide oral care ([facility] care)   Intermittent Supervision/Assistance Dysphagia, unspecified (R13.10) (oral erythema; FTT presentation; Dementia)     All goals met       Comer Portugal, MS, CCC-SLP Speech Language Pathologist Rehab Services; [facility] [phone] (ascom) [place]o  03/22/2024, 4:32 PM

## 2024-03-22 NOTE — Progress Notes (Signed)
 PROGRESS NOTE    Wanda Monroe  FMW:[id number] DOB: 10-15-1940 DOA: 03/18/2024 PCP: [facility]  [id number]  LOS: 4 days   Brief hospital course:  Wanda Monroe is a 83 y.o. 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    She presented from her facility with respiratory distress, lethargy and confusion from baseline.  She was hypoxic to the 70s with EMS and placed on NRB for transport, transitioning to BiPAP on arrival and subsequently HFNC by the time of admission. Tmax in the ED was 105 (rectal) and patient was tachycardic to 132 with respiratory rate up to 31.  O2 sats were in the high 90s on BiPAP.    7/10: Vital stable, mild hypokalemia which is being repleted.  Hemoglobin at 7.3, checking anemia panel and starting on supplement.  Slight increase in creatinine to 1.46-patient did receive a dose of IV Lasix  yesterday.  PT and OT evaluation ordered.  7/11: Hemodynamically stable, hypokalemia resolved, increasing creatinine now at 1.61.  Giving some gentle IV fluid Hemoglobin at 7.6 and anemia panel with low TIBC and folate, B12 pending.  She was started on supplement. Palliative care care was also consulted-apparently patient was with hospice before and later revoked.  Assessment & Plan:   * Sepsis due to pneumonia (HCC) Sepsis criteria include fever, tachycardia, tachypnea. --source presumed PNA.  CXR didn't show obvious consolidation, however, procal elevated at 5.64.  Received Broad-spectrum antibiotics of cefepime  vancomycin  and Flagyl  on presentation. --cont ceftriaxone  and azithromycin -will complete a 5-day course  Acute on chronic respiratory failure with hypoxia  (HCC) COPD on 2L baseline Patient presented with acute respiratory distress, hypoxic to the 70s with EMS, NRB-> BiPAP-> HFNC Secondary to suspected pneumonia No overt wheezing so exacerbation of COPD not suspected --down to 2L O2 today, which is her baseline.  Patient  Hypokalemia Hypomag Potassium of 3.1 with magnesium  of 1.9 today - Replete potassium and monitor  Acute metabolic encephalopathy Dementia without behavioral disturbance Depression Patient presented with increased confusion from baseline for dementia, likely related to acute infection --delirium precaution   Hypotension --received boluses f/b MIVF.  started on solu-cortef  on admission.  --cont solu-cortef  -starting a quick taper --cont midodrine  (new)  History of hemorrhagic CVA (cerebrovascular accident) No acute issues suspected  Chronic kidney disease, stage 3a (HCC) Anion gap metabolic acidosis Patient developed AKI with increasing creatinine now at 1.61. Metabolic acidosis resolved - Giving some gentle IV fluid - Monitor renal function  HTN (hypertension), currently not active Hold antihypertensives   Diabetes mellitus without complication (HCC) Hyperglycemia due to steroid use --ACHS and SSI for now  Normocytic anemia. - Slowly decreasing hemoglobin without any obvious bleeding.  At 7.6 today.  Anemia panel consistent with anemia of chronic disease and folate deficiency, B12 pending - Starting her on supplement - Monitor hemoglobin - Transfuse if below 7  Frailty History of dementia, recent fall in May 2025 with multiple fractures Previously on hospice On chronic immunosuppressive therapy now with severe of infection High risk of mortality in the next 6 months PT and OT -recommending SNF Palliative care was also consulted  Rheumatoid arthritis (HCC) Holding immunosuppressive therapy --hold home oxycodone  due to intermittent hypotension  Neutropenia   --Improved --monitor  Anxiety --  atarax  PRN  Acute urinary retention.  Foley catheter was placed yesterday when she was unable to urinate and significant amount of urine on bladder scan. - Continue with Foley care catheter -we will try giving her a voiding trial in a day or so.   DVT prophylaxis: Lovenox  SQ Code Status: DNR  Family Communication: Talked with Wanda Monroe, her POA on phone. Level of care: MedSurg Dispo:   The patient is from: SNF Anticipated d/c is to: SNF Anticipated d/c date is: Medically stable for SNF   Subjective and Interval History:  Patient was still feeling little weak but wants to go home. We discussed about going to a rehab before returning home.  She was asking me to talk with Wanda Monroe.   Objective: Vitals:   03/22/24 0320 03/22/24 0820 03/22/24 1130 03/22/24 1216  BP: (!) 116/51 99/79 (!) 119/50 (!) 116/50  Pulse: (!) 51 (!) 57 72 72  Resp: 20 16    Temp: (!) 97.4 F (36.3 C)     TempSrc: Oral     SpO2: 100% 100%  100%  Weight:      Height:        Intake/Output Summary (Last 24 hours) at 03/22/2024 1457 Last data filed at 03/22/2024 1440 Gross per 24 hour  Intake 2628.74 ml  Output 360 ml  Net 2268.74 ml   Filed Weights   03/19/24 0455  Weight: 45.6 kg    Examination:   General.  Frail and severely malnourished elderly lady, in no acute distress. Pulmonary.  Lungs clear bilaterally, normal respiratory effort. CV.  Regular rate and rhythm, no JVD, rub or murmur. Abdomen.  Soft, nontender, nondistended, BS positive. CNS.  Alert and oriented .  No focal neurologic deficit. Extremities.  No edema, no cyanosis, pulses intact and symmetrical. Psychiatry.  Judgment and insight appears normal.     Data Reviewed: I have personally reviewed labs and imaging studies  Time spent: 45 minutes  This record has been created using Conservation officer, historic buildings. Errors have been sought and corrected,but may not always be located. Such creation  errors do not reflect on the standard of care.   Wanda Dare, MD [facility] If 7PM-7AM, please contact night-coverage 03/22/2024, 2:57 PM

## 2024-03-22 NOTE — TOC Progression Note (Addendum)
 Transition of Care [facility]) - Progression Note    Patient Details  Name: Wanda Monroe MRN: [id number] Date of Birth: [birth year]  Transition of Care [facility]) CM/SW Contact  Racheal LITTIE Schimke, RN Phone Number: 03/22/2024, 12:42 PM  Clinical Narrative:  Called and spoke with neighbor/POA Adriana Rams, [phone] who says patient lives alone, confined to bed for last 3-4 weeks only getting up to [facility]. Nephrew lives there sometimes, however he's an Alcoholic, no other family noted.  3:38 pm: SNF process started, POA declines [facility].  Expected Discharge Plan and Services                                               Social Determinants of Health (SDOH) Interventions SDOH Screenings   Food Insecurity: Patient Unable To Answer (03/19/2024)  Recent Concern: Food Insecurity - Food Insecurity Present (01/19/2024)  Housing: Unknown (02/21/2024)   Received from [facility]  Transportation Needs: No Transportation Needs (01/19/2024)  Utilities: Not At Risk (01/19/2024)  Social Connections: Socially Isolated (01/19/2024)  Tobacco Use: Low Risk  (03/18/2024)    Readmission Risk Interventions     No data to display

## 2024-03-22 NOTE — NC FL2 (Signed)
 [place]  MEDICAID FL2 LEVEL OF CARE FORM     IDENTIFICATION  Patient Name: Wanda Monroe Birthdate: 1941/03/19 Sex: female Admission Date (Current Location): 03/18/2024  [place] and [place] Number:  Chiropodist and Address:  [facility], [address] [place], [place] [address]      Provider Number: [id number]  Attending Physician Name and Address:  Caleen Qualia, MD  Relative Name and Phone Number:       Current Level of Care:   Recommended Level of Care:   Prior Approval Number: Adriana Jurline Boards [phone]  Date Approved/Denied:   PASRR Number: [id number]  Discharge Plan: SNF    Current Diagnoses: Patient Active Problem List   Diagnosis Date Noted   Protein-calorie malnutrition, severe 03/20/2024   Hypokalemia 03/19/2024   Sepsis due to pneumonia (HCC) 03/18/2024   Neutropenic fever (HCC) 03/18/2024   At risk for severe infection due to immunosuppression 03/18/2024   History of hemorrhagic CVA (cerebrovascular accident) 03/18/2024   Chronic kidney disease, stage 3a (HCC) 03/18/2024   Dementia without behavioral disturbance (HCC) 03/18/2024   Frailty 03/18/2024   Acute on chronic respiratory failure with hypoxia (HCC) 03/18/2024   Acute metabolic encephalopathy 03/18/2024   Chronic obstructive pulmonary disease (HCC) 01/29/2024   Thoracic compression fracture (HCC) 01/18/2024   Acute postoperative anemia due to expected blood loss 05/22/2023   GERD without esophagitis 05/21/2023   Hypertensive urgency 05/21/2023   Hospice care patient 05/21/2023   Closed right hip fracture (HCC) 05/20/2023   Dyslipidemia 05/20/2023   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) 05/20/2023   Rheumatoid arthritis (HCC) 05/20/2023   Aspiration pneumonia (HCC) 04/11/2022   Protein calorie malnutrition (HCC) 04/11/2022   Depression 04/11/2022   Cellulitis of foot 06/16/2021   Colon polyp 06/16/2021    Hiatal hernia with GERD 06/16/2021   Hemorrhagic cerebrovascular accident (CVA) (HCC) 06/16/2021   Osteoarthritis 06/16/2021   Osteoporosis, 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/16/2021   Rheumatoid arthritis involving multiple sites with positive rheumatoid factor (HCC) 06/16/2021   Rosacea 06/16/2021   Tricuspid regurgitation 04/22/2021   Symptomatic anemia 04/10/2014   S/P lumbar laminectomy 06/12/2013   Lumbar stenosis 04/18/2013   HTN (hypertension) 07/23/2012   Diabetes mellitus without complication (HCC) 07/23/2012   Hyperlipidemia 01/03/2012   Stroke (HCC) 01/03/2012   Temporal arteritis (HCC) 01/03/2012    Orientation RESPIRATION BLADDER Height & Weight     Self, Time  Normal External catheter Weight: 45.6 kg (From Bed Scale per ED RN) Height:  5' 7 (170.2 cm)  BEHAVIORAL SYMPTOMS/MOOD NEUROLOGICAL BOWEL NUTRITION STATUS      Incontinent Diet  AMBULATORY STATUS COMMUNICATION OF NEEDS Skin   Extensive Assist Verbally Other (Comment) (Redness/ buttocks)                       Personal Care Assistance Level of Assistance  Bathing, Feeding, Dressing Bathing Assistance: Limited assistance Feeding assistance: Limited assistance Dressing Assistance: Limited assistance     Functional Limitations Info  Sight, Hearing Sight Info: Impaired (Glasses) Hearing Info: Impaired      SPECIAL CARE FACTORS FREQUENCY  PT (By licensed PT), OT (By licensed OT)     PT Frequency: 5x week OT Frequency: 5x week            Contractures Contractures Info: Present (LUE)    Additional Factors Info  Code Status, Allergies Code Status Info: DNR LimitedNo CPR or chest compressions.  In Pre-Arrest Conditions (Patient Is Breathing and  Has A Pulse) Do not intubate. Provide all appropriate non-invasive medical interventions. Avoid ICU transfer unless indicated or required. Allergies Info: Codeine Sulfate Not Specified  Nausea Only   Remicade (infliximab) infection           Current Medications  (03/22/2024):  This is the current hospital active medication list Current Facility-Administered Medications  Medication Dose Route Frequency Provider Last Rate Last Admin   acetaminophen  (TYLENOL ) tablet 650 mg  650 mg Oral Q6H PRN Duncan, Hazel V, MD   650 mg at 03/19/24 2128   Or   acetaminophen  (TYLENOL ) suppository 650 mg  650 mg Rectal Q6H PRN Duncan, Hazel V, MD       atorvastatin  (LIPITOR ) tablet 80 mg  80 mg Oral q1800 Cleatus Delayne GAILS, MD   80 mg at 03/21/24 1844   azithromycin  (ZITHROMAX ) tablet 500 mg  500 mg Oral Daily Awanda City, MD   500 mg at 03/22/24 1154   B-complex with vitamin C tablet 1 tablet  1 tablet Oral Daily Awanda City, MD   1 tablet at 03/22/24 1154   cefTRIAXone  (ROCEPHIN ) 2 g in sodium chloride  0.9 % 100 mL IVPB  2 g Intravenous Q24H Merrill, Kristin A, RPH   Stopped at 03/21/24 1702   Chlorhexidine  Gluconate Cloth 2 % PADS 6 each  6 each Topical Daily Awanda City, MD   6 each at 03/21/24 1000   enoxaparin  (LOVENOX ) injection 30 mg  30 mg Subcutaneous Q24H Dail Rankin RAMAN, RPH   30 mg at 03/22/24 9047   famotidine  (PEPCID ) tablet 20 mg  20 mg Oral Daily Amin, Sumayya, MD       Fe Fum-Vit C-Vit B12-FA (TRIGELS-F FORTE) capsule 1 capsule  1 capsule Oral BID Amin, Sumayya, MD   1 capsule at 03/22/24 1155   feeding supplement (ENSURE PLUS HIGH PROTEIN) liquid 237 mL  237 mL Oral TID BM Awanda City, MD   237 mL at 03/22/24 1444   gabapentin  (NEURONTIN ) 250 MG/5ML solution 200 mg  200 mg Oral BID Amin, Sumayya, MD       hydrOXYzine  (ATARAX ) tablet 50 mg  50 mg Oral TID PRN Awanda City, MD   50 mg at 03/22/24 0155   insulin  aspart (novoLOG ) injection 0-9 Units  0-9 Units Subcutaneous Q4H Cleatus Delayne V, MD   1 Units at 03/22/24 1155   ipratropium-albuterol  (DUONEB) 0.5-2.5 (3) MG/3ML nebulizer solution 3 mL  3 mL Nebulization Q6H PRN Cleatus Delayne GAILS, MD   3 mL at 03/20/24 9263   lactated ringers  infusion   Intravenous Continuous Amin, Sumayya, MD 100 mL/hr at 03/22/24 1440 New Bag at  03/22/24 1440   lactated ringers  infusion   Intravenous Continuous Amin, Sumayya, MD       latanoprost  (XALATAN ) 0.005 % ophthalmic solution 1 drop  1 drop Both Eyes QHS Amin, Sumayya, MD       loperamide  (IMODIUM ) capsule 2 mg  2 mg Oral PRN Amin, Sumayya, MD   2 mg at 03/21/24 1844   magic mouthwash  15 mL Oral QID Awanda City, MD   15 mL at 03/22/24 1156   midodrine  (PROAMATINE ) tablet 10 mg  10 mg Oral TID WC Awanda City, MD   10 mg at 03/22/24 1439   morphine  (PF) 2 MG/ML injection 1 mg  1 mg Intravenous Q4H PRN Awanda City, MD   1 mg at 03/21/24 2256   multivitamin with minerals tablet 1 tablet  1 tablet Oral Daily Awanda City, MD  1 tablet at 03/22/24 1155   ondansetron  (ZOFRAN ) tablet 4 mg  4 mg Oral Q6H PRN Duncan, Hazel V, MD       Or   ondansetron  (ZOFRAN ) injection 4 mg  4 mg Intravenous Q6H PRN Duncan, Hazel V, MD   4 mg at 03/20/24 0412   [START ON 03/23/2024] predniSONE  (DELTASONE ) tablet 40 mg  40 mg Oral Q breakfast Amin, Sumayya, MD       Followed by   NOREEN ON 03/24/2024] predniSONE  (DELTASONE ) tablet 30 mg  30 mg Oral Q breakfast Amin, Sumayya, MD       Followed by   NOREEN ON 03/25/2024] predniSONE  (DELTASONE ) tablet 20 mg  20 mg Oral Q breakfast Amin, Sumayya, MD       Followed by   NOREEN ON 03/26/2024] predniSONE  (DELTASONE ) tablet 10 mg  10 mg Oral Q breakfast Amin, Sumayya, MD       sertraline  (ZOLOFT ) tablet 50 mg  50 mg Oral QHS Amin, Sumayya, MD       spironolactone  (ALDACTONE ) tablet 25 mg  25 mg Oral Daily Amin, Sumayya, MD   25 mg at 03/22/24 1439     Discharge Medications: Please see discharge summary for a list of discharge medications.  Relevant Imaging Results:  Relevant Lab Results:   Additional Information SS# [id number]  Racheal LITTIE Schimke, RN

## 2024-03-22 NOTE — Plan of Care (Signed)
  Problem: Coping: Goal: Level of anxiety will decrease Outcome: Progressing   Problem: Clinical Measurements: Goal: Signs and symptoms of infection will decrease Outcome: Progressing   Problem: Respiratory: Goal: Ability to maintain adequate ventilation will improve Outcome: Progressing

## 2024-03-22 NOTE — Evaluation (Signed)
 Occupational Therapy Evaluation Patient Details Name: Wanda Monroe MRN: [id number] DOB: 1940-11-18 Today's Date: 03/22/2024   History of Present Illness   Pt is an 83 y.o. 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     Clinical Impressions Pt was seen for OT evaluation this date. Prior to hospital admission, reports she lives at home alone with a nephew who stays overnight occasionally but does not contribute to her care. Patient is an inconsistent historian who will perseverate on topics however CM was able to confirm that patient lives alone, confined to bed for last 3-4 weeks only getting up to [facility]. Nephrew lives there sometimes but does not provide support. [name] Pt presents to acute OT demonstrating impaired ADL performance and functional mobility 2/2 functional strength, balance, and activity tolerance (See OT problem list for additional functional deficits) which negatively impact her ability to safely and proficiently engage in ADLs at PLOF. Pt currently requires Mod/Max for LB ADLs, Min A for UB ADLs, Min A to initiate sit to stand transfers and CGA at RW level to transition with Step pivot transfers.  Pt currently has cast to LUE, however there is no further information at the time of this evaluation).  Pt would benefit from skilled OT services to address noted impairments and functional limitations (see below for any additional details) in order to maximize safety and independence while minimizing falls risk and caregiver burden.        If plan is discharge home, recommend the following:   A lot of help with bathing/dressing/bathroom;A little help with walking and/or transfers;Assist for  transportation;Help with stairs or ramp for entrance;Assistance with cooking/housework;Direct supervision/assist for medications management     Functional Status Assessment   Patient has had a recent decline in their functional status and demonstrates the ability to make significant improvements in function in a reasonable and predictable amount of time.     Equipment Recommendations         Recommendations for Other Services         Precautions/Restrictions   Precautions Precautions: Fall;Other (comment) (has cast to LUE (wrist and forearm)) Precaution/Restrictions Comments: unclear regarding LUE cast (MD unable to confirm WB status at bedside visit) Restrictions Other Position/Activity Restrictions: Unknown for LUE     Mobility Bed Mobility Overal bed mobility: Needs Assistance Bed Mobility: Supine to Sit     Supine to sit: Min assist, HOB elevated, Used rails          Transfers Overall transfer level: Needs assistance Equipment used: Rolling walker (2 wheels) Transfers: Sit to/from Stand Sit to Stand: Mod assist                  Balance Overall balance assessment: Needs assistance Sitting-balance support: Feet supported Sitting balance-Leahy Scale: Good     Standing balance support: During functional activity, Bilateral upper extremity supported, Reliant on assistive device for balance Standing balance-Leahy Scale: Fair                             ADL either performed or assessed with clinical judgement   ADL Overall ADL's : Needs assistance/impaired     Grooming: Wash/dry face;Wash/dry hands;Supervision/safety;Set up Grooming Details (  indicate cue type and reason): seated Upper Body Bathing: Minimal assistance   Lower Body Bathing: Maximal assistance;Moderate assistance   Upper Body Dressing : Minimal assistance   Lower Body Dressing: Maximal assistance   Toilet Transfer: Minimal assistance;Stand-pivot;Cueing for  sequencing;Cueing for safety Toilet Transfer Details (indicate cue type and reason): simulated Toileting- Clothing Manipulation and Hygiene: Maximal assistance       Functional mobility during ADLs: Contact guard assist;Rolling walker (2 wheels)       Vision Baseline Vision/History: 0 No visual deficits Patient Visual Report: No change from baseline       Perception         Praxis         Pertinent Vitals/Pain Pain Assessment Pain Assessment: No/denies pain     Extremity/Trunk Assessment Upper Extremity Assessment Upper Extremity Assessment: Generalized weakness           Communication Communication Communication: No apparent difficulties   Cognition Arousal: Alert Behavior During Therapy: WFL for tasks assessed/performed Cognition: History of cognitive impairments, No family/caregiver present to determine baseline, Cognition impaired   Orientation impairments: Person, Place                           Following commands: Intact       Cueing  General Comments   Cueing Techniques: Verbal cues;Gestural cues;Tactile cues;Visual cues      Exercises Other Exercises Other Exercises: education on role of OT, education on functional transfers and use of DME in small spaced to reduce risk of LOB   Shoulder Instructions      Home Living Family/patient expects to be discharged to:: Unsure (Patient is an inconsistent historian.  Reports she lives at home alone with a nephew who stays overnight however has a neighbor Adriana who provides support as needed.  Per H&P, patient admitted to Acute from a facility.) Living Arrangements: Other (Comment) (Patient is an inconsistent historian. Reports she lives at home alone with a nephew who stays overnight however has a neighbor Adriana who provides support as needed. Per H&P, patient admitted to Acute from a facility.) Available Help at Discharge: Other (Comment) (Patient is an inconsistent historian. Reports  she lives at home alone with a nephew who stays overnight however has a neighbor Adriana who provides support as needed. Per H&P, patient admitted to Acute from a facility.) Type of Home: Other(Comment) (Patient is an inconsistent historian. Reports she lives at home alone with a nephew who stays overnight however has a neighbor Adriana who provides support as needed. Per H&P, patient admitted to Acute from a facility.)       Home Layout: Other (Comment) (Patient is an inconsistent historian. Reports she lives at home alone with a nephew who stays overnight however has a neighbor Adriana who provides support as needed. Per H&P, patient admitted to Acute from a facility.)                   Additional Comments: Patient is an inconsistent historian. Reports she lives at home alone with a nephew who stays overnight however has a neighbor Adriana who provides support as needed. Per H&P, patient admitted to Acute from a facility.      Prior Functioning/Environment Prior Level of Function : Needs assist             Mobility Comments: pt unable to give clear PLOF but per chart ambulatory with walker, lives alone but has had falls ADLs Comments: PLOF is  unclear as patient is an inconsistent historian.  Awaiting clarification from SW/CM regarding PLOF and Prior Living Situation    OT Problem List: Decreased strength;Decreased activity tolerance;Decreased safety awareness;Impaired balance (sitting and/or standing);Impaired UE functional use   OT Treatment/Interventions: Self-care/ADL training;Therapeutic exercise;Therapeutic activities;DME and/or AE instruction;Patient/family education;Balance training      OT Goals(Current goals can be found in the care plan section)   Acute Rehab OT Goals Patient Stated Goal: Return home OT Goal Formulation: With patient Time For Goal Achievement: 04/05/24 Potential to Achieve Goals: Good   OT Frequency:  Min 2X/week    Co-evaluation               AM-PAC OT 6 Clicks Daily Activity     Outcome Measure Help from another person eating meals?: None Help from another person taking care of personal grooming?: A Little Help from another person toileting, which includes using toliet, bedpan, or urinal?: A Lot Help from another person bathing (including washing, rinsing, drying)?: A Lot Help from another person to put on and taking off regular upper body clothing?: A Little Help from another person to put on and taking off regular lower body clothing?: A Lot 6 Click Score: 16   End of Session Equipment Utilized During Treatment: Gait belt;Rolling walker (2 wheels) Nurse Communication: Mobility status  Activity Tolerance: Patient tolerated treatment well Patient left: in chair;with call bell/phone within reach;with chair alarm set;with nursing/sitter in room  OT Visit Diagnosis: Unsteadiness on feet (R26.81);Repeated falls (R29.6);Muscle weakness (generalized) (M62.81)                Time: 8882-8794 OT Time Calculation (min): 48 min Charges:  OT General Charges $OT Visit: 1 Visit OT Evaluation $OT Eval Low Complexity: 1 Low  Ryland Smoots OTR/L   Harlene LITTIE Sharps 03/22/2024, 12:39 PM

## 2024-03-22 NOTE — Consult Note (Signed)
 PHARMACY CONSULT NOTE - ELECTROLYTES  Pharmacy Consult for Electrolyte Monitoring and Replacement   Recent Labs: Height: 5' 7 (170.2 cm) Weight: 45.6 kg (100 lb 8.5 oz) (From Bed Scale per ED RN) IBW/kg (Calculated) : 61.6 Estimated Creatinine Clearance: 19.7 mL/min (A) (by C-G formula based on SCr of 1.61 mg/dL (H)). Potassium (mmol/L)  Date Value  03/22/2024 3.9  10/07/2012 4.1   Magnesium  (mg/dL)  Date Value  92/89/7974 1.9  10/02/2012 2.0   Calcium  (mg/dL)  Date Value  92/88/7974 8.1 (L)   Calcium , Total (mg/dL)  Date Value  98/73/7985 8.2 (L)   Albumin (g/dL)  Date Value  92/91/7974 2.4 (L)   Phosphorus (mg/dL)  Date Value  92/91/7974 2.9   Sodium (mmol/L)  Date Value  03/22/2024 141  10/07/2012 142   Corrected Ca: 9.48 mg/dL  Assessment  Wanda Monroe is a 83 y.o. female presenting with respiratory distress. 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 Pharmacy has been consulted to monitor and replace electrolytes.  Diet: full liquid,   per Speech 7/8: Please give Pills CRUSHED in Puree for safer, easier oral phase swallowing.  MIVF:none Pertinent medications: lasix  40 mg IV given 7/9 am and pm  Goal of Therapy: Electrolytes WNL  Plan:  No electrolyte replacement at this time Check electrolytes with AM labs  Thank you for allowing pharmacy to be a part of this patient's care.  Suzann Allean LABOR, PharmD Clinical Pharmacist 03/22/2024 8:01 AM

## 2024-03-23 DIAGNOSIS — A419 Sepsis, unspecified organism: Secondary | ICD-10-CM | POA: Diagnosis not present

## 2024-03-23 DIAGNOSIS — Z7189 Other specified counseling: Secondary | ICD-10-CM | POA: Diagnosis not present

## 2024-03-23 DIAGNOSIS — Z789 Other specified health status: Secondary | ICD-10-CM | POA: Diagnosis not present

## 2024-03-23 DIAGNOSIS — Z9189 Other specified personal risk factors, not elsewhere classified: Secondary | ICD-10-CM | POA: Diagnosis not present

## 2024-03-23 DIAGNOSIS — Z515 Encounter for palliative care: Secondary | ICD-10-CM | POA: Diagnosis not present

## 2024-03-23 DIAGNOSIS — J189 Pneumonia, unspecified organism: Secondary | ICD-10-CM | POA: Diagnosis not present

## 2024-03-23 DIAGNOSIS — D709 Neutropenia, unspecified: Secondary | ICD-10-CM | POA: Diagnosis not present

## 2024-03-23 LAB — CBC
HCT: 25.3 % — ABNORMAL LOW (ref 36.0–46.0)
Hemoglobin: 7.6 g/dL — ABNORMAL LOW (ref 12.0–15.0)
MCH: 27.1 pg (ref 26.0–34.0)
MCHC: 30 g/dL (ref 30.0–36.0)
MCV: 90.4 fL (ref 80.0–100.0)
Platelets: 629 K/uL — ABNORMAL HIGH (ref 150–400)
RBC: 2.8 MIL/uL — ABNORMAL LOW (ref 3.87–5.11)
RDW: 19.9 % — ABNORMAL HIGH (ref 11.5–15.5)
WBC: 5.8 K/uL (ref 4.0–10.5)
nRBC: 0.3 % — ABNORMAL HIGH (ref 0.0–0.2)

## 2024-03-23 LAB — BASIC METABOLIC PANEL WITH GFR
Anion gap: 6 (ref 5–15)
BUN: 28 mg/dL — ABNORMAL HIGH (ref 8–23)
CO2: 28 mmol/L (ref 22–32)
Calcium: 8 mg/dL — ABNORMAL LOW (ref 8.9–10.3)
Chloride: 105 mmol/L (ref 98–111)
Creatinine, Ser: 1.31 mg/dL — ABNORMAL HIGH (ref 0.44–1.00)
GFR, Estimated: 41 mL/min — ABNORMAL LOW (ref >60)
Glucose, Bld: 123 mg/dL — ABNORMAL HIGH (ref 70–99)
Potassium: 3.9 mmol/L (ref 3.5–5.1)
Sodium: 139 mmol/L (ref 135–145)

## 2024-03-23 LAB — CULTURE, BLOOD (ROUTINE X 2)
Culture: NO GROWTH
Special Requests: ADEQUATE

## 2024-03-23 LAB — GLUCOSE, CAPILLARY
Glucose-Capillary: 130 mg/dL — ABNORMAL HIGH (ref 70–99)
Glucose-Capillary: 137 mg/dL — ABNORMAL HIGH (ref 70–99)
Glucose-Capillary: 155 mg/dL — ABNORMAL HIGH (ref 70–99)
Glucose-Capillary: 189 mg/dL — ABNORMAL HIGH (ref 70–99)
Glucose-Capillary: 257 mg/dL — ABNORMAL HIGH (ref 70–99)
Glucose-Capillary: 274 mg/dL — ABNORMAL HIGH (ref 70–99)

## 2024-03-23 NOTE — Plan of Care (Signed)
  Problem: Nutritional: Goal: Maintenance of adequate nutrition will improve Outcome: Progressing   Problem: Activity: Goal: Risk for activity intolerance will decrease Outcome: Progressing   Problem: Coping: Goal: Level of anxiety will decrease Outcome: Progressing   Problem: Pain Managment: Goal: General experience of comfort will improve and/or be controlled Outcome: Progressing   Problem: Safety: Goal: Ability to remain free from injury will improve Outcome: Progressing

## 2024-03-23 NOTE — Consult Note (Addendum)
 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 Presented to ED 03/18/2024 from home c/o respiratory distress, lethargy and confusion. EMS reported on scene, patient has SPO2 in the 70's. She was placed on NRB and trsnported. She required transition to BiPAP on arrival to ED.   ED workup found patient to be febrile at 105 (rectal), tachycardic 132 and RR 32. SpO2 was 90's on BiPAP. She was subsequently transitioned to HFNC.   ED labs significant for Na+ 133, K+ 2.9, Chloride 87, Glucose 143, creatinine 1.37, Ca++ 8.8, albumin 3.2, GFR 39, WBC 1.0, Hgb 9.5.  UA with rare bacteria. CXR showed streaky atelectasis or minimal infiltrate left base. No pleural effusion. Stable cardiomediastinal silhouette. Moderate hiatal hernia. Aortic atherosclerosis.  Patient was admitted to [facility] for management of sepsis d/t PNA, acute on chronic respiratory failure with hypoxia, hypokalemia and hypotension.   Clinical Assessment and Goals of Care: Extensive chart review completed prior to meeting patient including labs, vital signs, imaging, progress notes, orders, and available advanced directive documents from current and previous encounters. I then met with patient at the bedside  to discuss diagnosis prognosis, GOC, EOL wishes, disposition and options.  I introduced Palliative Medicine as specialized medical care for people living with serious illness. It focuses on providing relief from the symptoms and stress of a serious illness. The goal is to improve quality of life for  both the patient and the family.  Pleasant, elderly female resting in bed with sister-in-law at bedside. She is alert to self, time, place and situation. However, she does not remember how she got here. Patient proves to be a poor historian throughout visit. She has moments of confusion and rambling conversation and provides inconsistent information.  Patient's sister-in-law at bedside.  She endorses weakness today and chronic back pain. She reports sleeping well last night.  She ate approximately 25% of her breakfast.  She denies chest pain but endorses SOB that is exacerbated with talking.  Patient asked several times during visit to go home.  We discussed a brief life review of the patient. Ms. Nomura shares that she has been married twice. She was married to her last husband for >30 years. She never had children. She worked at [employer] and retired after approximately 40 years of service.  As far as functional and nutritional status, she mostly stays alone with the exception of her nephew, Ozell, staying at night. She uses a cane or walker to ambulate in her home.  She is no longer able to take a bath or shower and reports sponge bath and wiping down approximately once a week.  Patient endorses previous hospice in her home but was  transitioned to palliative care.  Per note, patient is currently followed by [facility] home-based primary care and home health program.    Her neighbor and caretaker, Adriana Rams, helps her with groceries with taking her to store or shopping for her. She reports ability to make a sandwich or pop a bag of popcorn if she gets hungry, but no longer cooks. She endorses decrease in appetite with associated significant weight loss over unspecified time period.  She is interested in [employer].  We discussed patient's current illness and what it means in the larger context of patient's on-going co-morbidities.  Natural disease trajectory and expectations  at EOL were discussed.  I attempted to elicit values and goals of care important to the patient.  The patient's current goal is to be able to go back home.  The difference between aggressive medical intervention and comfort care was considered in light of the patient's goals of care.   Advance directives, concepts specific to code status, artificial feeding and hydration, and rehospitalization were considered and discussed.  Patient confirms that she does not want CPR or to be placed on ventilator. She is a DNR.  Education offered regarding concept specific to human mortality and the limitations of medical interventions to prolong life when the body begins to fail to thrive.  Family is facing treatment option decisions, advanced directive, and anticipatory care needs.  Patient confirms in the event she is unable to make decisions for herself her caretaker and POA Adriana Rams is her surrogate Management consultant.  She does not offer a Counsellor.   Discussed with patient/family the importance of continued conversation with family and the medical providers regarding overall plan of care and treatment options, ensuring decisions are within the context of the patient's values and GOCs.    Hospice and Palliative Care services outpatient were explained and offered.  [facility] to follow patient through discharge.  Questions and concerns were addressed. The family was encouraged to call with questions or concerns.   Primary Decision Maker HCPOA- Adriana Rams- caretaker  Physical Exam Vitals reviewed.  Constitutional:      General: She is not in acute distress.    Appearance: She is not ill-appearing.  HENT:     Head: Normocephalic and atraumatic.     Mouth/Throat:     Mouth: Mucous membranes are moist.  Pulmonary:     Effort: Pulmonary effort is normal. No respiratory distress.     Comments: Inability to speak in complete sentences d/t SOB Abdominal:     General: Abdomen is  flat. There is no distension.     Tenderness: There is no abdominal tenderness. There is no guarding.  Neurological:     Mental Status: She is alert.   Recommendations/Plan: Continue DNR/DNI status as previously documented    Continue current supportive interventions TOC working on SNF placement   Palliative Assessment/Data: 50-60%   Discussed plan of care with TOC/ACC liaison.   Thank you for this consult. Palliative medicine will continue to follow and assist holistically.   Time Total: 75 minutes  Time spent includes: Detailed review of medical records (labs, imaging, vital signs), medically appropriate exam (mental status, respiratory, cardiac, skin), discussed with treatment team, counseling and educating patient, family and staff, documenting clinical information, medication management and coordination of care.     Devere Sacks, ELNITA- [facility] Palliative Medicine Team  03/23/2024 8:39 AM  Office [phone]  Pager [phone]     Please contact Palliative Medicine Team providers  via AMION for questions and concerns.

## 2024-03-23 NOTE — Plan of Care (Signed)
   Problem: Education: Goal: Ability to describe self-care measures that may prevent or decrease complications (Diabetes Survival Skills Education) will improve Outcome: Progressing

## 2024-03-23 NOTE — Consult Note (Signed)
 PHARMACY CONSULT NOTE - ELECTROLYTES  Pharmacy Consult for Electrolyte Monitoring and Replacement   Recent Labs: Height: 5' 7 (170.2 cm) Weight: 45.6 kg (100 lb 8.5 oz) (From Bed Scale per ED RN) IBW/kg (Calculated) : 61.6 Estimated Creatinine Clearance: 24.2 mL/min (A) (by C-G formula based on SCr of 1.31 mg/dL (H)). Potassium (mmol/L)  Date Value  03/23/2024 3.9  10/07/2012 4.1   Magnesium  (mg/dL)  Date Value  92/89/7974 1.9  10/02/2012 2.0   Calcium  (mg/dL)  Date Value  92/87/7974 8.0 (L)   Calcium , Total (mg/dL)  Date Value  98/73/7985 8.2 (L)   Albumin (g/dL)  Date Value  92/91/7974 2.4 (L)   Phosphorus (mg/dL)  Date Value  92/91/7974 2.9   Sodium (mmol/L)  Date Value  03/23/2024 139  10/07/2012 142   Corrected Ca: 9.48 mg/dL  Assessment  Wanda Monroe is a 83 y.o. female presenting with respiratory distress. 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 Pharmacy has been consulted to monitor and replace electrolytes.  Diet: full liquid,   per Speech 7/8: Please give Pills CRUSHED in Puree for safer, easier oral phase swallowing.  MIVF:none Pertinent medications: lasix  40 mg IV given 7/9 am and pm  Goal of Therapy: Electrolytes WNL  Plan:  No electrolyte replacement at this time Check electrolytes with AM labs  Thank you for allowing pharmacy to be a part of this patient's care.  Rance Smithson A Zalia Hautala, PharmD Clinical Pharmacist 03/23/2024 8:03 AM

## 2024-03-23 NOTE — Progress Notes (Signed)
 PROGRESS NOTE    Terilyn M Ossa  FMW:[id number] DOB: [birth year] DOA: 03/18/2024 PCP: [facility], [facility]  [id number]  LOS: 5 days   Brief hospital course:  Wanda Monroe is a 83 y.o. 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    She presented from her facility with respiratory distress, lethargy and confusion from baseline.  She was hypoxic to the 70s with EMS and placed on NRB for transport, transitioning to BiPAP on arrival and subsequently HFNC by the time of admission. Tmax in the ED was 105 (rectal) and patient was tachycardic to 132 with respiratory rate up to 31.  O2 sats were in the high 90s on BiPAP.    7/10: Vital stable, mild hypokalemia which is being repleted.  Hemoglobin at 7.3, checking anemia panel and starting on supplement.  Slight increase in creatinine to 1.46-patient did receive a dose of IV Lasix  yesterday.  PT and OT evaluation ordered.  7/11: Hemodynamically stable, hypokalemia resolved, increasing creatinine now at 1.61.  Giving some gentle IV fluid Hemoglobin at 7.6 and anemia panel with low TIBC and folate, B12 pending.  She was started on supplement. Palliative care care was also consulted-apparently patient was with hospice before and later revoked.  7/12: Remained hemodynamically stable, creatinine improving, at 1.31 today.  Awaiting placement.  [facility] will follow for outpatient palliative/hospice needs.  B12 209 which is borderline low.  Assessment & Plan:   * Sepsis due to pneumonia (HCC) Sepsis criteria include fever, tachycardia, tachypnea. --source presumed PNA.  CXR didn't show obvious consolidation, however, procal elevated at 5.64.  Received  Broad-spectrum antibiotics of cefepime  vancomycin  and Flagyl  on presentation. --cont ceftriaxone  and azithromycin -will complete a 5-day course today.  Acute on chronic respiratory failure with hypoxia (HCC) COPD on 2L baseline Patient presented with acute respiratory distress, hypoxic to the 70s with EMS, NRB-> BiPAP-> HFNC Secondary to suspected pneumonia No overt wheezing so exacerbation of COPD not suspected --down to 2L O2 today, which is her baseline.  Patient  Hypokalemia Hypomag Potassium of 3.9 today, magnesium  1.9 on last check - Continue to monitor lites and replete as needed  Acute metabolic encephalopathy Dementia without behavioral disturbance Depression Patient presented with increased confusion from baseline for dementia, likely related to acute infection --delirium precaution   Hypotension --received boluses f/b MIVF.  started on solu-cortef  on admission.  --cont solu-cortef  -starting a quick taper-will be done by 7/15 --cont midodrine  (new)  History of hemorrhagic CVA (cerebrovascular accident) No acute issues suspected  Chronic kidney disease, stage 3a (HCC) Anion gap metabolic acidosis Patient developed AKI which started improving with creatinine at 1.31 today Metabolic acidosis resolved - Giving some gentle IV fluid - Monitor renal function  HTN (hypertension), currently not active Hold antihypertensives   Diabetes mellitus without complication (HCC) Hyperglycemia due to steroid use --ACHS and SSI for now  Normocytic anemia. - Slowly decreasing hemoglobin without any obvious bleeding.  At 7.6 today.  Anemia panel consistent with anemia of chronic disease and folate deficiency, B12 pending - Starting her on supplement - Monitor hemoglobin - Transfuse if below 7  Frailty History of dementia, recent fall in May 2025 with multiple fractures Previously on hospice On chronic immunosuppressive therapy now with severe of infection High  risk of  mortality in the next 6 months PT and OT -recommending SNF Palliative care was also consulted  Rheumatoid arthritis (HCC) Holding immunosuppressive therapy --hold home oxycodone  due to intermittent hypotension  Neutropenia  --Improved --monitor  Anxiety --atarax  PRN  Acute urinary retention.  Foley catheter was placed yesterday when she was unable to urinate and significant amount of urine on bladder scan. - Continue with Foley care catheter -we will try giving her a voiding trial in a day or so.   DVT prophylaxis: Lovenox  SQ Code Status: DNR  Family Communication:   Level of care: MedSurg Dispo:   The patient is from: SNF Anticipated d/c is to: SNF Anticipated d/c date is: Medically stable for SNF   Subjective and Interval History:  Patient was seen and examined today.  No new concern.  She wants to go home but agrees to go to rehab and explained   Objective: Vitals:   03/22/24 2003 03/23/24 0358 03/23/24 0739 03/23/24 1507  BP: (!) 130/47 (!) 129/109 (!) 119/54 (!) 116/48  Pulse: 64 (!) 42 62 77  Resp: 15 18 16 17   Temp: (!) 97.5 F (36.4 C) (!) 97.1 F (36.2 C) 97.6 F (36.4 C) 98 F (36.7 C)  TempSrc:   Oral Oral  SpO2: 100% (!) 85% 100% 100%  Weight:      Height:        Intake/Output Summary (Last 24 hours) at 03/23/2024 1703 Last data filed at 03/23/2024 0430 Gross per 24 hour  Intake 196.09 ml  Output 901 ml  Net -704.91 ml   Filed Weights   03/19/24 0455  Weight: 45.6 kg    Examination:  General.  Frail and severely malnourished lady, in no acute distress. Pulmonary.  Lungs clear bilaterally, normal respiratory effort. CV.  Regular rate and rhythm, no JVD, rub or murmur. Abdomen.  Soft, nontender, nondistended, BS positive. CNS.  Alert and oriented .  No focal neurologic deficit. Extremities.  No edema, no cyanosis, pulses intact and symmetrical.    Data Reviewed: I have personally reviewed labs and imaging studies  Time spent: 44  minutes  This record has been created using Conservation officer, historic buildings. Errors have been sought and corrected,but may not always be located. Such creation errors do not reflect on the standard of care.   Amaryllis Dare, MD [facility] If 7PM-7AM, please contact night-coverage 03/23/2024, 5:03 PM

## 2024-03-23 NOTE — Plan of Care (Signed)
  Problem: Coping: Goal: Ability to adjust to condition or change in health will improve Outcome: Progressing   Problem: Nutritional: Goal: Maintenance of adequate nutrition will improve Outcome: Progressing Goal: Progress toward achieving an optimal weight will improve Outcome: Progressing   Problem: Skin Integrity: Goal: Risk for impaired skin integrity will decrease Outcome: Progressing   Problem: Respiratory: Goal: Ability to maintain adequate ventilation will improve Outcome: Progressing   Problem: Clinical Measurements: Goal: Signs and symptoms of infection will decrease Outcome: Progressing   Problem: Respiratory: Goal: Ability to maintain adequate ventilation will improve Outcome: Progressing   Problem: Education: Goal: Knowledge of General Education information will improve Description: Including pain rating scale, medication(s)/side effects and non-pharmacologic comfort measures Outcome: Progressing   Problem: Health Behavior/Discharge Planning: Goal: Ability to manage health-related needs will improve Outcome: Progressing   Problem: Clinical Measurements: Goal: Respiratory complications will improve Outcome: Progressing   Problem: Activity: Goal: Risk for activity intolerance will decrease Outcome: Progressing   Problem: Coping: Goal: Level of anxiety will decrease Outcome: Progressing   Problem: Elimination: Goal: Will not experience complications related to bowel motility Outcome: Progressing Goal: Will not experience complications related to urinary retention Outcome: Progressing   Problem: Pain Managment: Goal: General experience of comfort will improve and/or be controlled Outcome: Progressing   Problem: Safety: Goal: Ability to remain free from injury will improve Outcome: Progressing

## 2024-03-24 DIAGNOSIS — J189 Pneumonia, unspecified organism: Secondary | ICD-10-CM | POA: Diagnosis not present

## 2024-03-24 DIAGNOSIS — Z515 Encounter for palliative care: Secondary | ICD-10-CM | POA: Diagnosis not present

## 2024-03-24 DIAGNOSIS — Z7189 Other specified counseling: Secondary | ICD-10-CM | POA: Diagnosis not present

## 2024-03-24 DIAGNOSIS — J9601 Acute respiratory failure with hypoxia: Secondary | ICD-10-CM

## 2024-03-24 DIAGNOSIS — N179 Acute kidney failure, unspecified: Secondary | ICD-10-CM

## 2024-03-24 DIAGNOSIS — D72819 Decreased white blood cell count, unspecified: Secondary | ICD-10-CM

## 2024-03-24 DIAGNOSIS — Z66 Do not resuscitate: Secondary | ICD-10-CM

## 2024-03-24 DIAGNOSIS — R4182 Altered mental status, unspecified: Secondary | ICD-10-CM

## 2024-03-24 DIAGNOSIS — A419 Sepsis, unspecified organism: Secondary | ICD-10-CM | POA: Diagnosis not present

## 2024-03-24 LAB — CBC
HCT: 26.6 % — ABNORMAL LOW (ref 36.0–46.0)
Hemoglobin: 7.9 g/dL — ABNORMAL LOW (ref 12.0–15.0)
MCH: 27.1 pg (ref 26.0–34.0)
MCHC: 29.7 g/dL — ABNORMAL LOW (ref 30.0–36.0)
MCV: 91.1 fL (ref 80.0–100.0)
Platelets: 812 K/uL — ABNORMAL HIGH (ref 150–400)
RBC: 2.92 MIL/uL — ABNORMAL LOW (ref 3.87–5.11)
RDW: 20.2 % — ABNORMAL HIGH (ref 11.5–15.5)
WBC: 9.9 K/uL (ref 4.0–10.5)
nRBC: 0.9 % — ABNORMAL HIGH (ref 0.0–0.2)

## 2024-03-24 LAB — BASIC METABOLIC PANEL WITH GFR
Anion gap: 6 (ref 5–15)
BUN: 35 mg/dL — ABNORMAL HIGH (ref 8–23)
CO2: 28 mmol/L (ref 22–32)
Calcium: 8.3 mg/dL — ABNORMAL LOW (ref 8.9–10.3)
Chloride: 105 mmol/L (ref 98–111)
Creatinine, Ser: 1.04 mg/dL — ABNORMAL HIGH (ref 0.44–1.00)
GFR, Estimated: 54 mL/min — ABNORMAL LOW (ref >60)
Glucose, Bld: 109 mg/dL — ABNORMAL HIGH (ref 70–99)
Potassium: 4 mmol/L (ref 3.5–5.1)
Sodium: 139 mmol/L (ref 135–145)

## 2024-03-24 LAB — GASTROINTESTINAL PANEL BY PCR, STOOL (REPLACES STOOL CULTURE)

## 2024-03-24 LAB — GLUCOSE, CAPILLARY
Glucose-Capillary: 119 mg/dL — ABNORMAL HIGH (ref 70–99)
Glucose-Capillary: 114 mg/dL — ABNORMAL HIGH (ref 70–99)
Glucose-Capillary: 136 mg/dL — ABNORMAL HIGH (ref 70–99)
Glucose-Capillary: 229 mg/dL — ABNORMAL HIGH (ref 70–99)
Glucose-Capillary: 225 mg/dL — ABNORMAL HIGH (ref 70–99)
Glucose-Capillary: 169 mg/dL — ABNORMAL HIGH (ref 70–99)

## 2024-03-24 LAB — CULTURE, BLOOD (ROUTINE X 2)
Culture: NO GROWTH
Culture: NO GROWTH
Special Requests: ADEQUATE

## 2024-03-24 MED ORDER — ENOXAPARIN SODIUM 40 MG/0.4ML IJ SOSY
40.0000 mg | PREFILLED_SYRINGE | INTRAMUSCULAR | Status: DC
  Administered 2024-03-24 – 2024-03-25 (×2): 40 mg via SUBCUTANEOUS
  Filled 2024-03-24 (×2): qty 0.4

## 2024-03-24 NOTE — Progress Notes (Signed)
 PROGRESS NOTE    Wanda Monroe  FMW:[id number] DOB: [birth year] DOA: 03/18/2024 PCP: [facility]  [id number]-AA  LOS: 6 days   Brief hospital course:  Wanda Monroe is a 83 y.o. 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    She presented from her facility with respiratory distress, lethargy and confusion from baseline.  She was hypoxic to the 70s with EMS and placed on NRB for transport, transitioning to BiPAP on arrival and subsequently HFNC by the time of admission. Tmax in the ED was 105 (rectal) and patient was tachycardic to 132 with respiratory rate up to 31.  O2 sats were in the high 90s on BiPAP.    7/10: Vital stable, mild hypokalemia which is being repleted.  Hemoglobin at 7.3, checking anemia panel and starting on supplement.  Slight increase in creatinine to 1.46-patient did receive a dose of IV Lasix  yesterday.  PT and OT evaluation ordered.  7/11: Hemodynamically stable, hypokalemia resolved, increasing creatinine now at 1.61.  Giving some gentle IV fluid Hemoglobin at 7.6 and anemia panel with low TIBC and folate, B12 pending.  She was started on supplement. Palliative care care was also consulted-apparently patient was with hospice before and later revoked.  7/12: Remained hemodynamically stable, creatinine improving, at 1.31 today.  Awaiting placement.  [facility] will follow for outpatient palliative/hospice needs.  B12 209 which is borderline low.  7/13: Remained hemodynamically stable, thrombocytosis likely reactive.  Improving creatinine and able to wean to room air. GI pathogen panel negative.  Assessment & Plan:   * Sepsis due to pneumonia (HCC) Sepsis  criteria include fever, tachycardia, tachypnea. --source presumed PNA.  CXR didn't show obvious consolidation, however, procal elevated at 5.64.  Received Broad-spectrum antibiotics of cefepime  vancomycin  and Flagyl  on presentation. --cont ceftriaxone  and azithromycin -will complete a 5-day course today.  Acute on chronic respiratory failure with hypoxia (HCC) COPD on 2L baseline Patient presented with acute respiratory distress, hypoxic to the 70s with EMS, NRB-> BiPAP-> HFNC Secondary to suspected pneumonia No overt wheezing so exacerbation of COPD not suspected --down to 2L O2 today, which is her baseline.  Patient  Hypokalemia Hypomag Potassium of 4.0 today, magnesium  1.9 on last check - Continue to monitor lites and replete as needed  Acute metabolic encephalopathy Dementia without behavioral disturbance Depression Patient presented with increased confusion from baseline for dementia, likely related to acute infection --delirium precaution   Hypotension --received boluses f/b MIVF.  started on solu-cortef  on admission.  --cont solu-cortef  -starting a quick taper-will be done by 7/15 -- Discontinuing midodrine  as blood pressure is now mildly elevated.  History of hemorrhagic CVA (cerebrovascular accident) No acute issues suspected  Chronic kidney disease, stage 3a (HCC) Anion gap metabolic acidosis Patient developed AKI which started improving with creatinine at 1.04 today Metabolic acidosis resolved - Monitor renal function  HTN (hypertension), currently not active Hold antihypertensives  DC midodrine  today.  Diabetes mellitus without complication (HCC) Hyperglycemia due to steroid use --ACHS and SSI for now  Normocytic anemia. - Hgb now stable at 7.9.  Anemia panel consistent with anemia of chronic disease and folate deficiency, B12 209 - Starting her on supplement- continue - Monitor hemoglobin - Transfuse if below 7  Frailty History of dementia,  recent fall  in May 2025 with multiple fractures Previously on hospice On chronic immunosuppressive therapy now with severe of infection High risk of mortality in the next 6 months PT and OT -recommending SNF Palliative care was also consulted  Rheumatoid arthritis (HCC) Holding immunosuppressive therapy --hold home oxycodone  due to intermittent hypotension  Neutropenia  --Improved --monitor  Anxiety --atarax  PRN  Acute urinary retention.  Foley catheter was placed yesterday when she was unable to urinate and significant amount of urine on bladder scan. - DC Foley to give her a voiding trial.  DVT prophylaxis: Lovenox  SQ Code Status: DNR  Family Communication:   Level of care: MedSurg Dispo:   The patient is from: SNF Anticipated d/c is to: SNF Anticipated d/c date is: Medically stable for SNF   Subjective and Interval History:  Patient was seen and examine today, no new concern.  She was asking when she can go home.   Objective: Vitals:   03/24/24 0310 03/24/24 0807 03/24/24 1133 03/24/24 1538  BP: (!) 154/60 (!) 142/56 (!) 114/51 (!) 138/58  Pulse: 69 78 75 71  Resp: 16 17  17   Temp: 97.7 F (36.5 C)   97.7 F (36.5 C)  TempSrc: Oral     SpO2: 98% 97%  97%  Weight:      Height:        Intake/Output Summary (Last 24 hours) at 03/24/2024 1606 Last data filed at 03/24/2024 0844 Gross per 24 hour  Intake 120 ml  Output 800 ml  Net -680 ml   Filed Weights   03/19/24 0455  Weight: 45.6 kg    Examination:  General.  Severely malnourished elderly lady, in no acute distress. Pulmonary.  Lungs clear bilaterally, normal respiratory effort. CV.  Regular rate and rhythm, no JVD, rub or murmur. Abdomen.  Soft, nontender, nondistended, BS positive. CNS.  Alert and oriented .  No focal neurologic deficit. Extremities.  No edema,  pulses intact and symmetrical. Psychiatry.  Judgment and insight appears normal.    Data Reviewed: I have personally reviewed labs and imaging  studies  Time spent: 45 minutes  This record has been created using Conservation officer, historic buildings. Errors have been sought and corrected,but may not always be located. Such creation errors do not reflect on the standard of care.   Amaryllis Dare, MD [facility] If 7PM-7AM, please contact night-coverage 03/24/2024, 4:06 PM

## 2024-03-24 NOTE — Progress Notes (Signed)
 Pt requesting cast be removed. Pt states she was supposed to have cast removed already and would like ortho to remove as soon as possible. Ortho currently not consulted. Per chart review, pt was seen in [facility] on 02/21/24 where splint from the 02/14/24 fall was replaced by a cast. Per note from that visit, pt to follow-up in 4 weeks for cast removal, re-evaluation and new imaging.

## 2024-03-24 NOTE — Plan of Care (Signed)
  Problem: Coping: Goal: Ability to adjust to condition or change in health will improve Outcome: Progressing   Problem: Health Behavior/Discharge Planning: Goal: Ability to manage health-related needs will improve Outcome: Progressing   Problem: Metabolic: Goal: Ability to maintain appropriate glucose levels will improve Outcome: Progressing

## 2024-03-24 NOTE — Progress Notes (Signed)
 Physical Therapy Treatment Patient Details Name: Wanda Monroe MRN: [id number] DOB: 1941-07-20 Today's Date: 03/24/2024   History of Present Illness Pt is an 83 y.o. 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    PT Comments  Min a to EOB with cues and assist not to push up on L hand to get to edge.  Steady in sitting.  Initially poor balance with post lean once standing with min/mod a x 1 but does improve during session.  She is able to walk to door and back and after a short seated rest, she walks 40' into hallway and back to recliner.  Confidence is low but generally does well with +1 assist.    Discharge recommendations remain appropriate for <3 hrs a day therapy.  Pt will need +24 hour assist at home for general mobility and safety if she discharges home.   If plan is discharge home, recommend the following: A little help with bathing/dressing/bathroom;Direct supervision/assist for financial management;Assist for transportation;Assistance with feeding;Assistance with cooking/housework;A little help with walking and/or transfers   Can travel by private vehicle        Equipment Recommendations       Recommendations for Other Services       Precautions / Restrictions Precautions Precautions: Fall;Other (comment) (has cast to LUE (wrist and forearm)) Precaution/Restrictions Comments: unclear regarding LUE cast (MD unable to confirm WB status at bedside visit) Restrictions Other Position/Activity Restrictions: Unknown for LUE     Mobility  Bed Mobility Overal bed mobility: Needs Assistance Bed Mobility: Supine to Sit     Supine to sit: Min assist, HOB elevated, Used rails      General bed mobility comments: cues not to use L arm to assist Patient Response: Cooperative  Transfers Overall transfer level: Needs assistance Equipment used: 1 person hand held assist Transfers: Sit to/from Stand Sit to Stand: Min assist, Mod assist                Ambulation/Gait Ambulation/Gait assistance: Min Chemical engineer (Feet): 40 Feet Assistive device: 1 person hand held assist Gait Pattern/deviations: Step-through pattern, Decreased step length - right, Decreased step length - left, Narrow base of support Gait velocity: dec     General Gait Details: 72' then 58' with HHA increasing confidence   Stairs             Wheelchair Mobility     Tilt Bed Tilt Bed Patient Response: Cooperative  Modified Rankin (Stroke Patients Only)       Balance Overall balance assessment: Needs assistance Sitting-balance support: Feet supported Sitting balance-Leahy Scale: Good     Standing balance support: Single extremity supported Standing balance-Leahy Scale: Poor Standing balance comment: initially needs more assist but does progress.  unsafe to transfer/walk without assist.                            Communication Communication Communication: No apparent difficulties  Cognition Arousal: Alert Behavior During Therapy: WFL for tasks assessed/performed   PT - Cognitive impairments: History of cognitive impairments                       PT - Cognition Comments:  asking for cast to be removed.  reviewed info in chart and pt voiced understanding but likely will not rememeber and will need to be reminded. Following commands: Intact      Cueing Cueing Techniques: Verbal cues, Gestural cues, Tactile cues, Visual cues  Exercises      General Comments        Pertinent Vitals/Pain Pain Assessment Pain Assessment: No/denies pain    Home Living                          Prior Function            PT Goals (current  goals can now be found in the care plan section) Progress towards PT goals: Progressing toward goals    Frequency    Min 2X/week      PT Plan      Co-evaluation              AM-PAC PT 6 Clicks Mobility   Outcome Measure  Help needed turning from your back to your side while in a flat bed without using bedrails?: A Little Help needed moving from lying on your back to sitting on the side of a flat bed without using bedrails?: A Little Help needed moving to and from a bed to a chair (including a wheelchair)?: A Little Help needed standing up from a chair using your arms (e.g., wheelchair or bedside chair)?: A Little Help needed to walk in hospital room?: A Little Help needed climbing 3-5 steps with a railing? : A Lot 6 Click Score: 17    End of Session Equipment Utilized During Treatment: Gait belt Activity Tolerance: Patient tolerated treatment well Patient left: in chair;with chair alarm set;with call bell/phone within reach Nurse Communication: Mobility status PT Visit Diagnosis: Other abnormalities of gait and mobility (R26.89);Difficulty in walking, not elsewhere classified (R26.2);Muscle weakness (generalized) (M62.81)     Time: 9149-9092 PT Time Calculation (min) (ACUTE ONLY): 17 min  Charges:    $Gait Training: 8-22 mins PT General Charges $$ ACUTE PT VISIT: 1 Visit                    Lauraine Gills, PTA 03/24/24, 9:21 AM

## 2024-03-24 NOTE — Progress Notes (Signed)
 Palliative Care Progress Note, Assessment & Plan   Patient Name: Wanda Monroe       Date: 03/24/2024 DOB: July 17, 1941  Age: 83 y.o. MRN#: [id number] Attending Physician: Caleen Qualia, MD Primary Care Physician: [facility] Admit Date: 03/18/2024  Subjective: Complains of chronic back pain. Pt reports sleeping well, RN at bedside states patient only slept around 45 minutes last night per night RN. Pt ate breakfast this morning. Wants L arm cast removed. RN states MD is working with ortho for f/u. Denies CP/SOB. Wants to go home.   HPI: 83 y.o. 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 Presented to ED 03/18/2024 from home c/o respiratory distress, lethargy and confusion. EMS reported on scene, patient has SPO2 in the 70's. She was placed on NRB and trsnported. She required transition to BiPAP on arrival to ED.    ED workup found patient to be febrile at 105 (rectal), tachycardic 132 and RR 32. SpO2 was 90's on BiPAP. She was subsequently transitioned to [facility]NC.    ED labs significant for Na+ 133, K+ 2.9, Chloride 87, Glucose 143, creatinine 1.37, Ca++ 8.8, albumin 3.2, GFR 39, WBC 1.0, Hgb 9.5.  UA with rare bacteria. CXR showed streaky atelectasis or minimal infiltrate left base. No pleural effusion. Stable cardiomediastinal silhouette. Moderate hiatal hernia. Aortic atherosclerosis.   Patient was admitted to [facility] for management of sepsis d/t PNA, acute on chronic respiratory failure with hypoxia, hypokalemia and hypotension.   PMT consulted for goals of care discussions.   Summary of counseling/coordination of care: Extensive chart review completed prior to meeting  patient including labs, vital signs, imaging, progress notes, orders, and available advanced directive documents from current and previous encounters.   After reviewing the patient's chart and assessing the patient at bedside, I spoke with patient in regards to symptom management and goals of care.   Very pleasant, elderly female resting in bed with nurses at bedside. She is alert to self, time, location and situation, but tends to ramble with discussion on topics irrelevant to conversation. She does not require O2 today. Even, unlabored respirations. She is in no distress.   Vitals:   03/23/24 2123 03/24/24 0310 03/24/24 0807 03/24/24 1133  BP: (!) 148/58 (!) 154/60 (!) 142/56 (!) 114/51  Pulse: 72 69 78 75  Temp: 97.9 F (36.6 C) 97.7 F (36.5 C)    Resp: 16 16 17    Height:      Weight:      SpO2: 100% 98% 97%   TempSrc: Oral Oral       Therapeutic silence and active listening provided for patient to share her thoughts and emotions regarding current medical situation.  Emotional support provided.  Physical Exam Vitals reviewed.  Constitutional:      General: She is not in acute distress. HENT:     Head: Normocephalic and atraumatic.     Mouth/Throat:     Mouth: Mucous membranes are moist.  Pulmonary:     Effort: Pulmonary effort is normal. No respiratory distress.  Musculoskeletal:     Right lower leg: No  edema.     Left lower leg: No edema.  Skin:    General: Skin is warm and dry.  Neurological:     Mental Status: She is alert. Mental status is at baseline.  Psychiatric:        Mood and Affect: Mood normal.        Behavior: Behavior normal.   Recommendations/Plan: Continue DNR/DNI status as previously documented    Continue current supportive interventions TOC working on SNF placement  PMT will continue to follow peripherally for needs           Total Time 50 minutes   Discussed plan of care with primary nurse.   Time spent includes: Detailed review of medical  records (labs, imaging, vital signs), medically appropriate exam (mental status, respiratory, cardiac, skin), discussed with treatment team, counseling and educating patient, family and staff, documenting clinical information, medication management and coordination of care.     Wanda Monroe, Wanda Monroe- [facility] Palliative Medicine Team  03/24/2024 1:24 PM  Office [phone]  Pager [phone]

## 2024-03-25 DIAGNOSIS — J189 Pneumonia, unspecified organism: Secondary | ICD-10-CM | POA: Diagnosis not present

## 2024-03-25 DIAGNOSIS — R4182 Altered mental status, unspecified: Secondary | ICD-10-CM

## 2024-03-25 DIAGNOSIS — D62 Acute posthemorrhagic anemia: Secondary | ICD-10-CM

## 2024-03-25 DIAGNOSIS — J690 Pneumonitis due to inhalation of food and vomit: Secondary | ICD-10-CM

## 2024-03-25 DIAGNOSIS — J9601 Acute respiratory failure with hypoxia: Secondary | ICD-10-CM | POA: Diagnosis not present

## 2024-03-25 DIAGNOSIS — E46 Unspecified protein-calorie malnutrition: Secondary | ICD-10-CM

## 2024-03-25 DIAGNOSIS — A419 Sepsis, unspecified organism: Secondary | ICD-10-CM | POA: Diagnosis not present

## 2024-03-25 DIAGNOSIS — N179 Acute kidney failure, unspecified: Secondary | ICD-10-CM

## 2024-03-25 DIAGNOSIS — M810 Age-related osteoporosis without current pathological fracture: Secondary | ICD-10-CM

## 2024-03-25 LAB — GLUCOSE, CAPILLARY
Glucose-Capillary: 102 mg/dL — ABNORMAL HIGH (ref 70–99)
Glucose-Capillary: 233 mg/dL — ABNORMAL HIGH (ref 70–99)

## 2024-03-25 LAB — OCCULT BLOOD X 1 CARD TO LAB, STOOL: Fecal Occult Bld: NEGATIVE

## 2024-03-25 MED ORDER — ADULT MULTIVITAMIN W/MINERALS CH: 1.0000 | ORAL_TABLET | Freq: Every day | ORAL | Status: DC

## 2024-03-25 MED ORDER — PREDNISONE 10 MG PO TABS: 10.0000 mg | ORAL_TABLET | Freq: Every day | ORAL | Status: DC

## 2024-03-25 MED ORDER — HYDROXYZINE HCL 50 MG PO TABS: 50.0000 mg | ORAL_TABLET | Freq: Three times a day (TID) | ORAL | Status: DC | PRN

## 2024-03-25 MED ORDER — LOSARTAN POTASSIUM 25 MG PO TABS
25.0000 mg | ORAL_TABLET | Freq: Every day | ORAL | Status: DC
  Administered 2024-03-25: 25 mg via ORAL
  Filled 2024-03-25: qty 1

## 2024-03-25 MED ORDER — ENSURE PLUS HIGH PROTEIN PO LIQD: 237.0000 mL | Freq: Three times a day (TID) | ORAL | Status: DC

## 2024-03-25 NOTE — Progress Notes (Signed)
 Nurse attempted to call [facility] [phone]) to provide patient report as patient is being discharged today.   Nurse attempted to call [facility]. Phone noted to ring and be answered and then disconnected.

## 2024-03-25 NOTE — Plan of Care (Signed)
   Problem: Metabolic: Goal: Ability to maintain appropriate glucose levels will improve Outcome: Progressing   Problem: Nutritional: Goal: Maintenance of adequate nutrition will improve Outcome: Progressing Goal: Progress toward achieving an optimal weight will improve Outcome: Progressing

## 2024-03-25 NOTE — TOC Transition Note (Addendum)
 Transition of Care [facility]) - Discharge Note   Patient Details  Name: Wanda Monroe MRN: [id number] Date of Birth: [birth year]  Transition of [facility]) CM/SW Contact:  Elouise LULLA Capri, RN 03/25/2024, 11:00 AM   Clinical Narrative:     Alert from Dr. Caleen, patient is medically ready to discharge to SNF. Noted, patient accepted to [facility]. CM call to Daril Clarissa shed: [phone] regarding SNF admission today. Per Daril, patient can admit today. CM call to [employer], phone; [phone] regarding BLS transport scheduling. CM spoke to [facility]. BLS transport arranged for today at 1300. CM awaiting discharge summary/orders.   CM call to patient's nephew, Wanda Monroe, phone; [phone] regarding pending discharge and admission to [facility]. Patient's nephew verbalized understanding.  Final next level of care: Skilled Nursing Facility Barriers to Discharge: No Barriers Identified   Patient Goals and CMS Choice    SNF/[facility]  Discharge Placement      SNF           Discharge Plan and Services Additional resources added to the After Visit Summary for     Social Drivers of Health (SDOH) Interventions SDOH Screenings   Food Insecurity: Patient Unable To Answer (03/19/2024)  Recent Concern: Food Insecurity - Food Insecurity Present (01/19/2024)  Housing: Unknown (02/21/2024)   Received from [facility]  Transportation Needs: No Transportation Needs (01/19/2024)  Utilities: Not At Risk (01/19/2024)  Social Connections: Socially Isolated (01/19/2024)  Tobacco Use: Low Risk  (03/18/2024)     Readmission Risk Interventions     No data to display

## 2024-03-25 NOTE — Progress Notes (Signed)
 Physical Therapy Treatment Patient Details Name: Wanda Monroe MRN: [id number] DOB: 02-Jan-1941 Today's Date: 03/25/2024   History of Present Illness Pt is an 83 y.o. 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    PT Comments  Pt on BSC with tech upon arrival.  Assisted with bath then she is able to stand with mod a x 1 during session.  Pt does struggle more with gait and transfers today.  She is limited with gait in room today with HHA min a  +1 to door and back then after short seated rest she walks to end of bed and back to recliner.  She has increased shuffling gait, more balance deficits and general fatigue noted.  She does remain in chair after session with safety on.  Asks for and is given word search book which she enjoys at home.   If plan is discharge home, recommend the following: Direct supervision/assist for financial management;Assist for transportation;Assistance with feeding;Assistance with cooking/housework;A lot of help with walking and/or transfers;A lot of help with bathing/dressing/bathroom   Can travel by private vehicle        Equipment Recommendations       Recommendations for Other Services       Precautions / Restrictions Precautions Precautions: Fall;Other (comment) (has cast to LUE (wrist and forearm)) Precaution/Restrictions Comments: unclear regarding LUE cast (MD unable to confirm WB status at bedside visit) Restrictions Other Position/Activity Restrictions: Unknown for LUE     Mobility  Bed Mobility                 Patient Response: Cooperative  Transfers Overall transfer level: Needs assistance Equipment used: 1 person hand held  assist Transfers: Sit to/from Stand Sit to Stand: Min assist, Mod assist                Ambulation/Gait Ambulation/Gait assistance: Min assist Gait Distance (Feet): 20 Feet Assistive device: 1 person hand held assist Gait Pattern/deviations: Step-through pattern, Decreased step length - right, Decreased step length - left, Narrow base of support Gait velocity: dec     General Gait Details: does stuggle more today with gait limted to 20' x 1 and 10'x 1   Stairs             Wheelchair Mobility     Tilt Bed Tilt Bed Patient Response: Cooperative  Modified Rankin (Stroke Patients Only)       Balance Overall balance assessment: Needs assistance Sitting-balance support: Feet supported Sitting balance-Leahy Scale: Good     Standing balance support: Single extremity supported Standing balance-Leahy Scale: Poor Standing balance comment: gait does not improve today with time.  struggles more today                            Communication Communication Communication: No apparent difficulties  Cognition Arousal: Alert Behavior During Therapy: WFL for tasks assessed/performed   PT - Cognitive impairments: History of cognitive impairments                         Following commands: Intact      Cueing Cueing Techniques: Verbal cues, Gestural cues, Tactile cues, Visual cues  Exercises  Other Exercises Other Exercises: on [facility] upon arrival for bathing with tech.  assisted with bath and transfers prior to gait    General Comments        Pertinent Vitals/Pain Pain Assessment Pain Assessment: No/denies pain    Home Living                          Prior Function            PT Goals (current goals can now be found in the care plan section) Progress towards PT goals: Progressing toward goals    Frequency    Min 2X/week      PT Plan      Co-evaluation              AM-PAC PT 6 Clicks Mobility   Outcome  Measure  Help needed turning from your back to your side while in a flat bed without using bedrails?: A Little Help needed moving from lying on your back to sitting on the side of a flat bed without using bedrails?: A Little Help needed moving to and from a bed to a chair (including a wheelchair)?: A Little Help needed standing up from a chair using your arms (e.g., wheelchair or bedside chair)?: A Lot Help needed to walk in hospital room?: A Little Help needed climbing 3-5 steps with a railing? : A Lot 6 Click Score: 16    End of Session Equipment Utilized During Treatment: Gait belt Activity Tolerance: Patient tolerated treatment well Patient left: in chair;with chair alarm set;with call bell/phone within reach Nurse Communication: Mobility status PT Visit Diagnosis: Other abnormalities of gait and mobility (R26.89);Difficulty in walking, not elsewhere classified (R26.2);Muscle weakness (generalized) (M62.81)     Time: 1034-1050 PT Time Calculation (min) (ACUTE ONLY): 16 min  Charges:    $Gait Training: 8-22 mins PT General Charges $$ ACUTE PT VISIT: 1 Visit                    Lauraine Gills, PTA 03/25/24, 11:00 AM

## 2024-03-25 NOTE — Plan of Care (Signed)
  Problem: Coping: Goal: Ability to adjust to condition or change in health will improve Outcome: Progressing   Problem: Skin Integrity: Goal: Risk for impaired skin integrity will decrease Outcome: Progressing   Problem: Nutritional: Goal: Maintenance of adequate nutrition will improve Outcome: Progressing   Problem: Respiratory: Goal: Ability to maintain adequate ventilation will improve Outcome: Progressing   Problem: Respiratory: Goal: Ability to maintain adequate ventilation will improve Outcome: Progressing

## 2024-03-25 NOTE — Discharge Summary (Signed)
 Physician Discharge Summary   Patient: Wanda Monroe MRN: [id number] DOB: [birth year]  Admit date:     03/18/2024  Discharge date: 03/25/24  Discharge Physician: Amaryllis Dare   PCP: [facility]   Recommendations at discharge:  Please obtain CBC and BMP on follow-up Outpatient palliative care to follow-up at facility Patient need to follow-up with her orthopedic surgeon at [facility] clinic for removal of cast. Patient to use 2 L of oxygen as needed Follow-up with primary care provider  Discharge Diagnoses: Principal Problem:   Sepsis due to pneumonia [facility]) Active Problems:   Neutropenic fever (HCC)   At risk for severe infection due to immunosuppression   Acute hypoxic respiratory failure (HCC)   Depression   Dementia without behavioral disturbance (HCC)   Acute metabolic encephalopathy   Hypokalemia   History of hemorrhagic CVA (cerebrovascular accident)   HTN (hypertension)   Chronic kidney disease, stage 3a (HCC)   Diabetes mellitus without complication (HCC)   Chronic obstructive pulmonary disease (HCC)   Frailty   Rheumatoid arthritis (HCC)   Protein-calorie malnutrition, severe   AKI (acute kidney injury) (HCC)   Altered mental status   Hospital Course: Wanda Monroe is a 83 y.o. 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     She presented from her facility with respiratory distress, lethargy and confusion from baseline.  She was hypoxic to the 70s with EMS and placed on NRB for transport, transitioning to BiPAP on arrival and subsequently HFNC by the time of admission. Tmax in the ED was 105 (rectal) and patient was tachycardic to 132 with respiratory rate up  to 31.  O2 sats were in the high 90s on BiPAP.    Patient was admitted with sepsis secondary to pneumonia, also found to have neutropenia which was improved.  Patient completed the course of antibiotic.  Patient initially required BiPAP and eventually able to wean back to room air.  She was using 2 L at baseline which she can continue as needed.  Patient had  multiple electrolyte abnormalities during the course of illness which was corrected before discharge.  Her mentation is now back to baseline.  She had an history of dementia and depression, no significant behavioral disturbances noted.  She will continue with her home depression medications.  Patient was hypotensive on admission and was given Solu-Cortef  and midodrine  initially.  Blood pressure slowly improved and midodrine  was discontinued.  She will resume her home antihypertensives on discharge as blood pressure started trending up.  Patient also has AKI on admission which was improved with IV fluid.  Metabolic acidosis resolved.  Patient was found to have normocytic anemia.  Anemia panel with anemia of chronic disease, folate deficiency and B12 borderline low.  She will continue her supplements and follow-up with her primary care provider.  Patient has an history of frailty and dementia, history of fall in May 2025 with multiple fractures.  She is on a chronic immunosuppressive therapy for her rheumatoid arthritis and continue with her home meds and follow-up with her rheumatologist for further assistance.  Patient was apparently on hospice previously which she revoked.  Palliative care was reconsulted and they will need to follow-up at her facility for further assistance and continued duration  of goals of care discussion.  Patient did develop acute urinary retention while in the hospital requiring Foley catheter placement.  Foley catheter was removed on 03/24/2024 and patient is able to void without any difficulty now.  Our physical  therapist evaluated her and recommended going to SNF for further rehab where she is being discharged.  She will continue on current medications and follow-up with her providers for further assistance.       Consultants: Palliative care Procedures performed: None Disposition: Skilled nursing facility Diet recommendation:  Discharge Diet Orders (From admission, onward)     Start     Ordered   03/25/24 0000  Diet - low sodium heart healthy        03/25/24 1101           Carb modified diet DISCHARGE MEDICATION: Allergies as of 03/25/2024       Reactions   Codeine Sulfate Nausea Only   Remicade [infliximab] Other (See Comments)   Infection        Medication List     STOP taking these medications    hydrochlorothiazide  25 MG tablet Commonly known as: HYDRODIURIL    HYDROcodone -acetaminophen  5-325 MG tablet Commonly known as: NORCO/VICODIN   Klor-Con  M20 20 MEQ tablet Generic drug: potassium chloride  SA   LORazepam  0.5 MG tablet Commonly known as: ATIVAN    oxyCODONE  5 MG immediate release tablet Commonly known as: Oxy IR/ROXICODONE    timolol  0.5 % ophthalmic solution Commonly known as: TIMOPTIC        TAKE these medications    acetaminophen  650 MG CR tablet Commonly known as: TYLENOL  Take 650 mg by mouth every 8 (eight) hours.   amLODipine 5 MG tablet Commonly known as: NORVASC Take 5 mg by mouth daily.   Aspercreme Lidocaine  4 % Generic drug: lidocaine  Place 1 patch onto the skin daily.   aspirin  81 MG tablet Take 81 mg by mouth daily. AM   atorvastatin  80 MG tablet Commonly known as: LIPITOR  Take 80 mg by mouth daily. PM   bisacodyl  10 MG suppository Commonly known as: DULCOLAX Place 1 suppository (10 mg total) rectally daily as needed for moderate constipation.   calcium  citrate-vitamin D  315-200 MG-UNIT tablet Commonly known as: CITRACAL+D Take 1 tablet by mouth daily.   cyanocobalamin  1000 MCG tablet Commonly known as: VITAMIN  B12 Take 1,000 mcg by mouth daily.   docusate sodium  100 MG capsule Commonly known as: COLACE Take 1 capsule (100 mg total) by mouth 2 (two) times daily.   DULoxetine  30 MG capsule Commonly known as: CYMBALTA  Take 30 mg by mouth daily.   feeding supplement Liqd Take 237 mLs by mouth 3 (three) times daily between meals.   ferrous sulfate  325 (65 FE) MG tablet Take 1 tablet (325 mg total) by mouth daily with breakfast.   folic acid  1 MG tablet Commonly known as: FOLVITE  Take 1 mg by mouth daily.   gabapentin  100 MG capsule Commonly known as: NEURONTIN  Take 200 mg by mouth 2 (two) times daily.   hydrOXYzine  50 MG tablet Commonly known as: ATARAX  Take 1 tablet (50 mg total) by mouth 3 (three) times daily as needed for anxiety.   ipratropium-albuterol  0.5-2.5 (3) MG/3ML Soln Commonly known as: DUONEB Take 3 mLs by nebulization every 6 (six) hours as needed (breathing).   latanoprost  0.005 % ophthalmic solution Commonly known as: XALATAN  Place 1 drop into both eyes at bedtime.   metFORMIN 500 MG tablet Commonly known as: GLUCOPHAGE Take 500 mg by mouth 2 (two)  times daily with a meal.   methotrexate  2.5 MG tablet Commonly known as: RHEUMATREX Take 12.5 mg by mouth once a week. Thursday   mirtazapine  15 MG tablet Commonly known as: REMERON  Take 15 mg by mouth at bedtime.   multivitamin with minerals Tabs tablet Take 1 tablet by mouth daily. Start taking on: March 26, 2024   ondansetron  4 MG disintegrating tablet Commonly known as: ZOFRAN -ODT Take 4 mg by mouth every 8 (eight) hours as needed.   pantoprazole  40 MG tablet Commonly known as: PROTONIX  Take 1 tablet (40 mg total) by mouth daily.   predniSONE  10 MG tablet Commonly known as: DELTASONE  Take 1 tablet (10 mg total) by mouth daily with breakfast. Start taking on: March 26, 2024   sennosides-docusate sodium  8.6-50 MG tablet Commonly known as: SENOKOT-S Take 1 tablet by mouth 2 (two) times daily.    sertraline  50 MG tablet Commonly known as: ZOLOFT  Take 50 mg by mouth at bedtime.   spironolactone  25 MG tablet Commonly known as: ALDACTONE  Take 25 mg by mouth daily.   timolol  0.5 % ophthalmic solution Commonly known as: BETIMOL  INSTILL 1 DROP INTO AFFECTED EYE(S) BY OPHTHALMIC ROUTE 2 TIMES PER DAY   tiZANidine  4 MG tablet Commonly known as: ZANAFLEX  Take 4 mg by mouth 3 (three) times daily as needed for muscle spasms.   vitamin D3 25 MCG tablet Commonly known as: CHOLECALCIFEROL  Take 1 tablet (1,000 Units total) by mouth daily.        Contact information for follow-up providers     [facility]. Schedule an appointment as soon as possible for a visit in 1 week(s).   Contact information: [address] [place] [address] [phone]              Contact information for after-discharge care     Destination     [facility] .   Service: Skilled Nursing Contact information: [address] [place]   [address] [phone]                    Discharge Exam: Fredricka Weights   03/19/24 0455  Weight: 45.6 kg   General. Frail and malnourished elderly lady, in no acute distress. Pulmonary.  Lungs clear bilaterally, normal respiratory effort. CV.  Regular rate and rhythm, no JVD, rub or murmur. Abdomen.  Soft, nontender, nondistended, BS positive. CNS.  Alert and oriented .  No focal neurologic deficit. Extremities.  No edema, no cyanosis, pulses intact and symmetrical. Psychiatry.  Judgment and insight appears normal.   Condition at discharge: stable  The results of significant diagnostics from this hospitalization (including imaging, microbiology, ancillary and laboratory) are listed below for reference.   Imaging Studies: DG Chest Port 1 View Result Date: 03/20/2024 CLINICAL DATA:  Dyspnea and respiratory distress. EXAM: PORTABLE CHEST 1 VIEW COMPARISON:  Chest radiograph 03/18/2024.  CT chest  01/18/2024. FINDINGS: Lungs are slightly hyperinflated. Similar appearance of streaky opacities in the left lung base. Trace right pleural effusion new from prior. Atelectasis in the right lung base. No pneumothorax. Stable appearance of the cardiomediastinal silhouette. Aortic arch calcifications. Visualized osseous structures unremarkable. Hiatal hernia. IMPRESSION: Trace right pleural effusion is new from prior. Right basilar opacities favored to reflect atelectasis. Similar streaky opacities in left lung base, atelectasis versus minimal infiltrate. Electronically Signed   By: Donnice Mania M.D.   On: 03/20/2024 20:16   DG Chest Port 1 View Result Date: 03/18/2024 CLINICAL DATA:  Possible sepsis respiratory  distress EXAM: PORTABLE CHEST 1 VIEW COMPARISON:  01/18/2024 FINDINGS: Streaky atelectasis or minimal infiltrate left base. No pleural effusion. Stable cardiomediastinal silhouette. Moderate hiatal hernia. Aortic atherosclerosis. IMPRESSION: Streaky atelectasis or minimal infiltrate at the left base. Moderate hiatal hernia. Electronically Signed   By: Luke Bun M.D.   On: 03/18/2024 22:07    Microbiology: Results for orders placed or performed during the hospital encounter of 03/18/24  Resp panel by RT-PCR (RSV, Flu A&B, Covid) Anterior Nasal Swab     Status: None   Collection Time: 03/18/24  9:46 PM   Specimen: Anterior Nasal Swab  Result Value Ref Range Status   SARS Coronavirus 2 by RT PCR NEGATIVE NEGATIVE Final    Comment: (NOTE) SARS-CoV-2 target nucleic acids are NOT DETECTED.  The SARS-CoV-2 RNA is generally detectable in upper respiratory specimens during the acute phase of infection. The lowest concentration of SARS-CoV-2 viral copies this assay can detect is 138 copies/mL. A negative result does not preclude SARS-Cov-2 infection and should not be used as the sole basis for treatment or other patient management decisions. A negative result may occur with  improper specimen  collection/handling, submission of specimen other than nasopharyngeal swab, presence of viral mutation(s) within the areas targeted by this assay, and inadequate number of viral copies(<138 copies/mL). A negative result must be combined with clinical observations, patient history, and epidemiological information. The expected result is Negative.  Fact Sheet for Patients:  BloggerCourse.com  Fact Sheet for Healthcare Providers:  SeriousBroker.it  This test is no t yet approved or cleared by the [place]  FDA and  has been authorized for detection and/or diagnosis of SARS-CoV-2 by FDA under an Emergency Use Authorization (EUA). This EUA will remain  in effect (meaning this test can be used) for the duration of the COVID-19 declaration under Section 564(b)(1) of the Act, 21 U.S.C.section 360bbb-3(b)(1), unless the authorization is terminated  or revoked sooner.       Influenza A by PCR NEGATIVE NEGATIVE Final   Influenza B by PCR NEGATIVE NEGATIVE Final    Comment: (NOTE) The Xpert Xpress SARS-CoV-2/FLU/RSV plus assay is intended as an aid in the diagnosis of influenza from Nasopharyngeal swab specimens and should not be used as a sole basis for treatment. Nasal washings and aspirates are unacceptable for Xpert Xpress SARS-CoV-2/FLU/RSV testing.  Fact Sheet for Patients: BloggerCourse.com  Fact Sheet for Healthcare Providers: SeriousBroker.it  This test is not yet approved or cleared by the [place]  FDA and has been authorized for detection and/or diagnosis of SARS-CoV-2 by FDA under an Emergency Use Authorization (EUA). This EUA will remain in effect (meaning this test can be used) for the duration of the COVID-19 declaration under Section 564(b)(1) of the Act, 21 U.S.C. section 360bbb-3(b)(1), unless the authorization is terminated or revoked.     Resp Syncytial  Virus by PCR NEGATIVE NEGATIVE Final    Comment: (NOTE) Fact Sheet for Patients: BloggerCourse.com  Fact Sheet for Healthcare Providers: SeriousBroker.it  This test is not yet approved or cleared by the [place]  FDA and has been authorized for detection and/or diagnosis of SARS-CoV-2 by FDA under an Emergency Use Authorization (EUA). This EUA will remain in effect (meaning this test can be used) for the duration of the COVID-19 declaration under Section 564(b)(1) of the Act, 21 U.S.C. section 360bbb-3(b)(1), unless the authorization is terminated or revoked.  Performed at [facility], [address] [place], [place] [address]   Blood Culture (routine x 2)  Status: None   Collection Time: 03/18/24  9:46 PM   Specimen: BLOOD LEFT FOREARM  Result Value Ref Range Status   Specimen Description BLOOD LEFT FOREARM  Final   Special Requests   Final    BOTTLES DRAWN AEROBIC AND ANAEROBIC Blood Culture adequate volume   Culture   Final    NO GROWTH 5 DAYS Performed at [facility], [address] [place], [place] [address]    Report Status 03/23/2024 FINAL  Final  Respiratory (~20 pathogens) panel by PCR     Status: None   Collection Time: 03/19/24  5:05 PM   Specimen: Nasopharyngeal Swab; Respiratory  Result Value Ref Range Status   Adenovirus NOT DETECTED NOT DETECTED Final   Coronavirus 229E NOT DETECTED NOT DETECTED Final    Comment: (NOTE) The Coronavirus on the Respiratory Panel, DOES NOT test for the novel  Coronavirus (2019 nCoV)    Coronavirus HKU1 NOT DETECTED NOT DETECTED Final   Coronavirus NL63 NOT DETECTED NOT DETECTED Final   Coronavirus OC43 NOT DETECTED NOT DETECTED Final   Metapneumovirus NOT DETECTED NOT DETECTED Final   Rhinovirus / Enterovirus NOT DETECTED NOT DETECTED Final   Influenza A NOT DETECTED NOT DETECTED Final   Influenza B NOT DETECTED NOT DETECTED Final    Parainfluenza Virus 1 NOT DETECTED NOT DETECTED Final   Parainfluenza Virus 2 NOT DETECTED NOT DETECTED Final   Parainfluenza Virus 3 NOT DETECTED NOT DETECTED Final   Parainfluenza Virus 4 NOT DETECTED NOT DETECTED Final   Respiratory Syncytial Virus NOT DETECTED NOT DETECTED Final   Bordetella pertussis NOT DETECTED NOT DETECTED Final   Bordetella Parapertussis NOT DETECTED NOT DETECTED Final   Chlamydophila pneumoniae NOT DETECTED NOT DETECTED Final   Mycoplasma pneumoniae NOT DETECTED NOT DETECTED Final    Comment: Performed at [facility], [address] [place], [place] [address]  Culture, blood (Routine X 2) w Reflex to ID Panel     Status: None   Collection Time: 03/19/24 11:26 PM   Specimen: BLOOD  Result Value Ref Range Status   Specimen Description BLOOD BLOOD RIGHT ARM  Final   Special Requests   Final    BOTTLES DRAWN AEROBIC AND ANAEROBIC Blood Culture results may not be optimal due to an inadequate volume of blood received in culture bottles   Culture   Final    NO GROWTH 5 DAYS Performed at [facility], [address], [place], [place] [address]    Report Status 03/24/2024 FINAL  Final  Culture, blood (Routine X 2) w Reflex to ID Panel     Status: None   Collection Time: 03/19/24 11:26 PM   Specimen: BLOOD  Result Value Ref Range Status   Specimen Description BLOOD BLOOD RIGHT HAND  Final   Special Requests Blood Culture adequate volume  Final   Culture   Final    NO GROWTH 5 DAYS Performed at [facility], [address] [place], [place] [address]    Report Status 03/24/2024 FINAL  Final  Gastrointestinal Panel by PCR , Stool     Status: None   Collection Time: 03/24/24  1:25 AM   Specimen: Stool  Result Value Ref Range Status   Campylobacter species NOT DETECTED NOT DETECTED Final   Plesimonas shigelloides NOT DETECTED NOT DETECTED Final   Salmonella species NOT DETECTED NOT DETECTED Final   Yersinia enterocolitica NOT DETECTED  NOT DETECTED Final   Vibrio species NOT DETECTED NOT DETECTED Final  Vibrio cholerae NOT DETECTED NOT DETECTED Final   Enteroaggregative E coli (EAEC) NOT DETECTED NOT DETECTED Final   Enteropathogenic E coli (EPEC) NOT DETECTED NOT DETECTED Final   Enterotoxigenic E coli (ETEC) NOT DETECTED NOT DETECTED Final   Shiga like toxin producing E coli (STEC) NOT DETECTED NOT DETECTED Final   Shigella/Enteroinvasive E coli (EIEC) NOT DETECTED NOT DETECTED Final   Cryptosporidium NOT DETECTED NOT DETECTED Final   Cyclospora cayetanensis NOT DETECTED NOT DETECTED Final   Entamoeba histolytica NOT DETECTED NOT DETECTED Final   Giardia lamblia NOT DETECTED NOT DETECTED Final   Adenovirus F40/41 NOT DETECTED NOT DETECTED Final   Astrovirus NOT DETECTED NOT DETECTED Final   Norovirus GI/GII NOT DETECTED NOT DETECTED Final   Rotavirus A NOT DETECTED NOT DETECTED Final   Sapovirus (I, II, IV, and V) NOT DETECTED NOT DETECTED Final    Comment: Performed at [facility], [address] [place], [place] [address]    Labs: CBC: Recent Labs  Lab 03/18/24 2146 03/20/24 0329 03/21/24 0340 03/22/24 0408 03/23/24 0528 03/24/24 0153  WBC 1.0* 2.7* 2.9* 4.1 5.8 9.9  NEUTROABS 0.6*  --   --   --   --   --   HGB 9.5* 7.7* 7.3* 7.6* 7.6* 7.9*  HCT 30.2* 25.3* 23.6* 25.0* 25.3* 26.6*  MCV 87.8 89.1 87.7 88.7 90.4 91.1  PLT 158 323 502* 674* 629* 812*   Basic Metabolic Panel: Recent Labs  Lab 03/19/24 0507 03/19/24 1803 03/20/24 0329 03/20/24 1924 03/21/24 0340 03/22/24 0408 03/23/24 0528 03/24/24 0153  NA 133*  --  138 138 139 141 139 139  K 2.7*   < > 3.7 3.1* 3.1* 3.9 3.9 4.0  CL 91*  --  102 102 101 103 105 105  CO2 31  --  28 25 28 30 28 28   GLUCOSE 91  --  142* 203* 175* 168* 123* 109*  BUN 19  --  17 21 21  26* 28* 35*  CREATININE 1.05*  --  1.06* 1.31* 1.46* 1.61* 1.31* 1.04*  CALCIUM  7.9*  --  8.0* 8.0* 7.7* 8.1* 8.0* 8.3*  MG 1.5*  --  2.3  --  1.9  --   --   --   PHOS  2.9  --   --   --   --   --   --   --    < > = values in this interval not displayed.   Liver Function Tests: Recent Labs  Lab 03/18/24 2146 03/19/24 0507  AST 23 29  ALT 10 6  ALKPHOS 73 49  BILITOT 1.0 0.7  PROT 6.6 4.9*  ALBUMIN 3.2* 2.4*   CBG: Recent Labs  Lab 03/24/24 1123 03/24/24 1632 03/24/24 2039 03/24/24 2219 03/25/24 0737  GLUCAP 136* 229* 225* 169* 102*    Discharge time spent: greater than 30 minutes.  This record has been created using Conservation officer, historic buildings. Errors have been sought and corrected,but may not always be located. Such creation errors do not reflect on the standard of care.   Signed: Amaryllis Dare, MD [facility] 03/25/2024

## 2024-03-26 LAB — MISC LABCORP TEST (SEND OUT)
LabCorp test name: 123220
Labcorp test code: 123220

## 2024-04-14 NOTE — Progress Notes (Signed)
 Chief Complaint: Chief Complaint  Patient presents with  . Follow-up    losed intra-articular fracture of distal end of left radius    Reason for Visit: [name] is a 83 y.o. female who presents today for re-evaluation of her left wrist injury.  Patient had initially seen myself back on 02/21/2024 for a left FOOSH wrist injury where she had a distal radius and ulna fracture.  The patient was placed into a cast during her previous visit.  She was supposed to follow-up in middle to late July, but unfortunately had missed her initial follow-up appointment secondarily to being in the hospital.  Patient states that she was admitted for sepsis, and was recently released about a week and or so ago to a rehab facility where she has been recovering.  She presents today to have her cast removed and her wrist reevaluated under x-ray.  She states overall that it seems to be doing fairly well.  She denies having any true discomfort or pain with it at this juncture.  She states that she does not utilize any pain medications for this problem.  Denies having any numbness, tingling or radiation symptoms.  States that it just feels slightly stiff after having her cast off.   Medications: Current Outpatient Medications  Medication Sig Dispense Refill  . acetaminophen (TYLENOL) 650 MG ER tablet Take 650 mg by mouth every 8 (eight) hours as needed    . apixaban (ELIQUIS) 2.5 mg tablet Take 2.5 mg by mouth 2 (two) times daily    . aspirin 81 mg tablet 1 tab by mouth daily    . atorvastatin (LIPITOR) 80 MG tablet Take 1 tablet by mouth once daily 90 tablet 0  . bisacodyL (DULCOLAX) 10 mg suppository Place 10 mg rectally once daily as needed for Constipation    . blood glucose diagnostic (ONETOUCH ULTRA TEST) test strip 1 each (1 strip total) 3 (three) times daily AS DIRECTED 100 each 5  . calcium citrate-vitamin D3 (CITRACAL+D) 315 mg-5 mcg (200 unit) tablet Take 1 tablet by mouth once daily    . cholecalciferol  (VITAMIN D3) 1000 unit tablet Take 1,000 Units by mouth once daily    . cyanocobalamin (VITAMIN B12) 1000 MCG tablet Take 1,000 mcg by mouth once daily (Patient not taking: Reported on 02/21/2024)    . docusate (COLACE) 100 MG capsule Take 100 mg by mouth 2 (two) times daily    . DULoxetine (CYMBALTA) 30 MG DR capsule Take 1 capsule by mouth once daily 30 capsule 0  . ferrous sulfate 325 (65 FE) MG tablet Take 325 mg by mouth daily with breakfast    . folic acid (FOLVITE) 1 MG tablet Take 1 tablet by mouth once daily 90 tablet 1  . FUROsemide (LASIX) 20 MG tablet Take 1 tablet (20 mg total) by mouth once daily as needed for Edema 30 tablet 1  . gabapentin (NEURONTIN) 100 MG capsule TAKE 2 CAPSULES BY MOUTH THREE TIMES DAILY 180 capsule 0  . hydroCHLOROthiazide (HYDRODIURIL) 25 MG tablet Take 1 tablet (25 mg total) by mouth once daily (Patient not taking: Reported on 02/21/2024) 30 tablet 11  . HYDROcodone-acetaminophen (NORCO) 5-325 mg tablet Take 1 tablet by mouth every 6 (six) hours as needed    . lancets (ONETOUCH DELICA PLUS LANCET) 3 (three) times daily Use as instructed. (Patient not taking: Reported on 02/21/2024) 100 each 5  . latanoprost (XALATAN) 0.005 % ophthalmic solution     . metFORMIN (GLUCOPHAGE) 500 MG  tablet TAKE 1 TABLET BY MOUTH TWICE DAILY WITH MEALS 180 tablet 0  . methotrexate (RHEUMATREX) 2.5 MG tablet TAKE 5 TABLETS BY MOUTH ONCE A WEEK 20 tablet 5  . mirtazapine (REMERON) 15 MG tablet Take 1 tablet by mouth nightly 30 tablet 0  . ONETOUCH ULTRA2 METER kit Please use to test BS 3 times a day as directed. (Patient not taking: Reported on 02/21/2024) 1 each 0  . pantoprazole (PROTONIX) 40 MG DR tablet Take 40 mg by mouth once daily    . potassium chloride (KLOR-CON) 10 MEQ ER tablet TAKE 1  BY MOUTH ONCE DAILY 90 tablet 0  . predniSONE (DELTASONE) 10 MG tablet Taper 6-6-4-4-2-2-1-1-off 26 tablet 0  . sennosides-docusate (SENOKOT-S) 8.6-50 mg tablet Take 1 tablet by mouth 2 (two)  times daily    . sertraline (ZOLOFT) 50 MG tablet Take 1 tablet by mouth once daily 90 tablet 0  . spironolactone (ALDACTONE) 25 MG tablet Take 25 mg by mouth once daily    . timoloL maleate (TIMOPTIC) 0.5 % ophthalmic solution Place 1 drop into both eyes once daily (Patient not taking: Reported on 02/21/2024)    . tiZANidine (ZANAFLEX) 4 MG tablet Take 1 tablet (4 mg total) by mouth 3 (three) times daily as needed (Patient not taking: Reported on 02/21/2024) 30 tablet 5  . traMADoL (ULTRAM) 50 mg tablet Take 50 mg by mouth 2 (two) times daily as needed for Pain (Patient not taking: Reported on 02/21/2024)     No current facility-administered medications for this visit.    Allergies: Allergies  Allergen Reactions  . Codeine Nausea  . Remicade [Infliximab] Other (See Comments)    TOE INFECTION    Past Medical History: Past Medical History:  Diagnosis Date  . Cellulitis of foot    occurring on immunosuppression  . Colon polyp   . Diabetes mellitus type 2, uncomplicated (CMS/HHS-HCC)   . GERD (gastroesophageal reflux disease)   . Gout   . Hemorrhagic cerebrovascular accident (CVA) (CMS/HHS-HCC)    x 2 ([facility]) a. Vertebral artery narrowing.    [name] History of bone density study 07/29/2009  . History of bone density study 09/01/2011  . History of bone density study 09/02/2013  . Hyperlipidemia   . Hypertension   . Osteoarthritis    a. Cervical disc disease  b. Lumbar disc disease    . Osteoporosis, post-menopausal   . Reflux   . Rheumatoid arthritis(714.0) (CMS/HHS-HCC)    a. Positive rheumatoid factor.  b. Elevated sed rate.  c. Atypical oligo-articular inflammatory arthritis.  d. Hip synovitis.  e. s/p Remicade, stopped because of infection 10/2005 ([facility])   . Rosacea   . Stroke (CMS/HHS-HCC)   . Temporal arteritis (CMS/HHS-HCC)   . Temporal arteritis (CMS/HHS-HCC)     Past Surgical History: Past Surgical History:  Procedure Laterality Date  . ARTHROPLASTY BIPOLAR HIP  (HEMIARTHROPLASTY) Right 05/21/2003   by Dr Edie  . COLONOSCOPY  01/31/2005   FH Colon Polyps (Mother)  . LAMINECTOMY POSTERIOR LUMBAR FACETECTOMY & FORAMINOTOMY W/DECOMP N/A 04/23/2013   Procedure: *L3 and L4 laminectomies*;  Surgeon: Eric Westley Sayers, MD;  Location: [facility];  Service: Neurosurgery;  Laterality: N/A;  . LAMINECTOMY POSTERIOR CERVICLE DECOMP W/FACETECTOMY & FORAMINOTOMY N/A 04/23/2013   Procedure: addtional lumbar lami;  Surgeon: Eric Westley Sayers, MD;  Location: [facility];  Service: Neurosurgery;  Laterality: N/A;  . COLONOSCOPY  01/27/2014   FH Colon Polyps (Mother): [facility] 01/2019  . EGD  01/27/2014  No repeat per RTE  . 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 EGD  04/24/2018   Intestinal Metaplasia/Repeat 56yrs/[facility]  . EGD  07/01/2021   Normal EGD/Hiatal Hernia/Repeat as needed/[facility]  . ARTHROPLASTY BIPOLAR HIP (HEMIARTHROPLASTY) Right 05/21/2023   Dr Edie  . APPENDECTOMY    . BREAST BIOPSY    . FOOT SURGERY    . HEMORRHOID SURGERY    . HIGH BLOOD PRESSURE    . HYSTERECTOMY     w/o oophorectomy  . KNEE ARTHROSCOPY Right   . NOSE SURGERY    . TONSILLECTOMY    . tonsils    . URETER SURGERY      Social History: Social History   Socioeconomic History  . Marital status: Married  Tobacco Use  . Smoking status: Never  . Smokeless tobacco: Never  Vaping Use  . Vaping status: Never Used  Substance and Sexual Activity  . Alcohol use: No  . Drug use: No  . Sexual activity: Defer   Social Drivers of Health   Food Insecurity: Patient Unable To Answer (03/19/2024)   Received from [facility]   Hunger Vital Sign   . Within the past 12 months, you worried that your food would run out before you got the money to buy more.: Patient unable to answer   . Within the past 12 months, the food you bought just didn't last and you didn't have money to get more.: Patient unable to answer  Recent Concern: Food  Insecurity - Food Insecurity Present (01/19/2024)   Received from [facility]   Hunger Vital Sign   . Within the past 12 months, you worried that your food would run out before you got the money to buy more.: Sometimes true   . Within the past 12 months, the food you bought just didn't last and you didn't have money to get more.: Sometimes true  Transportation Needs: No Transportation Needs (01/19/2024)   Received from [facility] - Transportation   . Lack of Transportation (Medical): No   . Lack of Transportation (Non-Medical): No  Social Connections: Socially Isolated (01/19/2024)   Received from [facility]   Social Connection and Isolation Panel   . In a typical week, how many times do you talk on the phone with family, friends, or neighbors?: Twice a week   . How often do you get together with friends or relatives?: Once a week   . How often do you attend church or religious services?: Never   . Do you belong to any clubs or organizations such as church groups, unions, fraternal or athletic groups, or school groups?: No   . How often do you attend meetings of the clubs or organizations you belong to?: Never   . Are you married, widowed, divorced, separated, never married, or living with a partner?: Widowed  Housing Stability: Unknown (02/21/2024)   Housing Stability Vital Sign   . Homeless in the Last Year: No    Family History: Family History  Problem Relation Name Age of Onset  . Stroke Father    . Diabetes type II Father    . Alzheimer's disease Mother    . High blood pressure (Hypertension) Mother    . Stroke Other S[place]LING   . Diabetes type II Other S[place]LING   . Anesthesia problems Neg Hx      Review of Systems: A comprehensive 14 point ROS was performed, reviewed, and the pertinent orthopaedic findings are documented  in the HPI.  Physical Exam Vitals:   04/12/24 1427  BP: 132/82  Weight: 49.9 kg (110 lb)  Height: 162.6 cm (5' 4)  PainSc: 0-No pain  PainLoc:  Wrist   General/Constitutional: The patient appears to be well-nourished, well-developed, and in no acute distress. Neuro/Psych: Normal mood and affect, oriented to person, place and time. Eyes: Non-icteric.  Pupils are equal, round, and reactive to light, and exhibit synchronous movement. Lymphatic: No palpable adenopathy. Respiratory: Normal chest excursion and Non-labored breathing Cardiovascular: No edema, swelling or tenderness, except as noted in detailed exam. Vascular: No edema, swelling or tenderness, except as noted in detailed exam. Integumentary: No impressive skin lesions present, except as noted in detailed exam. Musculoskeletal: See left wrist exam below BP 132/82   Ht 162.6 cm (5' 4)   Wt 49.9 kg (110 lb)   LMP  (LMP Unknown)   BMI 18.88 kg/m   Left wrist exam: Upon inspection of the patient's left wrist, there is good resolve of previously documented ecchymosis and mild swelling.  Patient not noted to have any noticeable deformity or open laceration.  Denies any tenderness along any margin of her dorsal or volar wrist.No scaphoid tenderness appreciated.  Slightly reduced range of motion noted with flexion and extension of the wrist.  Able to flex and extend at her elbow with normal ROM and no discomfort.  Able to make a fist and extend her fingers with normal range of motion and little discomfort noted at the wrist.  Reports being neurovascularly intact to all dermatomes extending down her left upper extremity and into her left hand.  Radial pulses appreciated, 2+.  Cap refill less than 3 seconds in each finger.  Imaging: A series of x-rays were ordered and interpreted of the patient's left radius and ulna.  These images included AP and lateral views.  Upon inspection, there does appear to be still some presence of the comminuted fracture of the distal radius with minimal displacement of the lateral radial margins, but fracture lines are much more difficult to discern.  Good  callus formation with early signs of remodeling present.  Small minimally displaced avulsion fracture of the ulnar styloid noted as well.  DRUJ appears intact.  No scaphoid involvement appreciated.  No other fractures, lytic lesions or gross deformities appreciated on films.  Impression: Left, closed, comminuted distal radius fracture Left avulsion fracture of ulnar styloid with minimal displacement  Plan:   The findings were discussed in detail with the patient. A series of x-rays were ordered interpreted of the patient's left wrist.  The images and findings were discussed with the patient Patient was taken out of her cast Discussed continued activity modification and working hard in her nursing facility Plan to follow-up as needed  SPECIAL EQUIPMENT: None SPECIAL STUDIES: None ACTIVITIES:  As tolerated. WORK STATUS: Not applicable. THERAPY: None MEDICATIONS: Requested Prescriptions    No prescriptions requested or ordered in this encounter   FOLLOW-UP: No follow-ups on file.  I personally spent a total of 25 minutes reviewing the patient's chart, interpreting labs/imaging studies, interaction with, and development of a treatment plan during this patient's visit.  Fonda Dallas Koyanagi, [place]  Fonda CHARLENA Koyanagi, PA-C [facility] 04/12/2024 There are other unrelated non-urgent complaints, but due to the busy schedule and the amount of time I've already spent with her, time does not permit me to address these routine issues at today's visit. I've requested another appointment to review these additional issues.

## 2024-07-13 DEATH — deceased
